# Patient Record
Sex: Male | Born: 1940 | Race: White | Hispanic: No | Marital: Married | Smoking: Former smoker
Health system: Southern US, Community
[De-identification: ages and names within clinical notes are randomized; demographics above are authoritative.]

## PROBLEM LIST (undated history)

## (undated) DIAGNOSIS — N259 Disorder resulting from impaired renal tubular function, unspecified: Secondary | ICD-10-CM

## (undated) DIAGNOSIS — C801 Malignant (primary) neoplasm, unspecified: Secondary | ICD-10-CM

## (undated) DIAGNOSIS — I6350 Cerebral infarction due to unspecified occlusion or stenosis of unspecified cerebral artery: Secondary | ICD-10-CM

## (undated) DIAGNOSIS — E785 Hyperlipidemia, unspecified: Secondary | ICD-10-CM

## (undated) DIAGNOSIS — I679 Cerebrovascular disease, unspecified: Secondary | ICD-10-CM

## (undated) DIAGNOSIS — I2510 Atherosclerotic heart disease of native coronary artery without angina pectoris: Secondary | ICD-10-CM

## (undated) DIAGNOSIS — I10 Essential (primary) hypertension: Secondary | ICD-10-CM

## (undated) DIAGNOSIS — I498 Other specified cardiac arrhythmias: Secondary | ICD-10-CM

## (undated) DIAGNOSIS — E119 Type 2 diabetes mellitus without complications: Secondary | ICD-10-CM

## (undated) DIAGNOSIS — I219 Acute myocardial infarction, unspecified: Secondary | ICD-10-CM

## (undated) HISTORY — DX: Acute myocardial infarction, unspecified: I21.9

## (undated) HISTORY — DX: Hyperlipidemia, unspecified: E78.5

## (undated) HISTORY — DX: Atherosclerotic heart disease of native coronary artery without angina pectoris: I25.10

## (undated) HISTORY — DX: Type 2 diabetes mellitus without complications: E11.9

## (undated) HISTORY — DX: Malignant (primary) neoplasm, unspecified: C80.1

## (undated) HISTORY — DX: Disorder resulting from impaired renal tubular function, unspecified: N25.9

## (undated) HISTORY — DX: Cerebrovascular disease, unspecified: I67.9

## (undated) HISTORY — PX: CORONARY STENT PLACEMENT: SHX1402

## (undated) HISTORY — DX: Other specified cardiac arrhythmias: I49.8

## (undated) HISTORY — DX: Essential (primary) hypertension: I10

## (undated) HISTORY — DX: Cerebral infarction due to unspecified occlusion or stenosis of unspecified cerebral artery: I63.50

---

## 2000-12-17 ENCOUNTER — Inpatient Hospital Stay (HOSPITAL_COMMUNITY): Admission: EM | Admit: 2000-12-17 | Discharge: 2000-12-23 | Payer: Self-pay | Admitting: Emergency Medicine

## 2000-12-17 ENCOUNTER — Encounter: Payer: Self-pay | Admitting: Emergency Medicine

## 2006-04-21 ENCOUNTER — Encounter (INDEPENDENT_AMBULATORY_CARE_PROVIDER_SITE_OTHER): Payer: Self-pay | Admitting: *Deleted

## 2006-04-21 ENCOUNTER — Ambulatory Visit (HOSPITAL_COMMUNITY): Admission: RE | Admit: 2006-04-21 | Discharge: 2006-04-21 | Payer: Self-pay | Admitting: Gastroenterology

## 2006-11-19 ENCOUNTER — Inpatient Hospital Stay (HOSPITAL_COMMUNITY): Admission: EM | Admit: 2006-11-19 | Discharge: 2006-11-24 | Payer: Self-pay | Admitting: Emergency Medicine

## 2006-11-20 ENCOUNTER — Encounter: Payer: Self-pay | Admitting: Cardiology

## 2006-11-20 ENCOUNTER — Ambulatory Visit: Payer: Self-pay | Admitting: Cardiology

## 2006-11-20 ENCOUNTER — Ambulatory Visit: Payer: Self-pay | Admitting: Vascular Surgery

## 2006-11-20 ENCOUNTER — Encounter (INDEPENDENT_AMBULATORY_CARE_PROVIDER_SITE_OTHER): Payer: Self-pay | Admitting: Neurology

## 2006-11-23 ENCOUNTER — Ambulatory Visit: Payer: Self-pay | Admitting: Physical Medicine & Rehabilitation

## 2006-12-18 ENCOUNTER — Encounter: Admission: RE | Admit: 2006-12-18 | Discharge: 2006-12-30 | Payer: Self-pay | Admitting: Neurology

## 2006-12-28 ENCOUNTER — Ambulatory Visit: Payer: Self-pay | Admitting: Cardiology

## 2007-01-04 ENCOUNTER — Ambulatory Visit: Payer: Self-pay

## 2007-07-14 ENCOUNTER — Ambulatory Visit: Payer: Self-pay | Admitting: Cardiology

## 2007-08-06 ENCOUNTER — Ambulatory Visit: Payer: Self-pay

## 2007-08-06 ENCOUNTER — Ambulatory Visit: Payer: Self-pay | Admitting: Cardiology

## 2007-08-06 LAB — CONVERTED CEMR LAB
ALT: 23 units/L (ref 0–53)
Alkaline Phosphatase: 44 units/L (ref 39–117)
Cholesterol: 175 mg/dL (ref 0–200)
Total Bilirubin: 0.6 mg/dL (ref 0.3–1.2)
Total CHOL/HDL Ratio: 5.5
VLDL: 47 mg/dL — ABNORMAL HIGH (ref 0–40)

## 2007-11-25 ENCOUNTER — Inpatient Hospital Stay (HOSPITAL_COMMUNITY): Admission: EM | Admit: 2007-11-25 | Discharge: 2007-11-28 | Payer: Self-pay | Admitting: Emergency Medicine

## 2007-11-25 ENCOUNTER — Ambulatory Visit: Payer: Self-pay | Admitting: Cardiology

## 2007-12-08 ENCOUNTER — Encounter: Admission: RE | Admit: 2007-12-08 | Discharge: 2007-12-08 | Payer: Self-pay | Admitting: Cardiology

## 2007-12-13 ENCOUNTER — Ambulatory Visit: Payer: Self-pay | Admitting: Cardiology

## 2008-01-11 ENCOUNTER — Ambulatory Visit: Payer: Self-pay

## 2008-01-11 ENCOUNTER — Ambulatory Visit: Payer: Self-pay | Admitting: Cardiology

## 2008-01-11 LAB — CONVERTED CEMR LAB
ALT: 27 units/L (ref 0–53)
AST: 23 units/L (ref 0–37)
Alkaline Phosphatase: 65 units/L (ref 39–117)
Bilirubin, Direct: 0.1 mg/dL (ref 0.0–0.3)
HDL: 23.5 mg/dL — ABNORMAL LOW (ref >39.0)
Total Bilirubin: 0.7 mg/dL (ref 0.3–1.2)

## 2008-06-09 ENCOUNTER — Ambulatory Visit: Payer: Self-pay | Admitting: Cardiology

## 2008-12-11 DIAGNOSIS — R0989 Other specified symptoms and signs involving the circulatory and respiratory systems: Secondary | ICD-10-CM

## 2008-12-11 DIAGNOSIS — I200 Unstable angina: Secondary | ICD-10-CM

## 2008-12-11 DIAGNOSIS — I2510 Atherosclerotic heart disease of native coronary artery without angina pectoris: Secondary | ICD-10-CM | POA: Insufficient documentation

## 2008-12-11 DIAGNOSIS — N259 Disorder resulting from impaired renal tubular function, unspecified: Secondary | ICD-10-CM

## 2008-12-11 DIAGNOSIS — I6350 Cerebral infarction due to unspecified occlusion or stenosis of unspecified cerebral artery: Secondary | ICD-10-CM | POA: Insufficient documentation

## 2008-12-11 DIAGNOSIS — I498 Other specified cardiac arrhythmias: Secondary | ICD-10-CM

## 2008-12-11 DIAGNOSIS — I219 Acute myocardial infarction, unspecified: Secondary | ICD-10-CM | POA: Insufficient documentation

## 2008-12-11 DIAGNOSIS — R7309 Other abnormal glucose: Secondary | ICD-10-CM

## 2008-12-12 ENCOUNTER — Encounter: Payer: Self-pay | Admitting: Cardiology

## 2008-12-12 ENCOUNTER — Ambulatory Visit: Payer: Self-pay | Admitting: Cardiology

## 2008-12-12 DIAGNOSIS — R011 Cardiac murmur, unspecified: Secondary | ICD-10-CM

## 2008-12-27 ENCOUNTER — Ambulatory Visit: Payer: Self-pay | Admitting: Cardiology

## 2008-12-27 ENCOUNTER — Ambulatory Visit: Payer: Self-pay

## 2008-12-27 ENCOUNTER — Encounter (INDEPENDENT_AMBULATORY_CARE_PROVIDER_SITE_OTHER): Payer: Self-pay | Admitting: *Deleted

## 2008-12-27 ENCOUNTER — Encounter: Payer: Self-pay | Admitting: Cardiology

## 2009-12-10 ENCOUNTER — Telehealth: Payer: Self-pay | Admitting: Cardiology

## 2009-12-31 ENCOUNTER — Ambulatory Visit: Payer: Self-pay | Admitting: Cardiology

## 2009-12-31 DIAGNOSIS — I679 Cerebrovascular disease, unspecified: Secondary | ICD-10-CM

## 2010-01-24 ENCOUNTER — Ambulatory Visit: Payer: Self-pay | Admitting: Cardiology

## 2010-01-24 ENCOUNTER — Ambulatory Visit: Payer: Self-pay

## 2010-01-25 ENCOUNTER — Encounter (INDEPENDENT_AMBULATORY_CARE_PROVIDER_SITE_OTHER): Payer: Self-pay | Admitting: *Deleted

## 2010-04-09 ENCOUNTER — Telehealth: Payer: Self-pay | Admitting: Cardiology

## 2010-09-15 ENCOUNTER — Encounter: Payer: Self-pay | Admitting: Neurology

## 2010-09-22 LAB — CONVERTED CEMR LAB
Albumin: 3.7 g/dL (ref 3.5–5.2)
Alkaline Phosphatase: 69 units/L (ref 39–117)
BUN: 32 mg/dL — ABNORMAL HIGH (ref 6–23)
Bilirubin, Direct: 0.1 mg/dL (ref 0.0–0.3)
Bilirubin, Direct: 0.1 mg/dL (ref 0.0–0.3)
CO2: 26 meq/L (ref 19–32)
CO2: 27 meq/L (ref 19–32)
Chloride: 113 meq/L — ABNORMAL HIGH (ref 96–112)
Creatinine, Ser: 1.3 mg/dL (ref 0.4–1.5)
Creatinine, Ser: 1.8 mg/dL — ABNORMAL HIGH (ref 0.4–1.5)
Glucose, Bld: 214 mg/dL — ABNORMAL HIGH (ref 70–99)
Potassium: 4 meq/L (ref 3.5–5.1)
Sodium: 142 meq/L (ref 135–145)
Sodium: 146 meq/L — ABNORMAL HIGH (ref 135–145)
Total Bilirubin: 0.7 mg/dL (ref 0.3–1.2)
Total CHOL/HDL Ratio: 3
Total CHOL/HDL Ratio: 4
Total CK: 104 units/L (ref 7–232)
Total Protein: 6.6 g/dL (ref 6.0–8.3)
Total Protein: 7.2 g/dL (ref 6.0–8.3)
Triglycerides: 145 mg/dL (ref 0.0–149.0)

## 2010-09-24 NOTE — Assessment & Plan Note (Signed)
Summary: [name] [name]   Primary Provider:  Dr. Donnella Bi  CC:  no complaints.  History of Present Illness: 70 year old male  who has a history of coronary artery  disease, SVT, hypertension, and hyperlipidemia. Last cardiac catheterization in April of 2009; at that time he was found to have a 40% mid LAD, 70% ostial OM and a 90% stenosis in the mid right coronary artery followed by a 70% stenosis in the distal right coronary artery. Patient had drug-eluting stents to both lesions in the right coronary artery successfully. Last echocardiogram in May of 2010 and showed normal LV function. Carotid Dopplers in May of 2010 showed 40-59% bilateral stenosis. Followup was recommended in one year. Since I last saw him in April of 2010 he has dyspnea with more extreme activities but not with routine activities. Relieved promptly with rest. It is not associated with chest pain. The patient is not having exertional chest pain. There is no orthopnea, PND; mild pedal edema. He has not had syncope.  Current Medications (verified): 1)  Amlodipine Besylate 5 Mg Tabs (Amlodipine Besylate) .... Take 1 Tablet By Mouth Once A Day 2)  Niaspan 1000 Mg Cr-Tabs (Niacin (Antihyperlipidemic)) .Marland Kitchen.. 1 Tab By Mouth Once Daily 3)  Simvastatin 80 Mg Tabs (Simvastatin) .... Take 1 Tablet By Mouth Once A Day 4)  Metoprolol Succinate 50 Mg Xr24h-Tab (Metoprolol Succinate) .... Take One Tablet By Mouth Daily 5)  Plavix 75 Mg Tabs (Clopidogrel Bisulfate) .... Take One Tablet By Mouth Daily 6)  Glipizide 5 Mg Tabs (Glipizide) .Marland Kitchen.. 1 Tab By Mouth Once Daily 7)  Aspirin 81 Mg Tbec (Aspirin) .... Take One Tablet By Mouth Daily 8)  Lantus 25units .... At Bedtime  Past History:  Past Medical History: RENAL INSUFFICIENCY (ICD-588.9) DIABETES MELLITUS (ICD-250.00) cerebrovascular disease CEREBROVASCULAR ACCIDENT (ICD-434.91) HYPERLIPIDEMIA (ICD-272.4) HYPERTENSION (ICD-401.9) SUPRAVENTRICULAR TACHYCARDIA (ICD-427.89) CAD (ICD-414.00)  Past  Surgical History: Reviewed history from 12/11/2008 and no changes required. Status post drug-eluting stents to the mid and distal right coronary artery by Dr. Juanda Chance.   Social History: Reviewed history from 12/11/2008 and no changes required.  Lives with his wife (wife's number is [phone]).  Chewing tobacco. Married   Review of Systems       Complaines of fatigue and pain in legs both with ambulation and at rest but no fevers or chills, productive cough, hemoptysis, dysphasia, odynophagia, melena, hematochezia, dysuria, hematuria, rash, seizure activity, orthopnea, PND,  claudication. Remaining systems are negative.   Vital Signs:  Patient profile:   70 year old male Height:      73 inches Weight:      202 pounds BMI:     26.75 Pulse rate:   69 / minute Resp:     14 per minute BP sitting:   142 / 80  (left arm)  Vitals Entered By: Kem Parkinson (Dec 31, 2009 3:21 PM)  Physical Exam  General:  Well-developed well-nourished in no acute distress.  Skin is warm and dry.  HEENT is normal.  Neck is supple. No thyromegaly.  Chest is clear to auscultation with normal expansion.  Cardiovascular exam is regular rate and rhythm. 2/6 systolic ejection murmur Abdominal exam nontender or distended. No masses palpated. Extremities show trace edema. neuro grossly intact    EKG  Procedure date:  12/31/2009  Findings:      Sinus rhythm at a rate of 69. Axis normal. No ST changes.  Impression & Recommendations:  Problem # 1:  CEREBROVASCULAR DISEASE (ICD-437.9) Continue aspirin and statin. Repeat  carotid Dopplers.  Problem # 2:  RENAL INSUFFICIENCY (ICD-588.9) Renal function monitored by primary care.  Problem # 3:  DIABETES MELLITUS (ICD-250.00)  His updated medication list for this problem includes:    Glipizide 5 Mg Tabs (Glipizide) .Marland Kitchen... 1 tab by mouth once daily    Aspirin 81 Mg Tbec (Aspirin) .Marland Kitchen... Take one tablet by mouth daily  Problem # 4:  HYPERLIPIDEMIA  (ICD-272.4) Continue present medications. Check lipids, liver and CK. His updated medication list for this problem includes:    Niaspan 1000 Mg Cr-tabs (Niacin (antihyperlipidemic)) .Marland Kitchen... 1 tab by mouth once daily    Simvastatin 80 Mg Tabs (Simvastatin) .Marland Kitchen... Take 1 tablet by mouth once a day  Problem # 5:  HYPERTENSION (ICD-401.9) Blood pressure mildly elevated. I've asked him to check his blood pressure at home. We will increase amlodipine if systolic is greater than 130 or diastolic greater than 85. His updated medication list for this problem includes:    Amlodipine Besylate 5 Mg Tabs (Amlodipine besylate) .Marland Kitchen... Take 1 tablet by mouth once a day    Metoprolol Succinate 50 Mg Xr24h-tab (Metoprolol succinate) .Marland Kitchen... Take one tablet by mouth daily    Aspirin 81 Mg Tbec (Aspirin) .Marland Kitchen... Take one tablet by mouth daily  Problem # 6:  CAD (ICD-414.00) Continue aspirin, Plavix, beta blocker and statin. His updated medication list for this problem includes:    Amlodipine Besylate 5 Mg Tabs (Amlodipine besylate) .Marland Kitchen... Take 1 tablet by mouth once a day    Metoprolol Succinate 50 Mg Xr24h-tab (Metoprolol succinate) .Marland Kitchen... Take one tablet by mouth daily    Plavix 75 Mg Tabs (Clopidogrel bisulfate) .Marland Kitchen... Take one tablet by mouth daily    Aspirin 81 Mg Tbec (Aspirin) .Marland Kitchen... Take one tablet by mouth daily  Problem # 7:  SUPRAVENTRICULAR TACHYCARDIA (ICD-427.89) No recurrences. Continue beta blocker. His updated medication list for this problem includes:    Amlodipine Besylate 5 Mg Tabs (Amlodipine besylate) .Marland Kitchen... Take 1 tablet by mouth once a day    Metoprolol Succinate 50 Mg Xr24h-tab (Metoprolol succinate) .Marland Kitchen... Take one tablet by mouth daily    Plavix 75 Mg Tabs (Clopidogrel bisulfate) .Marland Kitchen... Take one tablet by mouth daily    Aspirin 81 Mg Tbec (Aspirin) .Marland Kitchen... Take one tablet by mouth daily  Other Orders: Carotid Duplex (Carotid Duplex)  Patient Instructions: 1)  Your physician recommends that you  schedule a follow-up appointment in:ONE YEAR 2)  Your physician recommends that you return for lab work EA:VWUJ CAROTIDS- BMP/LIPID/LIVER/TOTAL CK-272.0/401.1/V58.69 3)  Your physician has requested that you have a carotid duplex. This test is an ultrasound of the carotid arteries in your neck. It looks at blood flow through these arteries that supply the brain with blood. Allow one hour for this exam. There are no restrictions or special instructions.

## 2010-09-24 NOTE — Letter (Signed)
Summary: Custom - Lipid  [facility], Main Office  [address]   [place], [place] [address]   Phone: [phone]  Fax: [phone]     January 25, 2010 MRN: [id number]   HISAO DOO [address] [place], [place]  [address]   Dear [name] [name],  We have reviewed your cholesterol results.  They are as follows:     Total Cholesterol:    138 (Desirable: less than 200)       HDL  Cholesterol:     36.20  (Desirable: greater than 40 for men and 50 for women)       LDL Cholesterol:       59.0  (Desirable: less than 100 for low risk and less than 70 for moderate to high risk)       Triglycerides:       321.0  (Desirable: less than 150)  Our recommendations include:These numbers look good. Continue on the same medicine. Sodium, potassium, kidney and Liver function are stable. Take care, Dr. Darel Hong.    Call our office at the number listed above if you have any questions.  Lowering your LDL cholesterol is important, but it is only one of a large number of "risk factors" that may indicate that you are at risk for heart disease, stroke or other complications of hardening of the arteries.  Other risk factors include:   A.  Cigarette Smoking* B.  High Blood Pressure* C.  Obesity* D.   Low HDL Cholesterol (see yours above)* E.   Diabetes Mellitus (higher risk if your is uncontrolled) F.  Family history of premature heart disease G.  Previous history of stroke or cardiovascular disease    *These are risk factors YOU HAVE CONTROL OVER.  For more information, visit .  There is now evidence that lowering the TOTAL CHOLESTEROL AND LDL CHOLESTEROL can reduce the risk of heart disease.  The American Heart Association recommends the following guidelines for the treatment of elevated cholesterol:  1.  If there is now current heart disease and less than two risk factors, TOTAL CHOLESTEROL should be less than 200 and LDL CHOLESTEROL should be less than 100. 2.  If there is  current heart disease or two or more risk factors, TOTAL CHOLESTEROL should be less than 200 and LDL CHOLESTEROL should be less than 70.  A diet low in cholesterol, saturated fat, and calories is the cornerstone of treatment for elevated cholesterol.  Cessation of smoking and exercise are also important in the management of elevated cholesterol and preventing vascular disease.  Studies have shown that 30 to 60 minutes of physical activity most days can help lower blood pressure, lower cholesterol, and keep your weight at a healthy level.  Drug therapy is used when cholesterol levels do not respond to therapeutic lifestyle changes (smoking cessation, diet, and exercise) and remains unacceptably high.  If medication is started, it is important to have you levels checked periodically to evaluate the need for further treatment options.  Thank you,    [employer] Team

## 2010-09-24 NOTE — Progress Notes (Signed)
Summary: drug reaction with simvastin - amlopidine   Phone Note Refill Request Message from:  Pharmacy on April 09, 2010 10:35 AM  Refills Requested: Medication #1:  SIMVASTATIN 80 MG TABS Take 1 tablet by mouth once a day  Method Requested: Fax to [facility] Initial call taken by: Lorne Skeens,  April 09, 2010 10:36 AM Caller: [facility]  Request: Speak with Nurse Summary of Call: Narka , [phone] / ref # [id number] / this is a drug reaction with AMLODIPINE BESYLATE 5 MG TABS Take 1 tablet by mouth once a day  Follow-up for Phone Call        will foward for dr Jens Som review Deliah Goody, RN  April 09, 2010 11:11 AM   Additional Follow-up for Phone Call Additional follow up Details #1::        dc zocor; pravachol 80 mg by mouth daily; lipids and liver in six weeks Ferman Hamming, MD, [facility]  April 09, 2010 11:31 AM  new script phoned to [facility], pt aware of change and need for repeat labs Deliah Goody, RN  April 09, 2010 11:47 AM     New/Updated Medications: PRAVASTATIN SODIUM 80 MG TABS (PRAVASTATIN SODIUM) Take one tablet by mouth daily at bedtime

## 2010-09-24 NOTE — Progress Notes (Signed)
Summary: refill  Phone Note Refill Request Message from:  Patient on December 10, 2009 9:22 AM  Refills Requested: Medication #1:  NIASPAN 1000 MG CR-TABS 1 tab by mouth once daily Send to [facility] [phone]  Initial call taken by: Judie Grieve,  December 10, 2009 9:23 AM    Prescriptions: NIASPAN 1000 MG CR-TABS (NIACIN (ANTIHYPERLIPIDEMIC)) 1 tab by mouth once daily  #90 x 3   Entered by:   Kem Parkinson   Authorized by:   Ferman Hamming, MD, [facility]   Signed by:   Kem Parkinson on 12/10/2009   Method used:   Electronically to        [facility]* (mail-order)             ,          Ph: [phone]       Fax: [phone]   RxID:   [id number]

## 2011-01-07 NOTE — Discharge Summary (Signed)
NAME:  [name] [name], [name] [name] NO.:  [id number]   MEDICAL RECORD NO.:  [id number]          PATIENT TYPE:  INP   LOCATION:  4735                         FACILITY:  [facility]   PHYSICIAN:  Bevelyn Buckles. Bensimhon, MDDATE OF BIRTH:  [birth year]   DATE OF ADMISSION:  11/25/2007  DATE OF DISCHARGE:  11/28/2007                         DISCHARGE SUMMARY - REFERRING   PRIMARY CARE PHYSICIAN:  Dr. Electa Sniff   DISCHARGE DIAGNOSES:  1. Acute coronary syndrome.  2. Progressive coronary artery disease.  3. Status post drug-eluting stents to the mid and distal right      coronary artery by Dr. Juanda Chance.  4. Hypertension.  5. Hyperglycemia with a history of diabetes, poorly controlled.  6. Chronic renal insufficiency, appears to be stable post-      catheterization.  7. Tobacco use.  8. Hyperlipidemia history as noted per past medical history.   PROCEDURES:  Cardiac catheterization with drug-eluting stenting  performed to the mid and distal RCA by Dr. Charlies Constable on November 26, 2007.   BRIEF HISTORY:  [name] [name] is a 70 year old white male who presented with  anterior chest pressure associated with shortness of breath that started  shortly after dinner on the evening of admission.  He gave an 8 on a  scale of 0-10.  He took his neighbor's nitroglycerin, but it did not  improve the discomfort, thus he came to the emergency room for further  evaluation.   PAST MEDICAL HISTORY:  1. Diabetes.  2. Hypertension.  3. Hyperlipidemia.  4. Chewing tobacco.  5. History of coronary artery disease with a bare metal stent to the      circumflex in 2002 and PTCA to the OM with Rotablator and cutting      balloon, also in 2002.   ALLERGIES:  PENICILLIN.   LABORATORY DATA:  Admission weight was 91 kg.  On admission, H&H was  12.8 and 37.3, normal indices, platelets 193, WBCs 13.6.  On the 4th,  H&H was 11.1 and 32.3, normal indices, platelets 202, WBCs 10.1.  PTT  was 91 on the 3rd, PT 13.6.  On the  3rd, sodium was 139, potassium 3.9,  BUN 23, creatinine 1.64, glucose 218.  At the time of discharge on the  5th, sodium was 139, potassium 4.0, BUN 19, creatinine 1.90, glucose  219.  CK total was 181 with MB of 16.8 and a troponin of 0.78  postprocedure.   Chest x-ray on the 2nd showed no active disease.  EKG showed sinus  rhythm with septal Q waves and no acute changes.   HOSPITAL COURSE:  The patient was admitted by Dr. Tawanna Cooler.  It is noted Dr.  Nelida Meuse H&P dictation is pending at the time of this dictation.  Dr.  Jens Som saw the patient on the 3rd, and he had not had any further  chest discomfort.  He underwent cardiac catheterization by Dr. Juanda Chance  with drug-eluting stents placed to the mid and distal RCA.  It was noted  that the circumflex stent had less than 10% restenosis.  Postprocedure,  the patient's troponins were slightly elevated, and Dr. Jens Som  wanted  to keep him for another 24 hours for observation.  Medications were  adjusted.  Wife phoned in correct medication list.  Cardiac rehab  assisted with ambulation and education.  By the 5th, the patient was  ambulating and doing well, and it was felt that he could be discharged  home.   DISPOSITION:  The patient is discharged home.  He is asked to maintain a  low-sodium heart-healthy ADA diet.   WOUND CARE AND ACTIVITIES:  Per supplemental discharge sheet.   He was given a new prescription for nitroglycerin 0.4 as needed and  Lipitor 80 mg q.h.s.  He was asked to continue Plavix 75 mg daily,  aspirin 325 mg daily, metoprolol 50 mg daily, and glipizide 5 mg daily.  He was advised not to take his fenofibrate.  He is also asked to check  his sugars before meals and at bedtime and make a 1-2 week followup  appointment with his primary care physician, given his poorly-controlled  sugars.  He will also need blood work in 6-8 weeks in regards to FLP and  LFTs since Lipitor was initiated.  He was advised no smoking or tobacco   products and to bring all medications to all appointments.  Our office  will call him with a followup appointment.   TIME SPENT AT DISCHARGE:  45 minutes.      Joellyn Rued, PA-C      Bevelyn Buckles. Bensimhon, MD  Electronically Signed    EW/MEDQ  D:  11/28/2007  T:  11/28/2007  Job:  [id number]   cc:   Madolyn Frieze. Jens Som, MD, [facility]  Dr. Electa Sniff

## 2011-01-07 NOTE — Assessment & Plan Note (Signed)
[facility]                            CARDIOLOGY OFFICE NOTE   [name], [name]                        MRN:          [id number]  DATE:12/13/2007                            DOB:          23-Mar-1941    [name] [name] is a pleasant gentleman who is 70 years old who has a history  of coronary disease, SVT, hypertension, and hyperlipidemia.  He was  recently admitted to [facility] with chest pain.  He ruled in  for myocardial infarction with serial enzymes.  He underwent cardiac  catheterization by Dr. Juanda Chance.  At that time, he was found to have a  normal left main.  There was a 40% LAD after the second diagonal.  There  was an ostial 70% large obtuse marginal and a 40% circumflex.  The stent  in the right coronary artery was patent.  However, there was a 90% mid  lesion and a 70% distal lesion.  The patient had drug-eluting stents to  the right coronary artery lesions at that time.  Since then, he has not  had chest pain, shortness of breath, palpitations, or syncope.   MEDICATIONS:  1. Plavix 75 mg daily.  2. Multivitamin daily.  3. Toprol-XL 50 mg daily.  4. Lipitor 80 mg daily.  5. Glipizide 10 mg p.o. b.i.d.  6. Aspirin 325 mg p.o. daily.   PHYSICAL EXAMINATION:  VITAL SIGNS:  Blood pressure 128/66, pulse 84.  Weight is 200 pounds.  HEENT:  Normal.  NECK:  Supple.  There is a left carotid bruit.  CHEST:  Clear.  CARDIOVASCULAR:  Regular rate and rhythm.  There is a 2/6 systolic  murmur at the left sternal border.  ABDOMEN:  No tenderness.  Right groin shows no hematoma, no bruit.  EXTREMITIES:  No edema.   His electrocardiogram today shows sinus rhythm at a rate of 71.  The  axis is normal.  There is lateral T wave inversion.   DIAGNOSES:  1. Coronary artery disease status post recent drug-eluting stents to      the right coronary artery.  We will continue his aspirin, Plavix,      beta blocker, and statin.  2. History of embolic  cerebrovascular accident with negative workup.  3. Hypertension.  His blood pressure is adequately controlled on his      present medications.  4. Hyperlipidemia.  He will continue on his recently initiated      Lipitor.  He will return in 4 weeks, and we will check lipids and      liver at that time and adjust as indicated.  5. Left carotid bruit.  He will need carotid Dopplers as well, and we      will try to arrange these on the same day.  6. History of supraventricular tachycardia.  He will continue on his      beta blocker.  7. Diabetes mellitus.  Per primary care physician.   We will see him back in 6 months.     Madolyn Frieze Jens Som, MD, [facility]  Electronically Signed    BSC/MedQ  DD: 12/13/2007  DT: 12/13/2007  Job #: [id number]

## 2011-01-07 NOTE — Cardiovascular Report (Signed)
NAME:  CHILD, CAMPOY NO.:  [id number]   MEDICAL RECORD NO.:  [id number]          PATIENT TYPE:  INP   LOCATION:  6527                         FACILITY:  [facility]   PHYSICIAN:  Everardo Beals. Juanda Chance, MD, FACCDATE OF BIRTH:  Dec 21, 1940   DATE OF PROCEDURE:  11/26/2007  DATE OF DISCHARGE:                            CARDIAC CATHETERIZATION   CLINICAL HISTORY:  Mr. Morten is 70 years old and has had prior stenting  of the proximal circumflex artery with rotational atherectomy with a  [employer]tronic AVE stent in 2002.  He was recently admitted with chest pain  and positive enzymes consistent with a non-ST-elevation myocardial  infarction.  He also has a history of diabetes, previous stroke and  hypertension and renal insufficiency.   PROCEDURE IN DETAIL:  The procedure was performed via the right femoral  arterial sheath and 6 French preformed coronary catheters.  A front wall  arterial puncture with Omnipaque contrast was used.  At completion of  diagnostic study made decision to proceed with intervention on the  tandem lesions in the mid and distal right coronary artery.  We did not  do a left ventricular angiogram because of a creatinine of 1.6.   The patient was given Angiomax bolus infusion and was given an  additional 300 mg of Plavix and had already received four chewable  aspirin.  We used a 6 [place] JR-4 guiding catheter with side holes.  We  passed a Prowater wire down the vessel without too much difficulty.  We  first predilated the lesion in the distal right coronary with a 2.25 x  50 mm Maverick.  We were unable to pass a 2.5 x 50 mm Promus stent.  We  then dilated with a 2.5 x 50 mm Maverick up to 10 atmospheres.  Once  again we were unable to pass the Promus stent down to the lesion.  We  then passed a second Prowater wire to use as a buddy wire and with the  help of this we were able to advance the stent to the proper position.  We deployed the stent with one  inflation of 12 atmospheres for 30  seconds.  We then removed the buddy wire.  We then postdilated with a  2.75 x 12 mm Pickens Voyager performing one inflation up to 18 atmospheres  for 30 seconds.   We then approached the lesion in the mid right coronary artery.  We had  previously dilated this with the 2.25 x 50 mm Maverick.  We deployed a  3.0 x 23 mm stent and deployed this with one inflation up to 30 seconds  for 15 atmospheres for 30 seconds.  We then postdilated the distal  portion of the stent with a 3.25 x 50 mm Quantum Maverick performing one  inflation up to 18 atmospheres for 30 seconds.  We then postdilated the  proximal aspect of the stent with a 3.5 x 50 mm [place] Voyager performing  one inflation up to 18 atmospheres for 30 seconds.  Final diagnostics  was then performed through a guiding catheter.  The mid right femoral  was closed with Angio-Seal at the end of the procedure.  The patient  tolerated the procedure well and left the laboratory in satisfactory  condition.   RESULTS:  Left main coronary artery.  The left main coronary artery was  free of disease.   Left anterior descending artery.  The left anterior descending artery  gave rise to two septal perforators and three diagonal branches.  The  LAD was irregular and there was a 40% narrowing in the midvessel after  the second diagonal branch.   Circumflex artery.  The circumflex artery is a moderate sized vessel and  gave rise to an atrial branch, a small marginal branch, a large marginal  branch and two posterolateral branches.  There was 70% ostial stenosis  in the large marginal branch.  There was 40% narrowing in the distal  circumflex artery.   Right coronary artery.  The stent in the mid right coronary artery which  crossed the first large marginal branch had less than 10% stenosis.   The right coronary was a heavily calcified vessel and gave rise to a  right ventricle branch, posterior descending branch and  three small  posterolateral branches.  There was a long segmental disease in the  midvessel with 90% focal narrowing.  There was also a 70% narrowing in  the distal vessel with some segmental disease.   No left ventriculogram was performed.   Following stenting of the lesion the distal right carotid stenosis  improved from 70% to 0%.   Following stenting the lesion in the mid right coronary stenosis  improved from 90% to 0%.   CONCLUSION:  1. Coronary artery disease status post prior percutaneous coronary      intervention with 40% narrowing in the mid left anterior descending      artery, less than 10% stenosis at the stent site in the mid      circumflex artery, 70% ostial stenosis in the large marginal branch      of the circumflex artery (jailed by the stent) and 40% narrowing in      the distal circumflex artery, 90% stenosis in the mid right      coronary artery and 70% stenosis in the distal right coronary      artery.  2. Successful PCI of tandem lesions in the distal and mid right      coronary artery with improvement in distal stenosis from 70% to 0%      using a Promus drug eluting stent and improvement in the mid      stenosis from 90% to 0% using a Promus drug eluting stent.   DISPOSITION:  The patient was returned for further observation.      Bruce Elvera Lennox Juanda Chance, MD, [facility]  Electronically Signed     BRB/MEDQ  D:  11/26/2007  T:  11/26/2007  Job:  [id number]   cc:   Madolyn Frieze. Jens Som, MD, [facility]

## 2011-01-07 NOTE — Assessment & Plan Note (Signed)
[facility] HEALTHCARE                            CARDIOLOGY OFFICE NOTE   [name] [name], [name] [name]                        MRN:          [id number]  DATE:06/09/2008                            DOB:          June 08, 1941    [name] [name] is a pleasant gentleman who has a history of coronary artery  disease, SVT, hypertension, and hyperlipidemia.  Back in April, the  patient ruled in for a non-ST elevation myocardial infarction.  He  underwent cardiac catheterization at that time.  He was found to have a  70% ostial stenosis and a large marginal.  There was a 90% focal  stenosis in the right coronary artery and a 70% stenosis in the distal.  He had PCI of the right coronary artery at that time.  Since then, he  has done well.  He denies any dyspnea, chest pain, palpitations, or  syncope.  There is no pedal edema.   His medications include:  1. Plavix 75 mg p.o. daily.  2. Multivitamin daily.  3. Toprol 50 mg p.o. daily.  4. Lipitor 80 mg p.o. daily.  5. Glipizide 10 mg p.o. b.i.d.  6. Aspirin 325 mg p.o. daily.  7. Niaspan 1 g p.o. daily.  8. Insulin.   PHYSICAL EXAMINATION:  VITAL SIGNS:  Blood pressure of 140/80 and his  pulse of 61.  He weighs 201 pounds.  HEENT:  Normal.  NECK:  Supple.  CHEST:  Clear.  CARDIOVASCULAR:  Regular rate and rhythm.  There is 2/6 systolic murmur  at the left sternal border.  ABDOMEN:  No tenderness.  EXTREMITIES:  No edema.   His electrocardiogram shows sinus rhythm at a rate of 61.  The axis is  normal.  There are no significant ST changes.   DIAGNOSES:  1. Coronary artery disease - [name] [name] is doing well from a      symptomatic standpoint.  He will continue on his aspirin, Plavix,      beta-blocker, and statin.  Note, his previous stents with drug      eluting.  2. History of embolic cerebrovascular accident with prior negative      workup.  3. Hypertension - his blood pressure is mildly elevated today.  I will      add  Norvasc 5 mg p.o. daily.  4. Hyperlipidemia - continue on his statin.  However, he is concerned      about the cough.  I will discontinue his Lipitor after his present      prescription expires.  We will then add Zocor 80 mg p.o. daily.  We      will check lipids and liver and a BMET 6 weeks afterwards.  5. History of left carotid bruit - he will need followup carotid      Dopplers in May 2010.  6. History of supraventricular tachycardia - he will continue on his      beta-blocker.  7. Diabetes mellitus.  8. Renal insufficiency - we will check a BMET in 6 weeks as described      above.  I will see him  back in 6 months.     Madolyn Frieze Jens Som, MD, [facility]  Electronically Signed    BSC/MedQ  DD: 06/09/2008  DT: 06/09/2008  Job #: [id number]

## 2011-01-07 NOTE — Assessment & Plan Note (Signed)
[facility] HEALTHCARE                            CARDIOLOGY OFFICE NOTE   [name] [name], [name] [name]                        MRN:          [id number]  DATE:07/14/2007                            DOB:          October 16, 1940    [name] is a gentleman who I have seen in the past for coronary  disease status post PCI of his circumflex in April of 2002.  When I last  saw him in May of this year he had complained of some vague chest pain.  We performed a Myoview on Jan 04, 2007.  This showed no scar or ischemia  and his ejection fraction was not calculated as it was not gated.  Note,  his LV function has been preserved in the past.  There was an  echocardiogram in March of this year, this showed normal LV function.  Since I last saw him he is not complaining of significant dyspnea,  orthopnea, PND, pedal edema, chest pain or syncope.  He has had some  pain in his back and also in his hips bilaterally.  He also has some  pain in his calves and feet predominantly at night.   MEDICATIONS:  1. Plavix 75 mg p.o. daily.  2. Glyburide/metformin 2.5/500 daily.  3. Toprol 50 mg p.o. daily.  4. Fenofibrate 134 mg p.o. daily.   PHYSICAL EXAM:  Today, shows a blood pressure of 126/72 and his pulse is  98.  HEENT:  Normal.  NECK:  Supple with no bruits.  CHEST:  Clear.  CARDIOVASCULAR EXAM:  Reveals a regular rate and rhythm.  ABDOMINAL EXAM:  Shows no tenderness.  He has 2+ femoral pulses  bilaterally.  EXTREMITIES:  Show no edema.  He has a 2+ dorsalis pedis on the right  and a 1+ on the left.   His electrocardiogram shows a sinus rhythm at a rate of 88.  The axis is  normal.  There are no significant ST changes.   DIAGNOSES:  1. History of coronary disease - His recent Myoview showed normal      perfusion.  We will continue with medical therapy including his      Plavix, beta-blocker and fenofibrate.  Note, he is not on a statin.      We will check lipids and liver and add a  statin, most likely, when      we have those results.  2. History of embolic cerebrovascular accident - His cardiac workup in      the hospital previously was benign.  3. Hypertension - His blood pressure is adequately controlled on his      present medications.  4. Hyperlipidemia - As per above, we will continue with his      fenofibrate.  I will most likely add a statin when I have the      results of his lipid profile, which we will check when he returns      for ankle brachial indices.  5. Lower extremity pain - This does not sound to be vascular, but we      will schedule ankle  brachial indices to ensure that there is no      significant circulatory issues.  6. History of supraventricular tachycardia - He will continue on his      beta-blocker.  7. Diabetes mellitus - Per his primary care physician.   We will see him back in approximately 6 months.     Madolyn Frieze [name] Som, MD, [facility]  Electronically Signed    BSC/MedQ  DD: 07/14/2007  DT: 07/15/2007  Job #: [id number]   cc:   [name] [name], M.D.

## 2011-01-07 NOTE — Assessment & Plan Note (Signed)
[facility] HEALTHCARE                            CARDIOLOGY OFFICE NOTE   BAER, HINTON                        MRN:          [id number]  DATE:12/28/2006                            DOB:          Jan 29, 1941    Mr. Dimock is a 70 year old gentleman who I have seen in the past for  coronary disease.  He is status post stent to his circumflex in April of  2002.  Note, he was recently admitted to [facility] with a CVA.  During that admission he apparently was found to have SVT but there was  no atrial fibrillation documented.  He was seen by Dr. Samule Ohm.  He was  treated with the addition of Plavix to his medical regimen.  Note, the  patient did have an echocardiogram that showed normal LV function and  mildly reduced cusp excursion of the aortic valve.  There was mild  mitral annular calcification.  He also apparently had a transesophageal  echocardiogram that showed no patent foramen ovale or other  cardiothromboembolic source.  He continued to be in sinus rhythm at the  time of discharge.  Since discharge he has done well.  He does not have  significant dyspnea on exertion, orthopnea, PND or pedal edema.  He has  not had any significant palpitations since discharge.  He did state that  before he was admitted he has had an uncomfortable feeling in his chest  with exertion.  He has not had that since discharge.  He finds it  difficult to describe otherwise.   MEDICATIONS AT PRESENT:  1. Plavix 75 mg p.o. daily.  2. Aspirin 81 mg p.o. daily.  3. Enterra 130 mg p.o. daily.  4. Toprol 50 mg p.o. daily.  5. Glipizide 2.5 mg p.o. b.i.d.  6. Multivitamin.  7. Vitamin B12.   His physical exam today shows a blood pressure of 128/82 and his pulse  is 81.  He weighs 200 pounds.  NECK:  Supple.  CHEST:  Clear.  CARDIOVASCULAR EXAM:  A regular rate and rhythm.  EXTREMITIES:  No edema.  ABDOMINAL EXAM:  Shows no pulsatile masses and no bruit.   His  electrocardiogram shows a sinus rhythm at a rate of 81.  There are  no ST changes noted.   DIAGNOSES:  1. History of coronary disease with vague chest pain - We will plan to      risk stratify him with a stress Myoview.  If it shows normal      perfusion then we will continue with medical therapy at this point.      He will continue on his aspirin, Plavix and beta-blocker.  He also      will continue on his Enterra.  2. Recent embolic cerebrovascular accident - Note, his cardiac workup      was benign in the hospital.  There was short runs of      supraventricular tachycardia but there was no atrial fibrillation      documented.  He will continue on his aspirin and Plavix.  3. Hypertension - His blood pressure  is controlled on his present      medications.  He may benefit from an ACE inhibitor in the future      given his history of coronary disease and diabetes mellitus.  I      will leave this to Dr. Doristine Counter.  4. Hyperlipidemia - He will continue on his Enterra.  We will have his      most recent lipids and liver forwarded to [place] for our records.  He      would benefit from a Statin long term, given his history of      coronary disease, but I will await his most recent profile.  5. Supraventricular tachycardia - We will continue with his beta-      blocker.  6. Diabetes mellitus - Per primary care.   We will see him back in 6 months.     Madolyn Frieze Jens Som, MD, [facility]  Electronically Signed    BSC/MedQ  DD: 12/28/2006  DT: 12/28/2006  Job #: [id number]   cc:   Marjory Lies, M.D.

## 2011-01-07 NOTE — H&P (Signed)
NAME:  [name] [name], [name] [name] NO.:  [id number]   MEDICAL RECORD NO.:  [id number]          PATIENT TYPE:  EMS   LOCATION:  MAJO                         FACILITY:  [facility]   PHYSICIAN:  Madolyn Frieze. Jens Som, MD, FACCDATE OF BIRTH:  Feb 27, 1941   DATE OF ADMISSION:  11/25/2007  DATE OF DISCHARGE:                              HISTORY & PHYSICAL   CHIEF COMPLAINT:  Chest pain x5 hours.   HISTORY OF PRESENT ILLNESS:  The patient is a 70 year old white male  with history of coronary artery disease (status post PCI with bare metal  stent to the left circumflex, PTCA to an OM in 2002), diabetes,  hypertension, hyperlipidemia with complaints of chest pressure that  started shortly after dinner tonight.  He described the pain as pressure  like with no radiation, some mild dyspnea associated with it, no  diaphoresis.  Pain initially was about an 8 to 10 out of 10 and is now  down to 2/10.  The patient reports that he took one of his neighbors  nitroglycerin and does not know whether or not the nitroglycerin was  expired or not and it did not resolve the pain.  He saw his primary care  Katheleen Stella who sent him to the [facility] emergency department.  The patient reports that he has had history of stable angina since his  intervention, but reports that this pain was much worse today than his  usual.   PAST MEDICAL HISTORY:  1. Coronary artery disease (status post bare metal stent to the left      circumflex in 2002, status post PTCA to the OM with Rotablator and      cutting balloon also in 2002).  2. Diabetes.  3. Hypertension.  4. Hyperlipidemia.   SOCIAL HISTORY:  Lives with his wife (wife's number is [phone]).  Negative smoking tobacco, but does chew tobacco.  No alcohol and no drug  use.   FAMILY HISTORY:  Reviewed and noncontributory to the patient's current  medical condition.   ALLERGIES:  PENICILLIN.   MEDICATIONS:  1. Plavix 75 mg daily.  2. Aspirin 81 mg  daily.  3. Glyburide/Metformin 2.5/500 mg daily.  4. Toprol XL 50 mg once a day.  5. Fenofibrate 135 mg daily.   REVIEW OF SYSTEMS:  Negative 11-point review of systems except otherwise  dictated in the above HPI.   PHYSICAL EXAMINATION:  VITAL SIGNS:  Blood pressure 154/77, heart rate  in the 80s, temperature afebrile.  GENERAL:  A well-developed, well-nourished, white male in no acute  distress.  HEENT:  Moist mucous membranes, no conjunctival pallor.  NECK:  Supple with full range of motion.  No jugular venous distention.  No carotid bruits.  CARDIOVASCULAR:  Regular rate and rhythm with no murmurs, rubs, or  gallops.  CHEST:  Clear to auscultation bilaterally with no wheezes, rales, or  rhonchi.  ABDOMEN:  Soft, nontender, and nondistended.  Normal active bowel  sounds.  EXTREMITIES:  No peripheral  edema, pulses 2+ bilaterally.  NEUROLOGY:  Nonfocal.   MEDICAL DECISION MAKING:  1. Chest x-ray demonstrates no acute  infiltrate or process.  2. EKG demonstrates normal sinus rhythm with septal cubes but no acute      ST T changes.  3. Laboratory data significant for hemoglobin of 12.9, BUN 30,      creatinine 2.1, CK 149, MB 1.7, and troponins are negative on first      set.   IMPRESSION:  1. Unstable angina.  2. Hypertension.  3. Diabetes.  4. Acute renal failure with creatinine 2.1.  5. Hyperlipidemia.   PLAN:  Admit for telemetry monitoring and rule out for myocardial  infarction.  The patient does have a more of atypical anginal story,  therefore, a left heart catheterization may be indicated in the a.m.  In  preparation for this, we will give him IV fluids overnight and Mucomyst  for renal protective effects.  Also redose his Plavix.  Start heparin  and aspirin, and give him full dose statin and beta blocker, titrate for  heart rates.  We will monitor blood pressure overnight and titrate as  needed.  We will also give him some Nitropaste and morphine as needed  for  pain.      Vernice Jefferson, MD   Electronically Signed     ______________________________  Madolyn Frieze. Jens Som, MD, [facility]    JT/MEDQ  D:  11/25/2007  T:  11/25/2007  Job:  [id number]

## 2011-01-10 NOTE — H&P (Signed)
NAME:  [name] [name], [name] [name] NO.:  [id number]   MEDICAL RECORD NO.:  [id number]          PATIENT TYPE:  EMS   LOCATION:  MAJO                         FACILITY:  [facility]   PHYSICIAN:  Genene Churn. Love, M.D.    DATE OF BIRTH:  Dec 30, 1940   DATE OF ADMISSION:  11/19/2006  DATE OF DISCHARGE:                              HISTORY & PHYSICAL   This is the second [facility] admission for this 70 year old  right-handed white married male from [place], [place], seen in  the emergency room with Code Stroke called for evaluation of right-sided  weakness.   HISTORY OF PRESENT ILLNESS:  [name] [name] can give very little history  about his medical problems.  According to Dr. Caryl Never, Dr. Mellody Life  partner at [facility], the patient has known history of  type 2 diabetes mellitus, hypertriglyceridemia, chronic renal  insufficiency, hypertension, and had a non-Q-wave MI in 2002, for which  he underwent cardiac catheterization by [facility].  He awoke at  6:00 a.m. on November 19, 2006, cooked breakfast, at and then went back to  bed about 8 o'clock.  He then awoke about 9:00 a.m. to get the telephone  and fell out of bed striking his left jaw.  He noted right-sided  weakness and was taken to [facility] as a Code Stroke to the  ER.  He had no headache, but he had some right hand and arm numbness  right-sided weakness.  He was crying, very emotional and tremors when he  arrived in the emergency room.  He denied any chest pain or  palpitations, visual loss, etc.  Initial NIH stroke scale was 5 but then  fell to about 2 by noon time.   PAST MEDICAL HISTORY:  1. Hypertension.  2. Diabetes mellitus type 2.  3. Coronary artery disease status post non-Q-wave MI.  4. 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  5. He has had hypertriglyceridemia.  6. Status post cataract surgery.   ALLERGIES:  HE HAS NOT NO KNOWN  ALLERGIES.   He does not smoke cigarettes.  He does not drink alcohol.   SOCIAL HISTORY:  He is married and I believe this is for the second  time.  He works as a Public affairs consultant at [employer] which he has  worked for approximately 1 year.   FAMILY HISTORY:  His  father died of 75+ from what sounds to have been a  myocardial infarction.  Mother died at age 71 of unknown causes.  He has  one brother who died at 10 in [place] War.  He has four children, three  sons in their 3s and one daughter in her 30s who are living and well.   His operations have included cataract surgery about 10 years ago.  He  has had diabetes mellitus for 10-12 years.   MEDICATIONS:  1. Metoprolol 50 mg daily.  2. Antara 130 mg daily.  3. Aspirin 81 mg daily.  4. Glipizide 2.5 mg b.i.d.   PHYSICAL EXAMINATION:  GENERAL APPEARANCE:  Well-developed white male  with abrasion to his left face near the jaw blood.  VITAL SIGNS:  Blood pressure right and left arm 140/80, heart was 64 and  regular.  There were no bruits.  NEUROLOGIC:  Mental status:  He is alert, oriented x3, except to month.  He followed commands.  He could name objects.  He could repeat phrases.  He could count.  He could not read.  Cranial nerve examination:  Visual  fields to be full.  Both disks were seen and flat.  The extraocular  which were full.  Corneals were present.  There was no seventh nerve  palsy.  Tongue was midline.  Uvula was midline.  Gags were present.  Sternocleidomastoid and trapezius testing was normal.  Motor examination  revealed right arm drift.  He had some weakness in his triceps initially  but then subsequently was able to hold his arm quite well.  His sensory  examination revealed decreased vibration sense and pinprick in his lower  extremities.  His deep tendon reflexes were absent at the ankles.  Plantar responses were bilaterally downgoing.  GENERAL EXAMINATION:  There were no heart murmurs.  Bowel sounds  were  normal.  There was no enlargement of the liver, spleen or kidneys.  He  was uncircumcised.  Lungs were clear.   LABORATORY DATA:  EKG showed normal sinus rhythm.   CT scan of the brain showed small vessel disease.   Capillary blood glucose was 180.   IMPRESSION:  1. Right-sided weakness with trauma, rule-out transient ischemic      attack, code 435.9 versus stroke 434.01 versus spinal cord injury      to the cervical region, code 721.41.  2. Type 2 diabetes mellitus, code  250.60.  3. Hypertriglyceridemia, code 272.4.  4. Hypertension. code 796.2.  5. 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   PLAN:  Obtain CT scan of the spine, use a collar and admitted for stroke  workup if study is negative.           ______________________________  Genene Churn. Sandria Manly, M.D.     JML/MEDQ  D:  11/19/2006  T:  11/19/2006  Job:  [id number]   cc:   Marjory Lies, M.D.

## 2011-01-10 NOTE — Cardiovascular Report (Signed)
[facility]  Patient:    [name] [name], [name] [name]                        MRN: [id number] Proc. Date: 12/18/00 Attending:  Arturo Morton. Riley Kill, M.D. [facility] CC:         Madolyn Frieze. Jens Som, M.D. [facility]  Maisie Fus D. Riley Kill, M.D. [facility]  CV Laboratory  Teena Irani. Arlyce Dice, M.D.   Cardiac Catheterization  INDICATIONS:  [name] is a delightful 70 year old, who has recently gotten married.  He presents with chest pain and has evidence of a non-Q-wave myocardial infarction.  The current study was done to access coronary anatomy.  PROCEDURES: 1. Left heart catheterization. 2. Selective coronary arteriography. 3. Selective left ventriculography.  DESCRIPTION OF PROCEDURE:  The procedure was performed from the right femoral artery using 6 French catheters.  The patient was somewhat sedated by both Benadryl and Valium, and we also gave him a milligram of Versed.  He had no major complications.  HEMODYNAMICS:  Central aortic pressure is 126/74, LV pressure 133/16.  No gradient on pullback across the aortic valve.  ANGIOGRAPHIC DATA:  The left main coronary artery is free of critical disease.  The left anterior descending artery has a fair amount of calcification.  There is mild luminal irregularities throughout but no high-grade focal stenoses. There are several diagonal branches, all of which have minimal irregularity but no critical lesions.  The circumflex was also calcified in its proximal portion.  There is a tiny first marginal branch that has insignificant abnormality.  The circumflex in its midportion bifurcates into a large marginal branch and AV circumflex and just prior to this area is a 90% stenosis that involves both branches.  Just distal to this is about 40-50% narrowing in a bifurcation point into an additional small marginal branch.  The AV circumflex is a small vessel coursing posteriorly.  The right coronary artery was also calcified.  This vessel  demonstrates calcification particularly in its midportion.  The calcification in the midportion has about 30-40% narrowing.  Distally, there is about a 50% stenosis leading into a large posterior descending branch which bifurcates distally.  LEFT VENTRICULOGRAPHY:  Ventriculography in the RAO projection reveals mid inferior hypokinesis and inferolateral hypokinesis.  The ejection fraction I&D calculated at 60.7%.  CONCLUSIONS: 1. Preserved overall left ventricular function with preserved ejection    fraction and mild wall motion abnormality involving the circumflex    distribution. 2. High-grade bifurcational stenosis of the circumflex coronary artery disease    as described in the above text. 3. Mild abnormalities of both the left anterior descending and moderate    abnormalities of the right coronary artery as described in the above    text.  The patient was too sedated to really do a procedure at this time.  We will discuss the risks, benefits and alternatives with him in detail.  I spoke with his wife, and she felt it best to discuss it with the patient.  We plan to do the procedure on Monday morning. DD:  12/19/00 TD:  12/19/00 Job: [id number]

## 2011-01-10 NOTE — Consult Note (Signed)
NAME:  [name] [name], [name] [name] NO.:  [id number]   MEDICAL RECORD NO.:  [id number]          PATIENT TYPE:  INP   LOCATION:  3023                         FACILITY:  [facility]   PHYSICIAN:  Salvadore Farber, MD  DATE OF BIRTH:  07-Apr-1941   DATE OF CONSULTATION:  11/20/2006  DATE OF DISCHARGE:                                 CONSULTATION   REASON FOR CONSULTATION:  Asked by Dr. Sandria Manly to see Mr. Haselton for narrow  complex tachycardia and to rule out cardiac source of stroke.   HISTORY OF PRESENT ILLNESS:  [name] [name] is a 70 year old gentleman with  coronary disease with preserved ejection fraction that was last assessed  in 2002.  He presented yesterday with the acute onset of right arm and  leg weakness without language difficulty.  He was treated with heparin.  MR shows multiple areas of acute stroke in both the left anterior  circulation and bilateral posterior circulation distribution.  On  monitor, he has had a narrow complex tachycardia.  He denies symptoms of  congestive heart failure and angina.   PAST MEDICAL HISTORY:  1. Status post non-ST-elevation myocardial infarction in 2002.  He was      treated with rotational atherectomy and stenting of the circumflex.  2. Diabeta mellitus.  3. Hypercholesterolemia.  4. Hypertension.  5. Tobacco abuse.  6. Chronic kidney disease.  7. Status post bilateral cataract removal.   ALLERGIES:  NO KNOWN DRUG ALLERGIES.   MEDICATIONS AT HOME:  1. Metoprolol 50 mg once daily.  2. Antara 130 mg daily.  3. Aspirin 81 mg daily.  4. Glipizide 2.5 mg daily.   MEDICATIONS HERE:  Protonix IV, heparin.   SOCIAL HISTORY:  The patient lives in [place], [place], with his  wife.  He works as a Public affairs consultant.  He cannot read and has a sixth grade  education.  He quit smoking more than 10 years ago.   FAMILY HISTORY:  Father died at 19 and mother died at 76.  Mother may  have died with myocardial infarction.  A brother died in the  [place] War.   REVIEW OF SYSTEMS:  Remarkable for distance syncope.  It is otherwise  negative in detail, except as above.   PHYSICAL EXAMINATION:  GENERAL APPEARANCE:  He is generally well-  appearing, in no distress.  Vital signs:  Heart rate 87, blood pressure 147/78, oxygen saturation of  94% on room air and temperature 98.8.  HEENT:  Normal.  SKIN:  Normal.  MUSCULOSKELETAL:  Exam is normal.  NECK:  He has no jugular venous distension, thyromegaly,  lymphadenopathy.  LUNGS:  Clear to auscultation.  CARDIOVASCULAR:  He has a nondisplaced point of maximal cardiac impulse.  There is regular rate and rhythm without murmur, rub or gallop.  ABDOMEN:  Soft, nondistended, nontender.  There is no  hepatosplenomegaly.  Bowel sounds are normal.  EXTREMITIES:  Warm without edema.  Carotid pulses 2+ bilateral without  bruit.  DP and PT pulses not palpable on either side.  NEUROLOGIC:  He is alert and oriented x3 with cranial nerves II-XII  intact.  He has 3/5 strength in the right arm and 4/5 strength in the  right leg.  Language is normal.  Neurologic exam is otherwise normal.   Electrocardiogram demonstrates normal sinus rhythm at 82 beats per  minute.  This is a normal EKG.   Monitor strips:  Multiple runs of an atrial tachycardia.  There is no  atrial fibrillation.  He had one 7-beat run of wide complex tachycardia  consistent with ventricular tachycardia at a rate of 150 beats per  minute.   IMPRESSION/RECOMMENDATIONS:  1. Potential for cardiac etiology of stroke:  Agree with      transesophageal echocardiogram to both assess his left ventricular      systolic function, bubble study to assess for a PFO and to assess      any atherosclerosis in the aortic arch.  I have arranged for this      to be done on Monday.  He should be n.p.o. Monday morning.  2. Atherosclerotic coronary disease:  Prior myocardial infarction.      Continue aspirin and beta blocker.  3. Chronic renal  insufficiency.  4. Diabetes mellitus per primary team.  5. Hypertension per primary team.      Salvadore Farber, MD  Electronically Signed     WED/MEDQ  D:  11/20/2006  T:  11/20/2006  Job:  [id number]   cc:   Madolyn Frieze. Jens Som, MD, [facility]  Marjory Lies, M.D.

## 2011-01-10 NOTE — Cardiovascular Report (Signed)
[place]  Patient:    [name] [name], [name] [name]                        MRN: [id number] Proc. Date: 12/21/00 Adm. Date:  [id number] Attending:  Junious Silk CC:         Teena Irani. Arlyce Dice, M.D.  Madolyn Frieze Jens Som, M.D. [facility]  Cardiopulmonary Laboratory   Cardiac Catheterization  PROCEDURES PERFORMED:  Percutaneous coronary intervention.  CLINICAL HISTORY:  [name] [name] has no prior history of known heart disease, but does have non-insulin-dependent diabetes.  He was admitted with chest pain and enzymes consistent with a non-Q-wave infarction study by Dr. Riley [name] on Friday and found to have a tight branch stenosis in the circumflex and circumflex marginal vessels.  He had 50% stenosis in the distal LAD, no major observation in the LAD and good LV function.  He was scheduled for intervention today.  DESCRIPTION OF PROCEDURE:  The procedure was performed via the right femoral artery using an arterial sheath and 6 French preformed coronary catheters.  A front wall arterial puncture was performed.  We initially went in with a 8 French 4.0 [place] with side holes but this was nearly occlusive of the left main coronary artery and despite the side holes caused chest pain.  For this reason we did not have available a 4.0, 7 [place] with side holes so we switched to a JL4 7 [place] with side holes.  This did not cause any problem with obstruction of the left main.  The patient was given weight-adjusted heparin to prolong the ACT to greater than 200 seconds and was given double bolus Integrilin in infusion.  He had already been on Plavix.  We crossed the lesion in the circumflex marginal vessel with a rotafloppy wire without difficulty.  We used a 1.5 bur and performed two runs across the ostial lesion in the circumflex marginal vessel at approximately 160 RPMs for 30 and 10 seconds.  Repeat diagnostic studies were then performed through the guiding catheter.   Unfortunately, this did not relieve the ostial stenosis very much.  For this reason, we elected to go in with a 2.5 x 10 mm Cutting Balloon and we performed three inflations up to 8 atmospheres for 40 seconds each.  This resulted in a nice opening of the ostium.  There was a lesion in the AV circumflex past the bifurcation that we elected to treat with a Cutting Balloon but we were unable to cross with the balloon.  We switched to a Patriot wire and crossed the lesion in the AV circumflex and dilated this with a 2.5 x 15 mm Quantum Ranger performing two inflations at 12 atmospheres for 45 seconds with a good result.  We then elected to stent the AV circumflex that crossed the marginal branch but short of the lesion that we just dilated. We used a 3.0 x 15 AVE S7 and deployed this with one inflation up to 14 atmospheres for 39 seconds.  This resulted in pinching of the side marginal branch and we elected to go back in with the 2.5 x 15 mm Quantum Ranger through the site of the stent into the marginal branch and perform two inflations of 10 atmospheres for 39 and 45 seconds.  We then went back in with a 3.0 x 15 mm Quantum Ranger in the main channel of the stent and performed one inflation of 12 atmospheres for 41 seconds.  Repeat diagnostic  studies were then performed through the guiding catheter.  This did result in some pinching off of the side branch, but it was not as bad as before and we felt it was satisfactory to leave that.  The patient did have severe chest pain after the last inflation and the final injection showed that he had developed distal embolization in the marginal branch with cutoff of the distal portion of this vessel.  Other than that, the patient tolerated the procedure well and left the laboratory in satisfactory condition.  RESULTS:  The stenosis in the AV circumflex was initially 90% and this improved to less than 10% with stenting.  The stenosis in the marginal  branch was 90% and this improved to 70% following rotational atherectomy, Cutting Balloon angioplasty, and stenting of the AV branch with subsequent PTCA through the stent.  The lesion in the AV branch, distal to the stent, was initially 70% and this improved to less than 20% with balloon angioplasty.  CONCLUSIONS:  Successful rotational atherectomy, Cutting Balloon angioplasty and stenting of a bifurcation lesion of the circumflex artery with improvement in the main channel from 90% to less than 10%, improvement in the lesion distal to the bifurcation from 70% to less than 20% and marginal improvement in the marginal branch from 90% to 70%.  DISPOSITION:  The patient was returned to the postangioplasty unit for further observation.  It is likely he will have a CK bump from his distal emolization. D:  12/21/00 TD:  12/21/00 Job: [id number]

## 2011-01-10 NOTE — Discharge Summary (Signed)
NAME:  [name] [name], [name] [name]               ACCOUNT NO.:  [id number]   MEDICAL RECORD NO.:  [id number]          PATIENT TYPE:  INP   LOCATION:  3023                         FACILITY:  [facility]   PHYSICIAN:  Pramod P. Pearlean Brownie, MD    DATE OF BIRTH:  Sep 08, 1940   DATE OF ADMISSION:  11/19/2006  DATE OF DISCHARGE:  11/24/2006                               DISCHARGE SUMMARY   ADMISSION DIAGNOSIS:  Stroke.   DISCHARGE DIAGNOSES:  1. 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  2. Hyperlipidemia.  3. Diabetes.  4. Coronary artery disease.  5. Hypertension.   HOSPITAL COURSE:  Kindly see Dr. Aida Puffer H&P on November 19, 2006, for  details of presentation.  Mr. Zurawski developed sudden onset of left-sided  weakness when he woke at about 9 a.m. on the day of admission.  He was  taken to [facility] Emergency Room where a code stroke was called.  He  was found to have right hand and arm numbness and weakness.  He was  found to be quite emotionally upset, crying.  His initial NIH stroke  scale was 5 at the time of admission, but improved quickly to an NIH  stroke scale of 2, hence he was not a candidate for aggressive  intervention.  He was admitted to the stroke unit.  He underwent  telemetry monitoring, which revealed some sinus tachycardia with some  blocks leaving concern for a cardiac source of embolism.  Dr. Randa Evens from [facility] was consulted.  Transesophageal  echocardiogram was done; however, there is no evidence of patent foramen  ovale or cardiac thromboembolism found.  He remained in sinus rhythm  throughout hospital stay.  A 2D echo showed normal ejection fraction.  Carotid ultrasound showed no hemodynamically significant stenosis.  His  hemoglobin A1c was borderline at 7.1.  Homocystine was normal at 14.5.  Total cholesterol was 159, LDL 83, HDL 34, triglycerides was elevated at  208.  Patient was initially treated with IV heparin to rule out a  cardiac source for embolism was completed and after no obvious source  was found, heparin was discontinued.  He had previously been on aspirin  prior to presentation.  He was switched to Plavix for secondary stroke  prevention.  Patient was also advised to follow up with his primary  physician, Dr. Marjory Lies, for further treatment of his elevated  triglycerides.  He was seen by physical, occupational and rehab services  for consultation, and initially he was thought to be a rehab candidate,  but he showed significant improvement in hospitalization and it was  decided he would benefit with outpatient physical and occupational  therapy.  On the day of discharge, his right upper extremity strength  had improved.  He had minimum right weakness and diminished fine finger  movements.  His right upper extremity numbness was also improving.  He  was discharged home in stable condition with instructions to undergo  outpatient physical and occupation therapy in [place], as well as see  Dr. Marjory Lies in a few weeks for management of his elevated  triglycerides and he was instructed to call Dr. Marlis Edelson office to  arrange for outpatient TCD and __________ monitoring and bubble study.   MEDICATIONS AT THE TIME OF DISCHARGE:  1. Plavix 75 mg a day.  2. Antara 130 mg a day.  3. Toprol-XL 50 mg a day.  4. Glipizide 2.5 mg twice a day.           ______________________________  Sunny Schlein. Pearlean Brownie, MD     PPS/MEDQ  D:  11/24/2006  T:  11/24/2006  Job:  [id number]   cc:   Marjory Lies, M.D.

## 2011-01-10 NOTE — Discharge Summary (Signed)
[place]  Patient:    [name] [name], [name] [name]                        MRN: [id number] Adm. Date:  [id number] Disc. Date: 12/23/00 Attending:  Junious Silk Dictator:   Tereso Newcomer, P.A. CC:         Teena Irani. Arlyce Dice, M.D.   Discharge Summary  DATE OF BIRTH:  [birth year]  DISCHARGE DIAGNOSES: 1. Non-Q-wave myocardial infarction. 2. Coronary artery disease. 3. Diabetes mellitus type 2. 4. Hypertriglyceridemia. 5. Sexual dysfunction.  PROCEDURES: 1. Cardiac catheterization by Dr. Shawnie Pons on December 18, 2000, revealing    left main free of disease, LAD with fair amount of calcification with mild    luminal irregularities throughout, but no high-grade focal stenoses.    Circumflex is calcified in the proximal portion.  The circumflex in the    midportion bifurcates into a large marginal branch and AV circumflex, and    just prior to this area there was a 90% stenosis that involved both    branches.  Distal to this there was a 40-50% narrowing.  RCA with 30-40%    midstenosis and 50% distally.  The LV-gram revealed mid inferior    hypokinesis and inferolateral hypokinesis, EF of 60.7%. 2. Percutaneous coronary intervention by Dr. Charlies Constable on December 21, 2000.    Successful rotational atherectomy, cutting balloon angioplasty, in-stenting    of the bifurcation lesion of the circumflex artery with improvement in the    main channel from 90% to less than 10%.  Improvement in the lesion distal    to the bifurcation from 70% to less than 20%.  Marginal improvement in the    marginal branch from 90% to 70%, complicated by distal embolization.  HISTORY OF PRESENT ILLNESS:  This 70 year old male with no previous cardiac history presented on December 17, 2000, with complaints of chest pain.  He had had about a month of exertional chest pain radiating to his neck, back, and left upper extremity and relieved with rest.  The patient has a history  of sexual dysfunction and was Viagra.  Of note, prior to admission he took Viagra.  Initially his exam was notable for a blood pressure of 135/70.  Neck without JVD.  Chest clear to auscultation.  Heart normal S1, S2, no murmurs. Abdomen soft and nontender.  Extremities without clubbing, cyanosis, or edema. His chest x-ray was without acute disease.  His EKG revealed normal sinus rhythm, slight T-wave inversions in V1 through V3.  He was admitted for chest pain concerning for unstable angina.  HOSPITAL COURSE:  He was placed on Lovenox, aspirin, and Lopressor. Nitroglycerin was held for a total of 24 hours after admission due to recent Viagra use.  His Glucophage was held for catheterization.  He was enrolled in the synergy trial.  His initial troponin was 0.14 and increased to 0.45.  He underwent cardiac catheterization on December 18, 2000, by Dr. Shawnie Pons. The results are noted above.  He was kept in the hospital through the weekend. His lipid profile revealed triglycerides level of 473, total cholesterol of 183, HDL 33, and LDL was not calculated.  He was started on Lopid.  His beta blocker and ACE inhibitor were adjusted.  On Monday, December 21, 2000, he underwent percutaneous coronary intervention by Dr. Charlies Constable.  The results are noted above.  The procedure was complicated by distal embolization.  The patients postprocedure  CK was 166, CK-MB was 26.2, and this rose to 189 and 29.3 respectively.  The patient was kept an extra day.  He continued to do well, without further chest pain or shortness of breath.  On the morning of Dec 23, 2000, he was found to be in stable condition and felt ready for discharge to home.  LABORATORY DATA:  White blood cell count on December 22, 2000, 13.4, hemoglobin 14.4, hematocrit 41.5, platelet count 231,000.  On December 22, 2000, sodium 136, potassium 3.8, chloride 99, CO2 28, glucose 266, BUN 13, creatinine 1.1, calcium 9.4.  On December 17, 2000, total  protein 6.1, albumin 3.3, AST 30, ALT 46, alkaline phosphatase 76, total bilirubin 0.8.  DISCHARGE MEDICATIONS: 1. Aspirin 325 mg q.d. 2. Lopid 600 mg b.i.d. 3. Toprol XL 50 mg q.d. 4. Plavix 75 mg q.d. for one month. 5. Altace 2.5 mg q.d. 6. Glucotrol XL 10 mg q.d. 7. Glucophage 500 mg b.i.d., to be restarted on Friday, Dec 25, 2000. 8. Nitroglycerin 0.4 mg sublingual p.r.n. chest pain. 9. The patient has been advised to not take Viagra any more.  ACTIVITY:  No driving for one week.  No heavy lifting or exertional work until seen by Dr. Jens Som on Jan 05, 2001.  DIET:  Low fat, low sodium.  WOUND CARE:  The patient is to call the office for any concerns or any swelling, bleeding, or bruising.  DISCHARGE INSTRUCTIONS:  Again, he was reminded to not take his Glucophage until Friday, Dec 25, 2000.  He was also told not to take Viagra.  FOLLOW-UP:  With Dr. Jens Som Jan 05, 2001, at 9:15 a.m. at our office in [place].  He should see Dr. Arlyce Dice as needed. DD:  12/23/00 TD:  12/23/00 Job: [id number]

## 2011-01-30 ENCOUNTER — Other Ambulatory Visit: Payer: Self-pay | Admitting: Cardiology

## 2011-01-30 DIAGNOSIS — I6529 Occlusion and stenosis of unspecified carotid artery: Secondary | ICD-10-CM

## 2011-01-31 ENCOUNTER — Other Ambulatory Visit: Payer: Self-pay | Admitting: *Deleted

## 2011-01-31 ENCOUNTER — Encounter (INDEPENDENT_AMBULATORY_CARE_PROVIDER_SITE_OTHER): Payer: Medicare Other | Admitting: *Deleted

## 2011-01-31 DIAGNOSIS — I6529 Occlusion and stenosis of unspecified carotid artery: Secondary | ICD-10-CM

## 2011-02-04 ENCOUNTER — Encounter: Payer: Self-pay | Admitting: Cardiology

## 2011-02-05 ENCOUNTER — Telehealth: Payer: Self-pay | Admitting: Cardiology

## 2011-02-05 NOTE — Telephone Encounter (Signed)
Spoke with pt wife, aware of carotid results Deliah Goody

## 2011-02-05 NOTE — Telephone Encounter (Signed)
Pt returning nurse call

## 2011-05-06 ENCOUNTER — Other Ambulatory Visit: Payer: Self-pay | Admitting: Cardiology

## 2011-05-20 LAB — CBC
HCT: 32.3 — ABNORMAL LOW
HCT: 33.5 — ABNORMAL LOW
HCT: 37.3 — ABNORMAL LOW
Hemoglobin: 11.1 — ABNORMAL LOW
Hemoglobin: 12.8 — ABNORMAL LOW
MCHC: 34.4
MCHC: 34.6
MCHC: 34.6
Platelets: 199
RBC: 3.76 — ABNORMAL LOW
RDW: 13.6

## 2011-05-20 LAB — POCT I-STAT, CHEM 8
Calcium, Ion: 1.23
Chloride: 104
Creatinine, Ser: 2.1 — ABNORMAL HIGH
Glucose, Bld: 325 — ABNORMAL HIGH
HCT: 38 — ABNORMAL LOW
Potassium: 4.7
Sodium: 137

## 2011-05-20 LAB — BASIC METABOLIC PANEL
BUN: 19
CO2: 25
CO2: 26
CO2: 28
Calcium: 9.3
Calcium: 9.7
Chloride: 101
Chloride: 104
Creatinine, Ser: 1.64 — ABNORMAL HIGH
GFR calc Af Amer: 51 — ABNORMAL LOW
GFR calc non Af Amer: 35 — ABNORMAL LOW
GFR calc non Af Amer: 42 — ABNORMAL LOW
Glucose, Bld: 219 — ABNORMAL HIGH
Glucose, Bld: 326 — ABNORMAL HIGH
Potassium: 3.9
Potassium: 4.2
Sodium: 135

## 2011-05-20 LAB — POCT CARDIAC MARKERS
CKMB, poc: 1.7
Myoglobin, poc: 195
Operator id: 234501
Troponin i, poc: <0.05

## 2011-05-20 LAB — DIFFERENTIAL
Basophils Relative: 0
Eosinophils Relative: 2
Lymphs Abs: 3.4

## 2011-05-20 LAB — PROTIME-INR
INR: 1
Prothrombin Time: 12.9

## 2011-05-20 LAB — APTT: aPTT: 91 — ABNORMAL HIGH

## 2011-05-20 LAB — CARDIAC PANEL(CRET KIN+CKTOT+MB+TROPI)
Total CK: 181
Troponin I: 0.78

## 2011-07-22 ENCOUNTER — Encounter: Payer: Self-pay | Admitting: *Deleted

## 2011-07-23 ENCOUNTER — Encounter: Payer: Self-pay | Admitting: Cardiology

## 2011-07-23 ENCOUNTER — Ambulatory Visit (INDEPENDENT_AMBULATORY_CARE_PROVIDER_SITE_OTHER): Payer: Medicare Other | Admitting: Cardiology

## 2011-07-23 VITALS — BP 145/73 | HR 73 | Ht 72.0 in | Wt 204.0 lb

## 2011-07-23 DIAGNOSIS — I10 Essential (primary) hypertension: Secondary | ICD-10-CM

## 2011-07-23 DIAGNOSIS — E785 Hyperlipidemia, unspecified: Secondary | ICD-10-CM

## 2011-07-23 DIAGNOSIS — I498 Other specified cardiac arrhythmias: Secondary | ICD-10-CM

## 2011-07-23 DIAGNOSIS — I2510 Atherosclerotic heart disease of native coronary artery without angina pectoris: Secondary | ICD-10-CM

## 2011-07-23 DIAGNOSIS — I679 Cerebrovascular disease, unspecified: Secondary | ICD-10-CM

## 2011-07-23 NOTE — Patient Instructions (Signed)
Your physician wants you to follow-up in: ONE YEAR You will receive a reminder letter in the mail two months in advance. If you don't receive a letter, please call our office to schedule the follow-up appointment.   STOP PLAVIX  NIACIN 500 MG TWICE DAILY TO REPLACE NIASPAN

## 2011-07-23 NOTE — Assessment & Plan Note (Signed)
Continue present blood pressure medications. 

## 2011-07-23 NOTE — Assessment & Plan Note (Signed)
Continue aspirin and statin. Followup carotid Dopplers June 2013. 

## 2011-07-23 NOTE — Progress Notes (Signed)
HPI: Pleasant male  who has a history of coronary artery  disease, SVT, hypertension, and hyperlipidemia. Last cardiac catheterization in April of 2009; at that time he was found to have a 40% mid LAD, 70% ostial OM and a 90% stenosis in the mid right coronary artery followed by a 70% stenosis in the distal right coronary artery. Patient had drug-eluting stents to both lesions in the right coronary artery successfully. Last echocardiogram in May of 2010 and showed normal LV function. Carotid Dopplers in June of 2012 showed 40-59% bilateral stenosis. Followup was recommended in one year. Since I last saw him in May of 2011 he has dyspnea with more extreme activities but not with routine activities. Relieved promptly with rest. It is not associated with chest pain. The patient is not having exertional chest pain. There is no orthopnea, PND; mild pedal edema. He has not had syncope.  Current Outpatient Prescriptions  Medication Sig Dispense Refill  . amLODipine (NORVASC) 5 MG tablet Take 5 mg by mouth daily.        Marland Kitchen aspirin 81 MG tablet Take 81 mg by mouth daily.        . clopidogrel (PLAVIX) 75 MG tablet Take 75 mg by mouth daily.        Marland Kitchen glipiZIDE (GLUCOTROL) 5 MG tablet Take 5 mg by mouth daily.        . insulin glargine (LANTUS) 100 UNIT/ML injection Inject into the skin as directed.        . metoprolol (TOPROL-XL) 50 MG 24 hr tablet Take 50 mg by mouth daily.        . niacin (NIASPAN) 1000 MG CR tablet Take 1,000 mg by mouth at bedtime.        . pravastatin (PRAVACHOL) 80 MG tablet TAKE 1 TABLET DAILY  90 tablet  1     Past Medical History  Diagnosis Date  . SUPRAVENTRICULAR TACHYCARDIA   . MYOCARDIAL INFARCTION   . HYPERTENSION   . HYPERLIPIDEMIA   . CEREBROVASCULAR DISEASE   . CEREBROVASCULAR ACCIDENT   . CAD   . RENAL INSUFFICIENCY   . DIABETES MELLITUS     Past Surgical History  Procedure Date  . Coronary stent placement     History   Social History  . Marital Status:  Married    Spouse Name: N/A    Number of Children: N/A  . Years of Education: N/A   Occupational History  . Not on file.   Social History Main Topics  . Smoking status: Former Games developer  . Smokeless tobacco: Not on file  . Alcohol Use: Not on file  . Drug Use: Not on file  . Sexually Active: Not on file   Other Topics Concern  . Not on file   Social History Narrative  . No narrative on file    ROS: no fevers or chills, productive cough, hemoptysis, dysphasia, odynophagia, melena, hematochezia, dysuria, hematuria, rash, seizure activity, orthopnea, PND, claudication. Remaining systems are negative.  Physical Exam: Well-developed well-nourished in no acute distress.  Skin is warm and dry.  HEENT is normal.  Neck is supple. No thyromegaly.  Chest is clear to auscultation with normal expansion.  Cardiovascular exam is regular rate and rhythm. 2/6 systolic murmur left sternal border Abdominal exam nontender or distended. No masses palpated. Extremities show trace edema. neuro grossly intact  ECG normal sinus rhythm at a rate of 73. No ST changes.

## 2011-07-23 NOTE — Assessment & Plan Note (Signed)
Continue beta blocker. 

## 2011-07-23 NOTE — Assessment & Plan Note (Signed)
Continue aspirin and statin. Discontinue Plavix. 

## 2011-07-23 NOTE — Assessment & Plan Note (Signed)
Continue statin. Lipids and liver monitored by primary care. 

## 2012-02-24 ENCOUNTER — Other Ambulatory Visit: Payer: Self-pay | Admitting: *Deleted

## 2012-02-24 DIAGNOSIS — I6529 Occlusion and stenosis of unspecified carotid artery: Secondary | ICD-10-CM

## 2012-03-05 ENCOUNTER — Encounter (INDEPENDENT_AMBULATORY_CARE_PROVIDER_SITE_OTHER): Payer: Medicare Other

## 2012-03-05 DIAGNOSIS — I6529 Occlusion and stenosis of unspecified carotid artery: Secondary | ICD-10-CM

## 2012-03-23 ENCOUNTER — Telehealth: Payer: Self-pay | Admitting: Cardiology

## 2012-03-23 NOTE — Telephone Encounter (Signed)
New Problem:    Patient returned your call.  Please call back and feel free to leave a message.

## 2012-03-23 NOTE — Telephone Encounter (Signed)
Spoke with pt wife, aware of carotid results 

## 2013-03-10 ENCOUNTER — Encounter (INDEPENDENT_AMBULATORY_CARE_PROVIDER_SITE_OTHER): Payer: Medicare Other

## 2013-03-10 DIAGNOSIS — I6529 Occlusion and stenosis of unspecified carotid artery: Secondary | ICD-10-CM

## 2014-10-25 ENCOUNTER — Emergency Department (HOSPITAL_COMMUNITY): Payer: Medicare Other

## 2014-10-25 ENCOUNTER — Inpatient Hospital Stay (HOSPITAL_COMMUNITY)
Admission: EM | Admit: 2014-10-25 | Discharge: 2014-10-31 | DRG: 066 | Disposition: A | Discharge status: Skilled Nursing Facility | Payer: Medicare Other | Attending: Internal Medicine | Admitting: Internal Medicine

## 2014-10-25 ENCOUNTER — Encounter (HOSPITAL_COMMUNITY): Payer: Self-pay | Admitting: Radiology

## 2014-10-25 DIAGNOSIS — Z7982 Long term (current) use of aspirin: Secondary | ICD-10-CM | POA: Diagnosis not present

## 2014-10-25 DIAGNOSIS — I639 Cerebral infarction, unspecified: Secondary | ICD-10-CM | POA: Diagnosis present

## 2014-10-25 DIAGNOSIS — I252 Old myocardial infarction: Secondary | ICD-10-CM | POA: Diagnosis not present

## 2014-10-25 DIAGNOSIS — N259 Disorder resulting from impaired renal tubular function, unspecified: Secondary | ICD-10-CM | POA: Diagnosis present

## 2014-10-25 DIAGNOSIS — I129 Hypertensive chronic kidney disease with stage 1 through stage 4 chronic kidney disease, or unspecified chronic kidney disease: Secondary | ICD-10-CM | POA: Diagnosis present

## 2014-10-25 DIAGNOSIS — N183 Chronic kidney disease, stage 3 (moderate): Secondary | ICD-10-CM | POA: Diagnosis present

## 2014-10-25 DIAGNOSIS — Z794 Long term (current) use of insulin: Secondary | ICD-10-CM | POA: Diagnosis not present

## 2014-10-25 DIAGNOSIS — I10 Essential (primary) hypertension: Secondary | ICD-10-CM | POA: Insufficient documentation

## 2014-10-25 DIAGNOSIS — E781 Pure hyperglyceridemia: Secondary | ICD-10-CM | POA: Diagnosis present

## 2014-10-25 DIAGNOSIS — Z955 Presence of coronary angioplasty implant and graft: Secondary | ICD-10-CM

## 2014-10-25 DIAGNOSIS — I6522 Occlusion and stenosis of left carotid artery: Secondary | ICD-10-CM | POA: Diagnosis present

## 2014-10-25 DIAGNOSIS — I63412 Cerebral infarction due to embolism of left middle cerebral artery: Principal | ICD-10-CM | POA: Diagnosis present

## 2014-10-25 DIAGNOSIS — N189 Chronic kidney disease, unspecified: Secondary | ICD-10-CM

## 2014-10-25 DIAGNOSIS — Z79899 Other long term (current) drug therapy: Secondary | ICD-10-CM | POA: Diagnosis not present

## 2014-10-25 DIAGNOSIS — F17210 Nicotine dependence, cigarettes, uncomplicated: Secondary | ICD-10-CM | POA: Diagnosis present

## 2014-10-25 DIAGNOSIS — I2510 Atherosclerotic heart disease of native coronary artery without angina pectoris: Secondary | ICD-10-CM | POA: Diagnosis present

## 2014-10-25 DIAGNOSIS — E119 Type 2 diabetes mellitus without complications: Secondary | ICD-10-CM

## 2014-10-25 DIAGNOSIS — E1165 Type 2 diabetes mellitus with hyperglycemia: Secondary | ICD-10-CM | POA: Diagnosis present

## 2014-10-25 DIAGNOSIS — G8191 Hemiplegia, unspecified affecting right dominant side: Secondary | ICD-10-CM

## 2014-10-25 DIAGNOSIS — R4701 Aphasia: Secondary | ICD-10-CM | POA: Diagnosis present

## 2014-10-25 DIAGNOSIS — N289 Disorder of kidney and ureter, unspecified: Secondary | ICD-10-CM

## 2014-10-25 DIAGNOSIS — E1142 Type 2 diabetes mellitus with diabetic polyneuropathy: Secondary | ICD-10-CM | POA: Diagnosis present

## 2014-10-25 DIAGNOSIS — I6350 Cerebral infarction due to unspecified occlusion or stenosis of unspecified cerebral artery: Secondary | ICD-10-CM | POA: Diagnosis present

## 2014-10-25 DIAGNOSIS — E785 Hyperlipidemia, unspecified: Secondary | ICD-10-CM | POA: Insufficient documentation

## 2014-10-25 LAB — URINE MICROSCOPIC-ADD ON

## 2014-10-25 LAB — COMPREHENSIVE METABOLIC PANEL
ALT: 19 U/L (ref 0–53)
AST: 19 U/L (ref 0–37)
Albumin: 2.9 g/dL — ABNORMAL LOW (ref 3.5–5.2)
Alkaline Phosphatase: 46 U/L (ref 39–117)
Anion gap: 4 — ABNORMAL LOW (ref 5–15)
BUN: 41 mg/dL — ABNORMAL HIGH (ref 6–23)
CALCIUM: 8.7 mg/dL (ref 8.4–10.5)
CO2: 27 mmol/L (ref 19–32)
Chloride: 107 mmol/L (ref 96–112)
Creatinine, Ser: 3.03 mg/dL — ABNORMAL HIGH (ref 0.50–1.35)
GFR calc Af Amer: 22 mL/min — ABNORMAL LOW (ref >90)
GFR calc non Af Amer: 19 mL/min — ABNORMAL LOW (ref >90)
Glucose, Bld: 302 mg/dL — ABNORMAL HIGH (ref 70–99)
Potassium: 4.3 mmol/L (ref 3.5–5.1)
SODIUM: 138 mmol/L (ref 135–145)
TOTAL PROTEIN: 6 g/dL (ref 6.0–8.3)
Total Bilirubin: 0.3 mg/dL (ref 0.3–1.2)

## 2014-10-25 LAB — URINALYSIS, ROUTINE W REFLEX MICROSCOPIC
Bilirubin Urine: NEGATIVE
HGB URINE DIPSTICK: NEGATIVE
Ketones, ur: NEGATIVE mg/dL
LEUKOCYTES UA: NEGATIVE
Nitrite: NEGATIVE
PH: 6.5 (ref 5.0–8.0)
PROTEIN: NEGATIVE mg/dL
Specific Gravity, Urine: 1.02 (ref 1.005–1.030)
Urobilinogen, UA: 0.2 mg/dL (ref 0.0–1.0)

## 2014-10-25 LAB — DIFFERENTIAL
BASOS ABS: 0 10*3/uL (ref 0.0–0.1)
BASOS PCT: 0 % (ref 0–1)
EOS ABS: 0.2 10*3/uL (ref 0.0–0.7)
EOS PCT: 2 % (ref 0–5)
Lymphocytes Relative: 38 % (ref 12–46)
Lymphs Abs: 3.8 10*3/uL (ref 0.7–4.0)
MONOS PCT: 8 % (ref 3–12)
Monocytes Absolute: 0.8 10*3/uL (ref 0.1–1.0)
Neutro Abs: 5.2 10*3/uL (ref 1.7–7.7)
Neutrophils Relative %: 52 % (ref 43–77)

## 2014-10-25 LAB — ETHANOL

## 2014-10-25 LAB — RAPID URINE DRUG SCREEN, HOSP PERFORMED
Amphetamines: NOT DETECTED
BARBITURATES: NOT DETECTED
BENZODIAZEPINES: NOT DETECTED
Cocaine: NOT DETECTED
Opiates: NOT DETECTED
TETRAHYDROCANNABINOL: NOT DETECTED

## 2014-10-25 LAB — I-STAT TROPONIN, ED: Troponin i, poc: 0.01 ng/mL (ref 0.00–0.08)

## 2014-10-25 LAB — CBC
HCT: 36.1 % — ABNORMAL LOW (ref 39.0–52.0)
Hemoglobin: 11.8 g/dL — ABNORMAL LOW (ref 13.0–17.0)
MCH: 28.8 pg (ref 26.0–34.0)
MCHC: 32.7 g/dL (ref 30.0–36.0)
MCV: 88 fL (ref 78.0–100.0)
PLATELETS: 263 10*3/uL (ref 150–400)
RBC: 4.1 MIL/uL — ABNORMAL LOW (ref 4.22–5.81)
RDW: 14 % (ref 11.5–15.5)
WBC: 10 10*3/uL (ref 4.0–10.5)

## 2014-10-25 LAB — I-STAT CHEM 8, ED
BUN: 43 mg/dL — ABNORMAL HIGH (ref 6–23)
CALCIUM ION: 1.19 mmol/L (ref 1.13–1.30)
CREATININE: 3 mg/dL — AB (ref 0.50–1.35)
Chloride: 106 mmol/L (ref 96–112)
GLUCOSE: 316 mg/dL — AB (ref 70–99)
HEMATOCRIT: 38 % — AB (ref 39.0–52.0)
HEMOGLOBIN: 12.9 g/dL — AB (ref 13.0–17.0)
POTASSIUM: 4.6 mmol/L (ref 3.5–5.1)
SODIUM: 140 mmol/L (ref 135–145)
TCO2: 20 mmol/L (ref 0–100)

## 2014-10-25 LAB — APTT: APTT: 29 s (ref 24–37)

## 2014-10-25 LAB — PROTIME-INR
INR: 1.12 (ref 0.00–1.49)
PROTHROMBIN TIME: 14.5 s (ref 11.6–15.2)

## 2014-10-25 MED ORDER — IOHEXOL 350 MG/ML SOLN
50.0000 mL | Freq: Once | INTRAVENOUS | Status: AC | PRN
  Administered 2014-10-25: 60 mL via INTRAVENOUS

## 2014-10-25 MED ORDER — SODIUM CHLORIDE 0.9 % IV BOLUS (SEPSIS)
1000.0000 mL | Freq: Once | INTRAVENOUS | Status: AC
  Administered 2014-10-25: 1000 mL via INTRAVENOUS

## 2014-10-25 NOTE — ED Notes (Signed)
Per EMS, pt's wife called out because the pt was having a right sided deficit and slurred speech. Last seen normal was 1645. Pt with hx of previous stroke, with deficits to the right side, but the weakness tonight was worse. Pt also noted to be incontinent and drooling. Right sided facial droop, slurred speech and right sided arm droop noted to pt's arrival to the ER. CBG: 398.

## 2014-10-25 NOTE — ED Notes (Signed)
Admitting at bedside 

## 2014-10-25 NOTE — Consult Note (Addendum)
Stroke Consult    Chief Complaint: right sided weakness and slurred speech  HPI: [name] [name] is an 74 y.o. male hx of HTN, prior CVA presenting with acute onset right sided weakness and slurred speech. Per family, he was LSW at 42, shortly after his wife noted marked RUE weakness aphasia and slurred speech. At baseline he has right sided weakness from prior CVA but this is worse. BP noted to be 180/86 Glucose 398.   At initial evaluation noted to have symptoms of right sided weakness, aphasia and slurred speech. Based on presentation, concern for possible left MCA infarct, considered for potential IR intervention. CT imaging reviewed shows no signs of acute stroke or vessel occlusion. Expressive aphasia improved but weakness and dysarthria persist.  Date last known well: 10/25/2014 Time last known well: 1645 tPA Given: no, outside IV tPA window  Past Medical History  Diagnosis Date  . SUPRAVENTRICULAR TACHYCARDIA   . MYOCARDIAL INFARCTION   . HYPERTENSION   . HYPERLIPIDEMIA   . CEREBROVASCULAR DISEASE   . CEREBROVASCULAR ACCIDENT   . CAD   . RENAL INSUFFICIENCY   . DIABETES MELLITUS     Past Surgical History  Procedure Laterality Date  . Coronary stent placement      No family history on file. Social History:  reports that he has quit smoking. He does not have any smokeless tobacco history on file. His alcohol and drug histories are not on file.  Allergies: No Known Allergies   (Not in a hospital admission)  ROS: Out of a complete 14 system review, the patient complains of only the following symptoms, and all other reviewed systems are negative. +weakness   Physical Examination: There were no vitals filed for this visit. Physical Exam  Constitutional: He appears well-developed and well-nourished.  Psych: Affect appropriate to situation Eyes: No scleral injection HENT: No OP obstrucion Head: Normocephalic.  Cardiovascular: Normal rate and regular rhythm.   Respiratory: Effort normal and breath sounds normal.  GI: Soft. Bowel sounds are normal. No distension. There is no tenderness.  Skin: WDI   Neurologic Examination: Mental Status: Alert, oriented, thought content appropriate.  Speech fluent without evidence of aphasia. Severe dysarthria. Able to follow 3 step commands without difficulty. Cranial Nerves: II: funduscopic exam wnl bilaterally, visual fields grossly normal, pupils equal, round, reactive to light and accommodation III,IV, VI: ptosis not present, extra-ocular motions intact bilaterally V,VII: right facial weakness, facial light touch sensation normal bilaterally VIII: hearing normal bilaterally IX,X: gag reflex present XI: trapezius strength/neck flexion strength normal bilaterally XII: tongue strength normal  Motor: Right : Upper extremity    Left:     Upper extremity 2/5 deltoid       5/5 deltoid 3/5 biceps      5/5 biceps  3/5 triceps      5/5 triceps 3/5 hand grip      5/5 hand grip  Lower extremity     Lower extremity 4+5 hip flexor      5/5 hip flexor 4+/5 quadricep     5/5 quadriceps  5-/5 hamstrings     5/5 hamstrings 5/5 plantar flexion       5/5 plantar flexion 5/5 plantar extension     5/5 plantar extension Tone and bulk:normal tone throughout; no atrophy noted Sensory: decreased LT and PP in RUE Deep Tendon Reflexes: 2+ and symmetric throughout Plantars: Right: downgoing   Left: downgoing Cerebellar: Unable to test FTN on RUE, impaired HTS RLE likely weakness related Gait: deferred  Laboratory  Studies:   Basic Metabolic Panel:  Recent Labs Lab 10/25/14 2210  NA 140  K 4.6  CL 106  GLUCOSE 316*  BUN 43*  CREATININE 3.00*    Liver Function Tests: No results for input(s): AST, ALT, ALKPHOS, BILITOT, PROT, ALBUMIN in the last 168 hours. No results for input(s): LIPASE, AMYLASE in the last 168 hours. No results for input(s): AMMONIA in the last 168 hours.  CBC:  Recent Labs Lab  10/25/14 2210  HGB 12.9*  HCT 38.0*    Cardiac Enzymes: No results for input(s): CKTOTAL, CKMB, CKMBINDEX, TROPONINI in the last 168 hours.  BNP: Invalid input(s): POCBNP  CBG: No results for input(s): GLUCAP in the last 168 hours.  Microbiology: No results found for this or any previous visit.  Coagulation Studies: No results for input(s): LABPROT, INR in the last 72 hours.  Urinalysis: No results for input(s): COLORURINE, LABSPEC, PHURINE, GLUCOSEU, HGBUR, BILIRUBINUR, KETONESUR, PROTEINUR, UROBILINOGEN, NITRITE, LEUKOCYTESUR in the last 168 hours.  Invalid input(s): APPERANCEUR  Lipid Panel:     Component Value Date/Time   CHOL 138 01/24/2010 0000   TRIG 321.0* 01/24/2010 0000   HDL 36.20* 01/24/2010 0000   CHOLHDL 4 01/24/2010 0000   VLDL 64.2* 01/24/2010 0000   LDLCALC 41 12/27/2008 0000    HgbA1C: No results found for: HGBA1C  Urine Drug Screen:  No results found for: LABOPIA, COCAINSCRNUR, LABBENZ, AMPHETMU, THCU, LABBARB  Alcohol Level: No results for input(s): ETH in the last 168 hours.  Other results:  Imaging: No results found.  Assessment: 74 y.o. male hx of HTN, prior CVA (with residual right sided weakness) presenting with acute worsening of right sided weakness and slurred speech. Outside tPA window so no IV tPA given. Will admit for stroke workup.   Stroke Risk Factors - HTN, prior CVA  Plan: 1. HgbA1c, fasting lipid panel 2. MRI, MRA  of the brain without contrast 3. PT consult, OT consult, Speech consult 4. Echocardiogram 5. Carotid dopplers 6. Prophylactic therapy-increase ASA to 325mg  daily 7. Risk factor modification 8. Telemetry monitoring 9. Frequent neuro checks 10. NPO until RN stroke swallow screen     Jim Like, DO [facility] [phone]  If 7pm- 7am, please page neurology on call as listed in AMION. 10/25/2014, 10:26 PM

## 2014-10-26 ENCOUNTER — Inpatient Hospital Stay (HOSPITAL_COMMUNITY): Payer: Medicare Other

## 2014-10-26 DIAGNOSIS — E119 Type 2 diabetes mellitus without complications: Secondary | ICD-10-CM

## 2014-10-26 DIAGNOSIS — G8191 Hemiplegia, unspecified affecting right dominant side: Secondary | ICD-10-CM

## 2014-10-26 DIAGNOSIS — I10 Essential (primary) hypertension: Secondary | ICD-10-CM | POA: Insufficient documentation

## 2014-10-26 DIAGNOSIS — I25119 Atherosclerotic heart disease of native coronary artery with unspecified angina pectoris: Secondary | ICD-10-CM

## 2014-10-26 DIAGNOSIS — E785 Hyperlipidemia, unspecified: Secondary | ICD-10-CM

## 2014-10-26 DIAGNOSIS — I6789 Other cerebrovascular disease: Secondary | ICD-10-CM

## 2014-10-26 DIAGNOSIS — I639 Cerebral infarction, unspecified: Secondary | ICD-10-CM

## 2014-10-26 DIAGNOSIS — N259 Disorder resulting from impaired renal tubular function, unspecified: Secondary | ICD-10-CM

## 2014-10-26 LAB — GLUCOSE, CAPILLARY
GLUCOSE-CAPILLARY: 120 mg/dL — AB (ref 70–99)
Glucose-Capillary: 101 mg/dL — ABNORMAL HIGH (ref 70–99)
Glucose-Capillary: 159 mg/dL — ABNORMAL HIGH (ref 70–99)
Glucose-Capillary: 185 mg/dL — ABNORMAL HIGH (ref 70–99)
Glucose-Capillary: 194 mg/dL — ABNORMAL HIGH (ref 70–99)

## 2014-10-26 LAB — BASIC METABOLIC PANEL
Anion gap: 9 (ref 5–15)
BUN: 36 mg/dL — ABNORMAL HIGH (ref 6–23)
CO2: 24 mmol/L (ref 19–32)
Calcium: 9 mg/dL (ref 8.4–10.5)
Chloride: 108 mmol/L (ref 96–112)
Creatinine, Ser: 2.74 mg/dL — ABNORMAL HIGH (ref 0.50–1.35)
GFR calc Af Amer: 24 mL/min — ABNORMAL LOW (ref >90)
GFR, EST NON AFRICAN AMERICAN: 21 mL/min — AB (ref >90)
Glucose, Bld: 107 mg/dL — ABNORMAL HIGH (ref 70–99)
POTASSIUM: 3.8 mmol/L (ref 3.5–5.1)
SODIUM: 141 mmol/L (ref 135–145)

## 2014-10-26 LAB — LIPID PANEL
CHOL/HDL RATIO: 4.7 ratio
Cholesterol: 132 mg/dL (ref 0–200)
HDL: 28 mg/dL — AB (ref >39)
LDL CALC: 65 mg/dL (ref 0–99)
Triglycerides: 197 mg/dL — ABNORMAL HIGH (ref <150)
VLDL: 39 mg/dL (ref 0–40)

## 2014-10-26 MED ORDER — SODIUM CHLORIDE 0.9 % IV SOLN
INTRAVENOUS | Status: DC
  Administered 2014-10-27: 500 mL via INTRAVENOUS

## 2014-10-26 MED ORDER — SENNOSIDES-DOCUSATE SODIUM 8.6-50 MG PO TABS: 1.0000 | ORAL_TABLET | Freq: Every evening | ORAL | Status: DC | PRN

## 2014-10-26 MED ORDER — METOPROLOL SUCCINATE ER 25 MG PO TB24
50.0000 mg | ORAL_TABLET | Freq: Every day | ORAL | Status: DC
  Administered 2014-10-26 – 2014-10-31 (×6): 50 mg via ORAL
  Filled 2014-10-26 (×6): qty 2

## 2014-10-26 MED ORDER — ASPIRIN 325 MG PO TABS
325.0000 mg | ORAL_TABLET | Freq: Every day | ORAL | Status: DC
  Administered 2014-10-26 – 2014-10-27 (×2): 325 mg via ORAL
  Filled 2014-10-26 (×2): qty 1

## 2014-10-26 MED ORDER — ENOXAPARIN SODIUM 30 MG/0.3ML ~~LOC~~ SOLN
30.0000 mg | SUBCUTANEOUS | Status: DC
  Administered 2014-10-26 – 2014-10-31 (×6): 30 mg via SUBCUTANEOUS
  Filled 2014-10-26 (×6): qty 0.3

## 2014-10-26 MED ORDER — INSULIN GLARGINE 100 UNIT/ML ~~LOC~~ SOLN
20.0000 [IU] | Freq: Every day | SUBCUTANEOUS | Status: DC
  Administered 2014-10-26 – 2014-10-30 (×5): 20 [IU] via SUBCUTANEOUS
  Filled 2014-10-26 (×6): qty 0.2

## 2014-10-26 MED ORDER — ASPIRIN 300 MG RE SUPP: 300.0000 mg | Freq: Every day | RECTAL | Status: DC

## 2014-10-26 MED ORDER — PRAVASTATIN SODIUM 40 MG PO TABS
80.0000 mg | ORAL_TABLET | Freq: Every day | ORAL | Status: DC
  Administered 2014-10-26 – 2014-10-31 (×6): 80 mg via ORAL
  Filled 2014-10-26 (×6): qty 2

## 2014-10-26 MED ORDER — INSULIN GLARGINE 100 UNIT/ML ~~LOC~~ SOLN
20.0000 [IU] | Freq: Every day | SUBCUTANEOUS | Status: AC
  Filled 2014-10-26: qty 0.2

## 2014-10-26 MED ORDER — INSULIN ASPART 100 UNIT/ML ~~LOC~~ SOLN
0.0000 [IU] | SUBCUTANEOUS | Status: DC
  Administered 2014-10-26 – 2014-10-27 (×4): 3 [IU] via SUBCUTANEOUS
  Administered 2014-10-27: 2 [IU] via SUBCUTANEOUS
  Administered 2014-10-27: 3 [IU] via SUBCUTANEOUS
  Administered 2014-10-27: 2 [IU] via SUBCUTANEOUS
  Administered 2014-10-28: 3 [IU] via SUBCUTANEOUS
  Administered 2014-10-28: 5 [IU] via SUBCUTANEOUS
  Administered 2014-10-28: 3 [IU] via SUBCUTANEOUS
  Administered 2014-10-29 (×2): 2 [IU] via SUBCUTANEOUS
  Administered 2014-10-29: 3 [IU] via SUBCUTANEOUS
  Administered 2014-10-29 (×2): 5 [IU] via SUBCUTANEOUS
  Administered 2014-10-30: 3 [IU] via SUBCUTANEOUS

## 2014-10-26 MED ORDER — SODIUM CHLORIDE 0.9 % IV SOLN
INTRAVENOUS | Status: DC
  Administered 2014-10-26 – 2014-10-27 (×5): via INTRAVENOUS

## 2014-10-26 MED ORDER — STROKE: EARLY STAGES OF RECOVERY BOOK
Freq: Once | Status: AC
  Administered 2014-10-26: 01:00:00

## 2014-10-26 NOTE — Progress Notes (Signed)
STROKE TEAM PROGRESS NOTE   HISTORY [name] [name] is a 74 y.o. male hx of HTN, coronary artery disease, supraventricular tachycardia, previous MI, hypertension, hyperlipidemia, renal insufficiency, diabetes mellitus, and prior CVA presenting with acute onset right sided weakness and slurred speech. Per family, he was LSW at 10, shortly after his wife noted marked RUE weakness aphasia and slurred speech. At baseline he has right sided weakness from prior CVA but this is worse. BP noted to be 180/86 Glucose 398.   At initial evaluation noted to have symptoms of right sided weakness, aphasia and slurred speech. Based on presentation, concern for possible left MCA infarct, considered for potential IR intervention. CT imaging reviewed shows no signs of acute stroke or vessel occlusion. Expressive aphasia improved but weakness and dysarthria persist.  Date last known well: 10/25/2014 Time last known well: 1645 tPA Given: no, outside IV tPA window  SUBJECTIVE (INTERVAL HISTORY) Two family members at the bedside. The patient had previous strokes and 2008 which appeared embolic (right cerebellar, left MCA, left MCA/PCA). He denies any history of palpitations or racing heart rates.   OBJECTIVE Temp:  [97.4 F (36.3 C)-98.4 F (36.9 C)] 98.4 F (36.9 C) (03/03 2115) Pulse Rate:  [60-89] 60 (03/03 2115) Cardiac Rhythm:  [-] Normal sinus rhythm (03/03 0844) Resp:  [14-20] 20 (03/03 2115) BP: (136-186)/(59-99) 141/99 mmHg (03/03 2115) SpO2:  [95 %-100 %] 99 % (03/03 2115) Weight:  [188 lb 7.9 oz (85.5 kg)-188 lb 11.4 oz (85.599 kg)] 188 lb 7.9 oz (85.5 kg) (03/03 0041)   Recent Labs Lab 10/26/14 0409 10/26/14 0845 10/26/14 1205 10/26/14 1617 10/26/14 1959  GLUCAP 120* 101* 159* 185* 194*    Recent Labs Lab 10/25/14 2210 10/25/14 2219 10/26/14 0500  NA 140 138 141  K 4.6 4.3 3.8  CL 106 107 108  CO2  --  27 24  GLUCOSE 316* 302* 107*  BUN 43* 41* 36*  CREATININE 3.00* 3.03* 2.74*   CALCIUM  --  8.7 9.0    Recent Labs Lab 10/25/14 2219  AST 19  ALT 19  ALKPHOS 46  BILITOT 0.3  PROT 6.0  ALBUMIN 2.9*    Recent Labs Lab 10/25/14 2210 10/25/14 2219  WBC  --  10.0  NEUTROABS  --  5.2  HGB 12.9* 11.8*  HCT 38.0* 36.1*  MCV  --  88.0  PLT  --  263   No results for input(s): CKTOTAL, CKMB, CKMBINDEX, TROPONINI in the last 168 hours.  Recent Labs  10/25/14 2219  LABPROT 14.5  INR 1.12    Recent Labs  10/25/14 2241  COLORURINE YELLOW  LABSPEC 1.020  PHURINE 6.5  GLUCOSEU >1000*  HGBUR NEGATIVE  BILIRUBINUR NEGATIVE  KETONESUR NEGATIVE  PROTEINUR NEGATIVE  UROBILINOGEN 0.2  NITRITE NEGATIVE  LEUKOCYTESUR NEGATIVE       Component Value Date/Time   CHOL 132 10/26/2014 0500   TRIG 197* 10/26/2014 0500   HDL 28* 10/26/2014 0500   CHOLHDL 4.7 10/26/2014 0500   VLDL 39 10/26/2014 0500   LDLCALC 65 10/26/2014 0500   No results found for: HGBA1C    Component Value Date/Time   LABOPIA NONE DETECTED 10/25/2014 2241   COCAINSCRNUR NONE DETECTED 10/25/2014 2241   LABBENZ NONE DETECTED 10/25/2014 2241   AMPHETMU NONE DETECTED 10/25/2014 2241   THCU NONE DETECTED 10/25/2014 2241   LABBARB NONE DETECTED 10/25/2014 2241     Recent Labs Lab 10/25/14 2219  ETH <5    Ct Angio Head W/cm &/or Wo  Cm 10/25/2014     CT HEAD:   Mild to moderate motion degraded examination.  No acute intracranial process. LEFT frontoparietal encephalomalacia suggest remote LEFT middle cerebral artery territory infarct.  Moderate to severe parenchymal brain volume loss. Moderate to severe white matter changes suggest chronic small vessel ischemic disease. Chronic appearing RIGHT basal ganglia and RIGHT thalamus lacunar infarcts.    CTA NECK:  Atherosclerosis of the carotid bulbs, with up to 40% stenosis of LEFT internal carotid artery by NASCET criteria.  Approximately 50% stenosis of vertebral artery origin.  Acute maxillary sinusitis.    CTA HEAD:  Complete  circle of Willis without large vessel occlusion or hemodynamically significant stenosis.  Dolichoectatic intracranial vessels suggests sequelae of chronic hypertension.    MRI of the brain without contrast 10/26/2014 Acute LEFT MCA territory posterior frontal cortical and subcortical white matter infarction.  No hemorrhage or mass effect. Progression of generalized atrophy and small vessel disease with sequelae of multiple prior infarcts which were acute in 2008. No large vessel occlusion is evident. Acute and chronic maxillary sinus disease.  Renal ultrasound 10/26/2014 Negative renal ultrasound.  Bilateral lower extremity venous duplex Study was technically difficult due to poor patient cooperation and posterior acoustic shadowing from lower extremity arteries. Visualized veins of bilateral lower extremities which were able to be compressed are negative for deep vein thrombosis. Unable to perform compression maneuvers on some segments due to poor patient cooperation and guarding. There is no evidence of Baker's cyst bilaterally.  Vascular Ultrasound Carotid Duplex (Doppler) has been completed.  Findings suggest 1-39% right internal carotid artery stenosis and upper range 1-39% versus low range 40-59% stenosis of the left internal carotid artery. Vertebral arteries are patent with antegrade flow.  2D echo Limited, poor quality study. Probably normal overall LV function. Study is not adequate for wall motion analysis. No overt valvular abnormalities. Normal right ventricular size and function. No pericardial effusion. Impaired relaxation pattern of left ventricular filling. Normal mean left atrial pressure.  PHYSICAL EXAM  Temp:  [97.4 F (36.3 C)-98.4 F (36.9 C)] 98.4 F (36.9 C) (03/03 2115) Pulse Rate:  [60-89] 60 (03/03 2115) Resp:  [14-20] 20 (03/03 2115) BP: (136-186)/(59-99) 141/99 mmHg (03/03 2115) SpO2:  [95 %-100 %] 99 % (03/03 2115) Weight:  [188  lb 7.9 oz (85.5 kg)-188 lb 11.4 oz (85.599 kg)] 188 lb 7.9 oz (85.5 kg) (03/03 0041)  General - Well nourished, well developed, in no apparent distress.  Ophthalmologic - Sharp disc margins OU.  Cardiovascular - Regular rate and rhythm with no murmur.  Mental Status -  Level of arousal and orientation to time, place, and person were intact. Language including expression, naming, repetition, comprehension was assessed and found intact.  Cranial Nerves II - XII - II - Visual field intact OU. III, IV, VI - Extraocular movements intact. V - Facial sensation intact bilaterally. VII - Facial movement showed mild right facial droop. VIII - Hearing & vestibular intact bilaterally. X - Palate elevates symmetrically. XI - Chin turning & shoulder shrug intact bilaterally. XII - Tongue protrusion intact.  Motor Strength - The patient's strength was 3/5 RUE proximal and 4/5 distal, 5-/5 RLE and right pronator drift was present. 5/5 LUE and LLE.  Bulk was normal and fasciculations were absent.   Motor Tone - Muscle tone was assessed at the neck and appendages and was normal.  Reflexes - The patient's reflexes were normal in all extremities and he had no pathological reflexes.  Sensory - Light touch,  temperature/pinprick were assessed and were normal.    Coordination - The patient had normal movements in the left hand and foot with no ataxia or dysmetria, right UE ataxia proportional to weakness.  Tremor was absent.  Gait and Station - not tested due to safety concerns.  ASSESSMENT/PLAN [name] [name] is a 74 y.o. 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 He did not receive IV t-PA due to late presentation.   Stroke:   Dominant left MCA higher cortical infarct - possibly embolic. Pt also has left MCA, MCA/PCA and right cerebellar infarcts in 2008, consistent with cardio embolic stroke.   Resultant - right hemiparesis  MRI Acute LEFT MCA territory posterior frontal cortical high convexity infarction.   CTA head and neck - 40% ICA stenosis on the left  Carotid Doppler - 1-39% right internal carotid artery stenosis and upper range 1-39% versus low range  40-59% stenosis of the left internal carotid artery. Vertebral arteries are patent with antegrade flow.  Bilateral lower extremity venous duplex - technically difficult study but negative for DVT.  2D Echo - limited study but probably normal EF  TEE pending  LDL - 65  HgbA1c pending  Lovenox  for VTE prophylaxis  Diet Carb Modified with thin liquids  aspirin 81 mg orally every day prior to admission, now on aspirin 325 mg orally every day  Ongoing aggressive stroke risk factor management  Therapy recommendations:  Pending  Disposition:  Pending  Hx of stroke in 4196  Embolic pattern, right cerebellar, left MCA, left MCA/PCA  Residue right UE weakness but able to do most of things  Was put on plavix and then switched to ASA. Currently on ASA 81 before admission  Hypertension  Home meds:   Norvasc and Toprol-XL  Stable  Hyperlipidemia  Home meds:  Pravachol 80 mg daily -   LDL 65, goal < 70  Continue statin at discharge  Diabetes  HgbA1c pending, goal < 7.0  Uncontrolled  Other Stroke Risk Factors  Advanced age  Cigarette smoker, quit smoking   Obesity, Body mass index is 30.44 kg/(m^2).   Hx stroke/TIA  Coronary artery disease   Other Active Problems  Renal insufficiency  TEE and loop recorder Friday a.m. - cardiology notified  Other Pertinent History  History of supraventricular tachycardia  Hospital day # [place] PA-C [facility] Pager [phone] 10/26/2014, 9:40 PM  I,  the attending vascular neurologist, have personally obtained a history, examined the patient, evaluated laboratory data, individually viewed imaging studies and agree with radiology interpretations. I also obtained additional history from pt's wife at bedside. I also discussed with Dr. Allyson Sabal regarding his care plan. Together with the NP/PA, we formulated the assessment and plan of care which reflects our mutual decision.  I have made any additions or clarifications directly to the above note and agree with the findings and plan as currently documented.   74 yo M with hx of stroke in 2008, CAD in 2001 and 2002, HTN was admitted for left MCA cortical stroke, pt also surffered from stroke in 2008 involving right cerebellar and left MCA and left MCA/PCA, embolic pattern. Was on plavix and now on ASA 81 prior to admission. Stroke work up so far non revealing, will do  TEE and looper recorder.  Put on full dose ASA and continue pravastation. Risk factor control.   Rosalin Hawking, MD PhD Stroke Neurology 10/26/2014 9:53 PM   To contact Stroke Continuity provider, please refer to [email or web address] After hours, contact General Neurology

## 2014-10-26 NOTE — Progress Notes (Signed)
    [facility] has been requested to perform a transesophageal echocardiogram on [name] [name] for stroke workup.  After careful review of history and examination, the risks and benefits of transesophageal echocardiogram have been explained including risks of esophageal damage, perforation (1:10,000 risk), bleeding, pharyngeal hematoma as well as other potential complications associated with conscious sedation including aspiration, arrhythmia, respiratory failure and death. Alternatives to treatment were discussed, questions were answered. Patient is willing to proceed.   Tarri Fuller, [facility] 10/26/2014 2:58 PM

## 2014-10-26 NOTE — Evaluation (Signed)
Clinical/Bedside Swallow Evaluation Patient Details  Name: [name] [name] MRN: [id number] Date of Birth: 1941-08-02  Today's Date: 10/26/2014 Time: SLP Start Time (ACUTE ONLY): 77 SLP Stop Time (ACUTE ONLY): 0948 SLP Time Calculation (min) (ACUTE ONLY): 13 min  Past Medical History:  Past Medical History  Diagnosis Date  . SUPRAVENTRICULAR TACHYCARDIA   . MYOCARDIAL INFARCTION   . HYPERTENSION   . HYPERLIPIDEMIA   . CEREBROVASCULAR DISEASE   . CEREBROVASCULAR ACCIDENT   . CAD   . RENAL INSUFFICIENCY   . DIABETES MELLITUS    Past Surgical History:  Past Surgical History  Procedure Laterality Date  . Coronary stent placement     HPI:  Pt is a 74 y/o male with past medical history of prior embolic CVA with residual right-sided weakness,, SVT, CK D3, type 2 diabetes, CAD, hypertension, dyslipidemia presents today for the above complaint. His wife noticed slurring of speech when she called him today from work and right-sided weakness, worsened from his baseline right-sided weakness. His speech has slowly started improving he also had a CT angiogram done per neuro recommendations. CT head with no acute intracranial abnormalities. in addition also noted to have CBG in the 300s and creatinine of 3, last creatinine in our system was 1.8 from 2011, reportedly he has seen a nephrologist with [facility] and is followed by his PCP for CKD. MRI revealed acute LEFT MCA territory posterior frontal cortical and subcortical white matter infarction. No hemorrhage or mass effect. Progression of generalized atrophy and small vessel disease with sequelae of multiple prior infarcts which were acute in 2008.   Assessment / Plan / Recommendation Clinical Impression   Pt was alert and cooperative. PO trails across consistencies did not elicit s/s of aspiration. Recommend pt initiate regular/ thin liquid diet (pt edentulous; no dentures). Speech will follow-up for diet tolerance and SLE.     Aspiration  Risk  Mild    Diet Recommendation Regular;Thin liquid   Liquid Administration via: Cup;Straw Medication Administration: Whole meds with puree Supervision: Staff to assist with self feeding Compensations: Slow rate;Small sips/bites Postural Changes and/or Swallow Maneuvers: Out of bed for meals    Other  Recommendations Oral Care Recommendations: Oral care BID   Follow Up Recommendations       Frequency and Duration min 2x/week  2 weeks   Pertinent Vitals/Pain     SLP Swallow Goals     Swallow Study Prior Functional Status       General HPI: Pt is a 74 y/o male with past medical history of prior embolic CVA with residual right-sided weakness,, SVT, CK D3, type 2 diabetes, CAD, hypertension, dyslipidemia presents today for the above complaint. His wife noticed slurring of speech when she called him today from work and right-sided weakness, worsened from his baseline right-sided weakness. His speech has slowly started improving he also had a CT angiogram done per neuro recommendations. CT head with no acute intracranial abnormalities. in addition also noted to have CBG in the 300s and creatinine of 3, last creatinine in our system was 1.8 from 2011, reportedly he has seen a nephrologist with [facility] and is followed by his PCP for CKD. MRI revealed acute LEFT MCA territory posterior frontal cortical and subcortical white matter infarction. No hemorrhage or mass effect. Progression of generalized atrophy and small vessel disease with sequelae of multiple prior infarcts which were acute in 2008. Type of Study: Bedside swallow evaluation Diet Prior to this Study: Regular;Thin liquids Respiratory Status: Room air Behavior/Cognition:  Alert;Cooperative;Pleasant mood Oral Cavity - Dentition: Edentulous Self-Feeding Abilities: Needs assist (Pt is right handed, has weakness on right side. ) Patient Positioning: Upright in bed Baseline Vocal Quality: Clear;Low vocal intensity     Oral/Motor/Sensory Function Overall Oral Motor/Sensory Function: Appears within functional limits for tasks assessed Labial ROM: Within Functional Limits Labial Symmetry: Within Functional Limits Lingual ROM: Within Functional Limits Lingual Symmetry: Within Functional Limits Facial ROM: Within Functional Limits   Ice Chips Ice chips: Not tested   Thin Liquid Thin Liquid: Within functional limits Presentation: Cup;Straw    Nectar Thick Nectar Thick Liquid: Not tested   Honey Thick Honey Thick Liquid: Not tested   Puree Puree: Within functional limits Presentation: Self Fed;Spoon   Solid   GO Functional Assessment Tool Used: Skilled observation  Solid: Within functional limits Presentation: Self Fed       Bermudez-Bosch, Del Overfelt 10/26/2014,9:55 AM

## 2014-10-26 NOTE — Clinical Social Work Note (Signed)
CSW Consult Acknowledged:   CSW received a consult for SNF placement. CSW awaiting PT/OT evaluation to determine the appropriate level of care.      Jaise Moser, MSW, LCSWA [phone]  

## 2014-10-26 NOTE — Progress Notes (Signed)
  Echocardiogram 2D Echocardiogram has been performed.  [name] [name] 10/26/2014, 2:22 PM

## 2014-10-26 NOTE — H&P (Signed)
[facility] History and Physical  [name] [name] TGG:[id number] DOB: 1941-08-14 DOA: 10/25/2014  Referring physician: EDP PCP: Stephens Shire, MD   Chief Complaint: Right-sided weakness and slurred speech HPI: [name] [name] is a 74 y.o. male  with past medical history of prior embolic CVA with residual right-sided weakness,, SVT, CK D3, type 2 diabetes, CAD, hypertension, dyslipidemia presents today for the above complaint. His wife noticed slurring of speech when she called him today from work and right-sided weakness, worsened from his baseline right-sided weakness. His speech has slowly started improving he also had a CT angiogram done per neuro recommendations. CT head with no acute intracranial abnormalities. in addition also noted to have CBG in the 300s and crhe eatinine of 3, last creatinine in our system was 1.8 from 2011,  reportedly he has seen a nephrologist with [facility] and is followed by his PCP for CKD   Review of Systems:  12 system review completed and negative  Constitutional:  No weight loss, night sweats, Fevers, chills, fatigue.  HEENT:  No headaches, Difficulty swallowing,Tooth/dental problems,Sore throat,  No sneezing, itching, ear ache, nasal congestion, post nasal drip,  Cardio-vascular:  No chest pain, Orthopnea, PND, swelling in lower extremities, anasarca, dizziness, palpitations  GI:  No heartburn, indigestion, abdominal pain, nausea, vomiting, diarrhea, change in bowel habits, loss of appetite  Resp:  No shortness of breath with exertion or at rest. No excess mucus, no productive cough, No non-productive cough, No coughing up of blood.No change in color of mucus.No wheezing.No chest wall deformity  Skin:  no rash or lesions.  GU:  no dysuria, change in color of urine, no urgency or frequency. No flank pain.  Musculoskeletal:  No joint pain or swelling. No decreased range of motion. No back pain.  Psych:  No change in mood or affect. No  depression or anxiety. No memory loss.   Past Medical History  Diagnosis Date  . SUPRAVENTRICULAR TACHYCARDIA   . MYOCARDIAL INFARCTION   . HYPERTENSION   . HYPERLIPIDEMIA   . CEREBROVASCULAR DISEASE   . CEREBROVASCULAR ACCIDENT   . CAD   . RENAL INSUFFICIENCY   . DIABETES MELLITUS    Past Surgical History  Procedure Laterality Date  . Coronary stent placement     Social History:  reports that he has quit smoking. He does not have any smokeless tobacco history on file. He reports that he does not drink alcohol. His drug history is not on file.  No Known Allergies  family history -Follow deceased secondary to heart disease   Prior to Admission medications   Medication Sig Start Date End Date Taking? Authorizing Provider  amLODipine (NORVASC) 5 MG tablet Take 5 mg by mouth daily.      Historical Provider, MD  aspirin 81 MG tablet Take 81 mg by mouth daily.      Historical Provider, MD  glipiZIDE (GLUCOTROL) 5 MG tablet Take 5 mg by mouth daily.      Historical Provider, MD  insulin glargine (LANTUS) 100 UNIT/ML injection Inject into the skin as directed.      Historical Provider, MD  metoprolol (TOPROL-XL) 50 MG 24 hr tablet Take 50 mg by mouth daily.      Historical Provider, MD  niacin (NIASPAN) 1000 MG CR tablet Take 1,000 mg by mouth at bedtime.      Historical Provider, MD  pravastatin (PRAVACHOL) 80 MG tablet TAKE 1 TABLET DAILY 05/06/11   [name], MD   Physical Exam: Filed Vitals:  10/25/14 2245 10/25/14 2300 10/25/14 2345 10/26/14 0002  BP: 173/68 166/72 155/78   Pulse: 83 82 88   Temp:    98.4 F (36.9 C)  TempSrc:      Resp: 19 19 15    Height:      Weight:      SpO2: 97% 96% 98%     Wt Readings from Last 3 Encounters:  10/25/14 85.599 kg (188 lb 11.4 oz)  07/23/11 92.534 kg (204 lb)  12/12/08 90.719 kg (200 lb)    General:  Appears calm and comfortable,  emotionally labile  Eyes: PERRL, normal lids, irises & conjunctiva ENT: grossly normal  hearing, lips & tongue Neck: no LAD, masses or thyromegaly Cardiovascular: RRR, no m/r/g. No LE edema. Telemetry: SR, no arrhythmias  Respiratory: CTA bilaterally, no w/r/r. Normal respiratory effort. Abdomen: soft, ntnd Skin: no rash or induration seen on limited exam Musculoskeletal: grossly normal tone BUE/BLE Psychiatric: grossly normal mood and affect, speech fluent and appropriate Neurologic:  Dysarthria noted, right upper extremity and right lower extremity strength is 3+/5, rest 5 out of 5 sensory light touch intact plantars withdrawal, DTR 2+           Labs on Admission:  Basic Metabolic Panel:  Recent Labs Lab 10/25/14 2210 10/25/14 2219  NA 140 138  K 4.6 4.3  CL 106 107  CO2  --  27  GLUCOSE 316* 302*  BUN 43* 41*  CREATININE 3.00* 3.03*  CALCIUM  --  8.7   Liver Function Tests:  Recent Labs Lab 10/25/14 2219  AST 19  ALT 19  ALKPHOS 46  BILITOT 0.3  PROT 6.0  ALBUMIN 2.9*   No results for input(s): LIPASE, AMYLASE in the last 168 hours. No results for input(s): AMMONIA in the last 168 hours. CBC:  Recent Labs Lab 10/25/14 2210 10/25/14 2219  WBC  --  10.0  NEUTROABS  --  5.2  HGB 12.9* 11.8*  HCT 38.0* 36.1*  MCV  --  88.0  PLT  --  263   Cardiac Enzymes: No results for input(s): CKTOTAL, CKMB, CKMBINDEX, TROPONINI in the last 168 hours.  BNP (last 3 results) No results for input(s): BNP in the last 8760 hours.  ProBNP (last 3 results) No results for input(s): PROBNP in the last 8760 hours.  CBG: No results for input(s): GLUCAP in the last 168 hours.  Radiological Exams on Admission: Ct Angio Head W/cm &/or Wo Cm  10/25/2014   CLINICAL DATA:  RIGHT arm weakness, slurred speech, last seen normal at 1645 hours. History of hypertension, hyperlipidemia, diabetes, stroke.  EXAM: CT ANGIOGRAPHY HEAD AND NECK  TECHNIQUE: Multidetector CT imaging of the head and neck was performed using the standard protocol during bolus administration of  intravenous contrast. Multiplanar CT image reconstructions and MIPs were obtained to evaluate the vascular anatomy. Carotid stenosis measurements (when applicable) are obtained utilizing NASCET criteria, using the distal internal carotid diameter as the denominator.  CONTRAST:  67mL OMNIPAQUE IOHEXOL 350 MG/ML SOLN  COMPARISON:  MRI of the brain November 19, 2006  FINDINGS: CT HEAD  Brain: Mild to moderately motion degraded examination, particularly degrades evaluation skull base.  No intraparenchymal hemorrhage, mass effect, midline shift or acute large vascular territory infarct. LEFT frontoparietal encephalomalacia. Patchy to confluent supratentorial white matter hypodensities. Moderate to severe ventriculomegaly, likely on the basis of global parenchymal brain volume loss as there is overall commensurate enlargement of cerebral sulci and cerebellar folia. Remote RIGHT cystic basal ganglia and thalamus  lacunar infarcts.  Basal cisterns are patent. Moderate calcific atherosclerosis of the carotid siphons.  Calvarium and skull base: No skull fracture though, assessment limited at the skull base. Patient is edentulous.  Paranasal sinuses: Probable maxillary sinusitis, limited by motion. Limited assessment of mastoid air cells.  Orbits:   Status post bilateral ocular lens implants.  CTA NECK  Normal appearance of the thoracic arch, normal branch pattern. Mild to moderate calcific atherosclerosis. The origins of the innominate, left Common carotid artery and subclavian artery are widely patent.  Bilateral Common carotid arteries are widely patent, coursing in a straight line fashion. Eccentric intimal thickening results in less than 40% stenosis of LEFT internal carotid artery. Mild calcific atherosclerosis of the RIGHT Common carotid artery. 2-3 mm eccentric intimal thickening and calcific atherosclerosis of the carotid bulbs extending to the internal carotid artery origins. Normal appearance of the carotid bifurcations  without hemodynamically significant stenosis by NASCET criteria. Approximately 40% stenosis of LEFT internal carotid artery origin. Normal appearance of the included internal carotid arteries.  Left vertebral artery is dominant. Atherosclerosis of the origin results in approximately 50% narrowing of RIGHT vertebral artery origin. The vertebral arteries  No dissection, no pseudoaneurysm. No abnormal luminal irregularity. No contrast extravasation.  9 mm LEFT thyroid nodule, below size surveillance recommendations. Fatty parotid glands. Patient is edentulous. Bilateral maxillary sinus mucosal thickening with air-fluid levels. No acute osseous process though bone windows have not been submitted.  CTA HEAD  Anterior circulation: Normal appearance of the cervical internal carotid arteries, petrous, cavernous and supra clinoid internal carotid arteries. Widely patent anterior communicating artery. Normal appearance of the anterior and middle cerebral arteries. Dolichoectatic intracranial vessels.  Posterior circulation: The LEFT vertebral artery is dominant with normal appearance of the vertebral arteries, vertebrobasilar junction and basilar artery, as well as main branch vessels. RIGHT posterior inferior cerebellar artery origin infundibulum. Large, robust bilateral posterior communicating arteries contribute the predominant vascular supply to the posterior cerebral arteries which are widely patent. Dolichoectatic intracranial vessels.  No large vessel occlusion, hemodynamically significant stenosis, dissection, luminal irregularity, contrast extravasation or aneurysm within the anterior nor posterior circulation.  IMPRESSION: CT HEAD:  Mild to moderate motion degraded examination.  No acute intracranial process. LEFT frontoparietal encephalomalacia suggest remote LEFT middle cerebral artery territory infarct.  Moderate to severe parenchymal brain volume loss. Moderate to severe white matter changes suggest chronic  small vessel ischemic disease. Chronic appearing RIGHT basal ganglia and RIGHT thalamus lacunar infarcts.  CTA NECK: Atherosclerosis of the carotid bulbs, with up to 40% stenosis of LEFT internal carotid artery by NASCET criteria.  Approximately 50% stenosis of vertebral artery origin.  Acute maxillary sinusitis.  CTA HEAD: Complete circle of Willis without large vessel occlusion or hemodynamically significant stenosis.  Dolichoectatic intracranial vessels suggests sequelae of chronic hypertension.  Acute findings discussed with and reconfirmed by Dr.PETER SUMNER on 10/25/2014 at 10:59 pm.   Electronically Signed   By: Elon Alas   On: 10/25/2014 23:00   Ct Angio Neck W/cm &/or Wo/cm  10/25/2014   CLINICAL DATA:  RIGHT arm weakness, slurred speech, last seen normal at 1645 hours. History of hypertension, hyperlipidemia, diabetes, stroke.  EXAM: CT ANGIOGRAPHY HEAD AND NECK  TECHNIQUE: Multidetector CT imaging of the head and neck was performed using the standard protocol during bolus administration of intravenous contrast. Multiplanar CT image reconstructions and MIPs were obtained to evaluate the vascular anatomy. Carotid stenosis measurements (when applicable) are obtained utilizing NASCET criteria, using the distal internal carotid diameter as  the denominator.  CONTRAST:  57mL OMNIPAQUE IOHEXOL 350 MG/ML SOLN  COMPARISON:  MRI of the brain November 19, 2006  FINDINGS: CT HEAD  Brain: Mild to moderately motion degraded examination, particularly degrades evaluation skull base.  No intraparenchymal hemorrhage, mass effect, midline shift or acute large vascular territory infarct. LEFT frontoparietal encephalomalacia. Patchy to confluent supratentorial white matter hypodensities. Moderate to severe ventriculomegaly, likely on the basis of global parenchymal brain volume loss as there is overall commensurate enlargement of cerebral sulci and cerebellar folia. Remote RIGHT cystic basal ganglia and thalamus lacunar  infarcts.  Basal cisterns are patent. Moderate calcific atherosclerosis of the carotid siphons.  Calvarium and skull base: No skull fracture though, assessment limited at the skull base. Patient is edentulous.  Paranasal sinuses: Probable maxillary sinusitis, limited by motion. Limited assessment of mastoid air cells.  Orbits:   Status post bilateral ocular lens implants.  CTA NECK  Normal appearance of the thoracic arch, normal branch pattern. Mild to moderate calcific atherosclerosis. The origins of the innominate, left Common carotid artery and subclavian artery are widely patent.  Bilateral Common carotid arteries are widely patent, coursing in a straight line fashion. Eccentric intimal thickening results in less than 40% stenosis of LEFT internal carotid artery. Mild calcific atherosclerosis of the RIGHT Common carotid artery. 2-3 mm eccentric intimal thickening and calcific atherosclerosis of the carotid bulbs extending to the internal carotid artery origins. Normal appearance of the carotid bifurcations without hemodynamically significant stenosis by NASCET criteria. Approximately 40% stenosis of LEFT internal carotid artery origin. Normal appearance of the included internal carotid arteries.  Left vertebral artery is dominant. Atherosclerosis of the origin results in approximately 50% narrowing of RIGHT vertebral artery origin. The vertebral arteries  No dissection, no pseudoaneurysm. No abnormal luminal irregularity. No contrast extravasation.  9 mm LEFT thyroid nodule, below size surveillance recommendations. Fatty parotid glands. Patient is edentulous. Bilateral maxillary sinus mucosal thickening with air-fluid levels. No acute osseous process though bone windows have not been submitted.  CTA HEAD  Anterior circulation: Normal appearance of the cervical internal carotid arteries, petrous, cavernous and supra clinoid internal carotid arteries. Widely patent anterior communicating artery. Normal appearance  of the anterior and middle cerebral arteries. Dolichoectatic intracranial vessels.  Posterior circulation: The LEFT vertebral artery is dominant with normal appearance of the vertebral arteries, vertebrobasilar junction and basilar artery, as well as main branch vessels. RIGHT posterior inferior cerebellar artery origin infundibulum. Large, robust bilateral posterior communicating arteries contribute the predominant vascular supply to the posterior cerebral arteries which are widely patent. Dolichoectatic intracranial vessels.  No large vessel occlusion, hemodynamically significant stenosis, dissection, luminal irregularity, contrast extravasation or aneurysm within the anterior nor posterior circulation.  IMPRESSION: CT HEAD:  Mild to moderate motion degraded examination.  No acute intracranial process. LEFT frontoparietal encephalomalacia suggest remote LEFT middle cerebral artery territory infarct.  Moderate to severe parenchymal brain volume loss. Moderate to severe white matter changes suggest chronic small vessel ischemic disease. Chronic appearing RIGHT basal ganglia and RIGHT thalamus lacunar infarcts.  CTA NECK: Atherosclerosis of the carotid bulbs, with up to 40% stenosis of LEFT internal carotid artery by NASCET criteria.  Approximately 50% stenosis of vertebral artery origin.  Acute maxillary sinusitis.  CTA HEAD: Complete circle of Willis without large vessel occlusion or hemodynamically significant stenosis.  Dolichoectatic intracranial vessels suggests sequelae of chronic hypertension.  Acute findings discussed with and reconfirmed by Dr.PETER SUMNER on 10/25/2014 at 10:59 pm.   Electronically Signed   By: Elon Alas  On: 10/25/2014 23:00    EKG: Independently reviewed. normal sinus rhythm and Q waves in anterior leads unchanged from prior  Assessment/Plan Active Problems:    1. Right hemiparesis -Suspected left MCA stroke -Admit to telemetry, check MRI/MRA brain -Check 2-D echo and  carotid duplex Change aspirin to 325 mg or 300 mg PR  -Neurology following -PTOT and speech therapy evaluations  2. Renal insufficiency -Acute and chronic versus progressive CK D -Check renal ultrasound, unfortunately received contrast for CTA today -Avoid nephrotoxic agents, IV fluids using normal saline at 75 mL an hour. -Monitor bmet and urine output-  3. Uncontrolled diabetes/hyperglycemia  - follow-up HbA1c  - Lantus tonight and resume daily at bedtime , sliding scale insulin   4. History of CAD -Continue aspirin, metoprolol, statin  Code Status: Full code  DVT Prophylaxis:Lovenox  Family Communication: Discussed with wife at bedside Disposition Plan: Admit to telemetry   Time spent:41min  [facility] Pager [phone]

## 2014-10-26 NOTE — Progress Notes (Signed)
*  PRELIMINARY RESULTS* Vascular Ultrasound Carotid Duplex (Doppler) has been completed.   Findings suggest 1-39% right internal carotid artery stenosis and upper range 1-39% versus low range 40-59% stenosis of the left internal carotid artery. Vertebral arteries are patent with antegrade flow.   Bilateral lower extremity venous duplex completed. Study was technically difficult due to poor patient cooperation and posterior acoustic shadowing from lower extremity arteries. Visualized veins of bilateral lower extremities which were able to be compressed are negative for deep vein thrombosis. Unable to perform compression maneuvers on some segments due to poor patient cooperation and guarding. There is no evidence of Baker's cyst bilaterally.  10/26/2014  Maudry Mayhew, RVT, RDCS, RDMS

## 2014-10-26 NOTE — Progress Notes (Addendum)
[facility] PROGRESS NOTE  [name] [name] NWG:[id number] DOB: 1940-10-09 DOA: 10/25/2014 PCP: Stephens Shire, MD  Assessment/Plan: Active Problems:   Coronary atherosclerosis   Cerebral artery occlusion with cerebral infarction   Disorder resulting from impaired renal function   Stroke   Right hemiparesis   Diabetes mellitus      1. Acute left MCA stroke Right hemiparesis History of prior CVA in 2008, on aspirin 81 mg at home Continue telemetry, 2-D echo pending   carotid duplex pending Currently on aspirin to 325 mg, may need to be switched to Plavix -Neurology following -PTOT and speech therapy evaluations pending History of SVT-TEE and loop recorder Friday a.m  2. Probable chronic kidney disease, stage III Creatinine improving with IV hydration Negative renal ultrasound, unfortunately received contrast for CTA  -Avoid nephrotoxic agents, continue 75 mL an hour. -Monitor bmet and urine output-  3. Uncontrolled diabetes/hyperglycemia  - follow-up HbA1c  Continue Lantus and sliding scale insulin   4. History of CAD -Continue aspirin, metoprolol, statin  5. Hypertriglyceridemia, triglycerides 197<321,  4 years ago, continue Pravachol  Code Status: full Family Communication: family updated about patient's clinical progress Disposition Plan:  May need SNF   Brief narrative: [name] [name] is a 74 y.o. male with past medical history of prior embolic CVA with residual right-sided weakness,, SVT, CK D3, type 2 diabetes, CAD, hypertension, dyslipidemia presents today for the above complaint. His wife noticed slurring of speech when she called him today from work and right-sided weakness, worsened from his baseline right-sided weakness. His speech has slowly started improving he also had a CT angiogram done per neuro recommendations. CT head with no acute intracranial abnormalities. in addition also noted to have CBG in the 300s and crhe eatinine of 3, last  creatinine in our system was 1.8 from 2011, reportedly he has seen a nephrologist with [facility] and is followed by his PCP for CKD  Consultants:  Neurology  Procedures:  None  Antibiotics: None  HPI/Subjective: Patient has slurred speech, right-sided weakness  Objective: Filed Vitals:   10/26/14 0200 10/26/14 0400 10/26/14 0600 10/26/14 0844  BP: 146/69 145/63 146/67 158/63  Pulse: 80 75 64 81  Temp: 97.5 F (36.4 C) 98.2 F (36.8 C) 98 F (36.7 C) 97.7 F (36.5 C)  TempSrc: Oral Oral Oral Oral  Resp: 18 14 18 18   Height:      Weight:      SpO2: 96% 96% 95%     Intake/Output Summary (Last 24 hours) at 10/26/14 5784 Last data filed at 10/26/14 0258  Gross per 24 hour  Intake      0 ml  Output    550 ml  Net   -550 ml    Exam:  General: No acute respiratory distress Lungs: Clear to auscultation bilaterally without wheezes or crackles Cardiovascular: Regular rate and rhythm without murmur gallop or rub normal S1 and S2 Abdomen: Nontender, nondistended, soft, bowel sounds positive, no rebound, no ascites, no appreciable mass Extremities: No significant cyanosis, clubbing, or edema bilateral lower extremities Neurology Patient able to answer questions but is somewhat difficult to understand. Able to squeeze on left and wiggle both feet. Flaccid on right upper extremity.      Data Reviewed: Basic Metabolic Panel:  Recent Labs Lab 10/25/14 2210 10/25/14 2219 10/26/14 0500  NA 140 138 141  K 4.6 4.3 3.8  CL 106 107 108  CO2  --  27 24  GLUCOSE 316* 302* 107*  BUN 43* 41* 36*  CREATININE 3.00* 3.03* 2.74*  CALCIUM  --  8.7 9.0    Liver Function Tests:  Recent Labs Lab 10/25/14 2219  AST 19  ALT 19  ALKPHOS 46  BILITOT 0.3  PROT 6.0  ALBUMIN 2.9*   No results for input(s): LIPASE, AMYLASE in the last 168 hours. No results for input(s): AMMONIA in the last 168 hours.  CBC:  Recent Labs Lab 10/25/14 2210 10/25/14 2219  WBC  --   10.0  NEUTROABS  --  5.2  HGB 12.9* 11.8*  HCT 38.0* 36.1*  MCV  --  88.0  PLT  --  263    Cardiac Enzymes: No results for input(s): CKTOTAL, CKMB, CKMBINDEX, TROPONINI in the last 168 hours. BNP (last 3 results) No results for input(s): BNP in the last 8760 hours.  ProBNP (last 3 results) No results for input(s): PROBNP in the last 8760 hours.    CBG:  Recent Labs Lab 10/26/14 0409 10/26/14 0845  GLUCAP 120* 101*    No results found for this or any previous visit (from the past 240 hour(s)).   Studies: Ct Angio Head W/cm &/or Wo Cm  10/25/2014   CLINICAL DATA:  RIGHT arm weakness, slurred speech, last seen normal at 1645 hours. History of hypertension, hyperlipidemia, diabetes, stroke.  EXAM: CT ANGIOGRAPHY HEAD AND NECK  TECHNIQUE: Multidetector CT imaging of the head and neck was performed using the standard protocol during bolus administration of intravenous contrast. Multiplanar CT image reconstructions and MIPs were obtained to evaluate the vascular anatomy. Carotid stenosis measurements (when applicable) are obtained utilizing NASCET criteria, using the distal internal carotid diameter as the denominator.  CONTRAST:  27mL OMNIPAQUE IOHEXOL 350 MG/ML SOLN  COMPARISON:  MRI of the brain November 19, 2006  FINDINGS: CT HEAD  Brain: Mild to moderately motion degraded examination, particularly degrades evaluation skull base.  No intraparenchymal hemorrhage, mass effect, midline shift or acute large vascular territory infarct. LEFT frontoparietal encephalomalacia. Patchy to confluent supratentorial white matter hypodensities. Moderate to severe ventriculomegaly, likely on the basis of global parenchymal brain volume loss as there is overall commensurate enlargement of cerebral sulci and cerebellar folia. Remote RIGHT cystic basal ganglia and thalamus lacunar infarcts.  Basal cisterns are patent. Moderate calcific atherosclerosis of the carotid siphons.  Calvarium and skull base: No skull  fracture though, assessment limited at the skull base. Patient is edentulous.  Paranasal sinuses: Probable maxillary sinusitis, limited by motion. Limited assessment of mastoid air cells.  Orbits:   Status post bilateral ocular lens implants.  CTA NECK  Normal appearance of the thoracic arch, normal branch pattern. Mild to moderate calcific atherosclerosis. The origins of the innominate, left Common carotid artery and subclavian artery are widely patent.  Bilateral Common carotid arteries are widely patent, coursing in a straight line fashion. Eccentric intimal thickening results in less than 40% stenosis of LEFT internal carotid artery. Mild calcific atherosclerosis of the RIGHT Common carotid artery. 2-3 mm eccentric intimal thickening and calcific atherosclerosis of the carotid bulbs extending to the internal carotid artery origins. Normal appearance of the carotid bifurcations without hemodynamically significant stenosis by NASCET criteria. Approximately 40% stenosis of LEFT internal carotid artery origin. Normal appearance of the included internal carotid arteries.  Left vertebral artery is dominant. Atherosclerosis of the origin results in approximately 50% narrowing of RIGHT vertebral artery origin. The vertebral arteries  No dissection, no pseudoaneurysm. No abnormal luminal irregularity. No contrast extravasation.  9 mm LEFT thyroid nodule, below size surveillance recommendations. Fatty parotid glands.  Patient is edentulous. Bilateral maxillary sinus mucosal thickening with air-fluid levels. No acute osseous process though bone windows have not been submitted.  CTA HEAD  Anterior circulation: Normal appearance of the cervical internal carotid arteries, petrous, cavernous and supra clinoid internal carotid arteries. Widely patent anterior communicating artery. Normal appearance of the anterior and middle cerebral arteries. Dolichoectatic intracranial vessels.  Posterior circulation: The LEFT vertebral artery  is dominant with normal appearance of the vertebral arteries, vertebrobasilar junction and basilar artery, as well as main branch vessels. RIGHT posterior inferior cerebellar artery origin infundibulum. Large, robust bilateral posterior communicating arteries contribute the predominant vascular supply to the posterior cerebral arteries which are widely patent. Dolichoectatic intracranial vessels.  No large vessel occlusion, hemodynamically significant stenosis, dissection, luminal irregularity, contrast extravasation or aneurysm within the anterior nor posterior circulation.  IMPRESSION: CT HEAD:  Mild to moderate motion degraded examination.  No acute intracranial process. LEFT frontoparietal encephalomalacia suggest remote LEFT middle cerebral artery territory infarct.  Moderate to severe parenchymal brain volume loss. Moderate to severe white matter changes suggest chronic small vessel ischemic disease. Chronic appearing RIGHT basal ganglia and RIGHT thalamus lacunar infarcts.  CTA NECK: Atherosclerosis of the carotid bulbs, with up to 40% stenosis of LEFT internal carotid artery by NASCET criteria.  Approximately 50% stenosis of vertebral artery origin.  Acute maxillary sinusitis.  CTA HEAD: Complete circle of Willis without large vessel occlusion or hemodynamically significant stenosis.  Dolichoectatic intracranial vessels suggests sequelae of chronic hypertension.  Acute findings discussed with and reconfirmed by Dr.PETER SUMNER on 10/25/2014 at 10:59 pm.   Electronically Signed   By: Elon Alas   On: 10/25/2014 23:00   Ct Angio Neck W/cm &/or Wo/cm  10/25/2014   CLINICAL DATA:  RIGHT arm weakness, slurred speech, last seen normal at 1645 hours. History of hypertension, hyperlipidemia, diabetes, stroke.  EXAM: CT ANGIOGRAPHY HEAD AND NECK  TECHNIQUE: Multidetector CT imaging of the head and neck was performed using the standard protocol during bolus administration of intravenous contrast. Multiplanar CT  image reconstructions and MIPs were obtained to evaluate the vascular anatomy. Carotid stenosis measurements (when applicable) are obtained utilizing NASCET criteria, using the distal internal carotid diameter as the denominator.  CONTRAST:  73mL OMNIPAQUE IOHEXOL 350 MG/ML SOLN  COMPARISON:  MRI of the brain November 19, 2006  FINDINGS: CT HEAD  Brain: Mild to moderately motion degraded examination, particularly degrades evaluation skull base.  No intraparenchymal hemorrhage, mass effect, midline shift or acute large vascular territory infarct. LEFT frontoparietal encephalomalacia. Patchy to confluent supratentorial white matter hypodensities. Moderate to severe ventriculomegaly, likely on the basis of global parenchymal brain volume loss as there is overall commensurate enlargement of cerebral sulci and cerebellar folia. Remote RIGHT cystic basal ganglia and thalamus lacunar infarcts.  Basal cisterns are patent. Moderate calcific atherosclerosis of the carotid siphons.  Calvarium and skull base: No skull fracture though, assessment limited at the skull base. Patient is edentulous.  Paranasal sinuses: Probable maxillary sinusitis, limited by motion. Limited assessment of mastoid air cells.  Orbits:   Status post bilateral ocular lens implants.  CTA NECK  Normal appearance of the thoracic arch, normal branch pattern. Mild to moderate calcific atherosclerosis. The origins of the innominate, left Common carotid artery and subclavian artery are widely patent.  Bilateral Common carotid arteries are widely patent, coursing in a straight line fashion. Eccentric intimal thickening results in less than 40% stenosis of LEFT internal carotid artery. Mild calcific atherosclerosis of the RIGHT Common carotid artery. 2-3 mm  eccentric intimal thickening and calcific atherosclerosis of the carotid bulbs extending to the internal carotid artery origins. Normal appearance of the carotid bifurcations without hemodynamically significant  stenosis by NASCET criteria. Approximately 40% stenosis of LEFT internal carotid artery origin. Normal appearance of the included internal carotid arteries.  Left vertebral artery is dominant. Atherosclerosis of the origin results in approximately 50% narrowing of RIGHT vertebral artery origin. The vertebral arteries  No dissection, no pseudoaneurysm. No abnormal luminal irregularity. No contrast extravasation.  9 mm LEFT thyroid nodule, below size surveillance recommendations. Fatty parotid glands. Patient is edentulous. Bilateral maxillary sinus mucosal thickening with air-fluid levels. No acute osseous process though bone windows have not been submitted.  CTA HEAD  Anterior circulation: Normal appearance of the cervical internal carotid arteries, petrous, cavernous and supra clinoid internal carotid arteries. Widely patent anterior communicating artery. Normal appearance of the anterior and middle cerebral arteries. Dolichoectatic intracranial vessels.  Posterior circulation: The LEFT vertebral artery is dominant with normal appearance of the vertebral arteries, vertebrobasilar junction and basilar artery, as well as main branch vessels. RIGHT posterior inferior cerebellar artery origin infundibulum. Large, robust bilateral posterior communicating arteries contribute the predominant vascular supply to the posterior cerebral arteries which are widely patent. Dolichoectatic intracranial vessels.  No large vessel occlusion, hemodynamically significant stenosis, dissection, luminal irregularity, contrast extravasation or aneurysm within the anterior nor posterior circulation.  IMPRESSION: CT HEAD:  Mild to moderate motion degraded examination.  No acute intracranial process. LEFT frontoparietal encephalomalacia suggest remote LEFT middle cerebral artery territory infarct.  Moderate to severe parenchymal brain volume loss. Moderate to severe white matter changes suggest chronic small vessel ischemic disease. Chronic  appearing RIGHT basal ganglia and RIGHT thalamus lacunar infarcts.  CTA NECK: Atherosclerosis of the carotid bulbs, with up to 40% stenosis of LEFT internal carotid artery by NASCET criteria.  Approximately 50% stenosis of vertebral artery origin.  Acute maxillary sinusitis.  CTA HEAD: Complete circle of Willis without large vessel occlusion or hemodynamically significant stenosis.  Dolichoectatic intracranial vessels suggests sequelae of chronic hypertension.  Acute findings discussed with and reconfirmed by Dr.PETER SUMNER on 10/25/2014 at 10:59 pm.   Electronically Signed   By: Elon Alas   On: 10/25/2014 23:00   Mr Brain Wo Contrast  10/26/2014   CLINICAL DATA:  Right-sided weakness with slurred speech which began 10/25/2014. History of hypertension and diabetes. Initial encounter.  EXAM: MRI HEAD WITHOUT CONTRAST  TECHNIQUE: Multiplanar, multiecho pulse sequences of the brain and surrounding structures were obtained without intravenous contrast.  COMPARISON:  CT angio head neck 10/25/2014.  MRI brain 11/19/2006.  FINDINGS: Moderate-sized area of restricted diffusion affects the LEFT posterior frontal cortex and subcortical white matter representing acute infarction. No other areas of restricted diffusion are observed.  Generalized atrophy with prominence of the ventricles, cisterns, and sulci. Moderately extensive chronic microvascular ischemic change throughout the periventricular and subcortical white matter. LEFT frontoparietal encephalomalacia is asymmetric, with the patulous LEFT sylvian fissure, likely remote chronic ischemic insults. Similar chronic lacunar insults are seen in the RIGHT thalamus and RIGHT basal ganglia.  Flow voids are maintained. No foci of acute or chronic hemorrhage. Significant BILATERAL maxillary chronic paranasal sinus disease with both mucosal thickening as well as RIGHT maxillary air-fluid level suggesting acuity. No orbital findings. No mastoid fluid.  Good general  agreement with prior CTA. Compared with the prior MR from 2008, there is significant progression of atrophy/encephalomalacia and small vessel disease. Multiple acute infarcts were also present at that time.  IMPRESSION: Acute  LEFT MCA territory posterior frontal cortical and subcortical white matter infarction. No hemorrhage or mass effect.  Progression of generalized atrophy and small vessel disease with sequelae of multiple prior infarcts which were acute in 2008.  No large vessel occlusion is evident.  Acute and chronic maxillary sinus disease.   Electronically Signed   By: Rolla Flatten M.D.   On: 10/26/2014 08:48   US Renal  10/26/2014   CLINICAL DATA:  Chronic kidney disease  EXAM: RENAL/URINARY TRACT ULTRASOUND COMPLETE  COMPARISON:  None.  FINDINGS: Right Kidney:  Length: 10.6 cm.  No mass or hydronephrosis.  Left Kidney:  Length: 11.0 cm.  No mass or hydronephrosis.  Bladder:  Within normal limits.  IMPRESSION: Negative renal ultrasound.   Electronically Signed   By: Julian Hy M.D.   On: 10/26/2014 08:15    Scheduled Meds: . aspirin  300 mg Rectal Daily   Or  . aspirin  325 mg Oral Daily  . enoxaparin (LOVENOX) injection  30 mg Subcutaneous Q24H  . insulin aspart  0-15 Units Subcutaneous 6 times per day  . insulin glargine  20 Units Subcutaneous Daily  . insulin glargine  20 Units Subcutaneous QHS  . metoprolol succinate  50 mg Oral Daily  . pravastatin  80 mg Oral Daily   Continuous Infusions: . sodium chloride 75 mL/hr at 10/26/14 0609    Active Problems:   Coronary atherosclerosis   Cerebral artery occlusion with cerebral infarction   Disorder resulting from impaired renal function   Stroke   Right hemiparesis   Diabetes mellitus    Time spent: 40 minutes   [facility] Pager [phone]. If 7PM-7AM, please contact night-coverage at [email or web address], password [facility] 10/26/2014, 9:52 AM  LOS: 1 day

## 2014-10-26 NOTE — Progress Notes (Signed)
Pt arrived to room via stretcher from ED. In room 4N31. Unable to ambulate to bed. Family at bedside. Alert and oriented x4. Tele placed and CCMD notified on box [id number]. Oriented to room and equipment. Discussed importance for calling for help if needing to get up. Call bell within reach. VS currently stable and will be continued to be monitored.

## 2014-10-26 NOTE — ED Provider Notes (Signed)
CSN: [id number]     Arrival date & time 10/25/14  2155 History   First MD Initiated Contact with Patient 10/25/14 2159     Chief Complaint  Patient presents with  . Code Stroke   Level  5 caveat due to some difficulty speaking. Also urgent need for intervention. '@EDPCLEARED' @ (Consider location/radiation/quality/duration/timing/severity/associated sxs/prior Treatment) The history is provided by the patient.  patient presents with inability to move his right arm and some difficulty speaking. Previous history of stroke with some apparent right-sided weakness but less weakness. At 445 he was last normal. No headache. No confusion. Some difficulty speaking. speech more slurred.  Past Medical History  Diagnosis Date  . SUPRAVENTRICULAR TACHYCARDIA   . MYOCARDIAL INFARCTION   . HYPERTENSION   . HYPERLIPIDEMIA   . CEREBROVASCULAR DISEASE   . CEREBROVASCULAR ACCIDENT   . CAD   . RENAL INSUFFICIENCY   . DIABETES MELLITUS    Past Surgical History  Procedure Laterality Date  . Coronary stent placement     History reviewed. No pertinent family history. History  Substance Use Topics  . Smoking status: Former Research scientist (life sciences)  . Smokeless tobacco: Not on file  . Alcohol Use: No    Review of Systems  Unable to perform ROS Respiratory: Negative for shortness of breath.   Cardiovascular: Negative for chest pain.      Allergies  Review of patient's allergies indicates no known allergies.  Home Medications   Prior to Admission medications   Medication Sig Start Date End Date Taking? Authorizing Provider  amLODipine (NORVASC) 5 MG tablet Take 5 mg by mouth daily.      Historical Provider, MD  aspirin 81 MG tablet Take 81 mg by mouth daily.      Historical Provider, MD  glipiZIDE (GLUCOTROL) 5 MG tablet Take 5 mg by mouth daily.      Historical Provider, MD  insulin glargine (LANTUS) 100 UNIT/ML injection Inject into the skin as directed.      Historical Provider, MD  metoprolol (TOPROL-XL) 50  MG 24 hr tablet Take 50 mg by mouth daily.      Historical Provider, MD  niacin (NIASPAN) 1000 MG CR tablet Take 1,000 mg by mouth at bedtime.      Historical Provider, MD  pravastatin (PRAVACHOL) 80 MG tablet TAKE 1 TABLET DAILY 05/06/11   [name], MD   BP 155/78 mmHg  Pulse 88  Temp(Src) 98.4 F (36.9 C) (Oral)  Resp 15  Ht '5\' 6"'  (1.676 m)  Wt 188 lb 11.4 oz (85.599 kg)  BMI 30.47 kg/m2  SpO2 98% Physical Exam  Constitutional: He appears well-developed and well-nourished.  HENT:  Head: Normocephalic.  Cardiovascular: Normal rate and regular rhythm.   Pulmonary/Chest: Effort normal.  Abdominal: Soft.  Musculoskeletal: Normal range of motion.  Neurological: He is alert.  Patient able to answer questions but is somewhat difficult to understand. Able to squeeze on left and wiggle both feet. Flaccid on right upper extremity. Complete NIH score done by neurology.    ED Course  Procedures (including critical care time) Labs Review Labs Reviewed  CBC - Abnormal; Notable for the following:    RBC 4.10 (*)    Hemoglobin 11.8 (*)    HCT 36.1 (*)    All other components within normal limits  COMPREHENSIVE METABOLIC PANEL - Abnormal; Notable for the following:    Glucose, Bld 302 (*)    BUN 41 (*)    Creatinine, Ser 3.03 (*)    Albumin  2.9 (*)    GFR calc non Af Amer 19 (*)    GFR calc Af Amer 22 (*)    Anion gap 4 (*)    All other components within normal limits  URINALYSIS, ROUTINE W REFLEX MICROSCOPIC - Abnormal; Notable for the following:    Glucose, UA >1000 (*)    All other components within normal limits  I-STAT CHEM 8, ED - Abnormal; Notable for the following:    BUN 43 (*)    Creatinine, Ser 3.00 (*)    Glucose, Bld 316 (*)    Hemoglobin 12.9 (*)    HCT 38.0 (*)    All other components within normal limits  ETHANOL  PROTIME-INR  APTT  DIFFERENTIAL  URINE RAPID DRUG SCREEN (HOSP PERFORMED)  URINE MICROSCOPIC-ADD ON  I-STAT TROPOININ, ED  I-STAT  TROPOININ, ED  I-STAT CHEM 8, ED    Imaging Review Ct Angio Head W/cm &/or Wo Cm  10/25/2014   CLINICAL DATA:  RIGHT arm weakness, slurred speech, last seen normal at 1645 hours. History of hypertension, hyperlipidemia, diabetes, stroke.  EXAM: CT ANGIOGRAPHY HEAD AND NECK  TECHNIQUE: Multidetector CT imaging of the head and neck was performed using the standard protocol during bolus administration of intravenous contrast. Multiplanar CT image reconstructions and MIPs were obtained to evaluate the vascular anatomy. Carotid stenosis measurements (when applicable) are obtained utilizing NASCET criteria, using the distal internal carotid diameter as the denominator.  CONTRAST:  74m OMNIPAQUE IOHEXOL 350 MG/ML SOLN  COMPARISON:  MRI of the brain November 19, 2006  FINDINGS: CT HEAD  Brain: Mild to moderately motion degraded examination, particularly degrades evaluation skull base.  No intraparenchymal hemorrhage, mass effect, midline shift or acute large vascular territory infarct. LEFT frontoparietal encephalomalacia. Patchy to confluent supratentorial white matter hypodensities. Moderate to severe ventriculomegaly, likely on the basis of global parenchymal brain volume loss as there is overall commensurate enlargement of cerebral sulci and cerebellar folia. Remote RIGHT cystic basal ganglia and thalamus lacunar infarcts.  Basal cisterns are patent. Moderate calcific atherosclerosis of the carotid siphons.  Calvarium and skull base: No skull fracture though, assessment limited at the skull base. Patient is edentulous.  Paranasal sinuses: Probable maxillary sinusitis, limited by motion. Limited assessment of mastoid air cells.  Orbits:   Status post bilateral ocular lens implants.  CTA NECK  Normal appearance of the thoracic arch, normal branch pattern. Mild to moderate calcific atherosclerosis. The origins of the innominate, left Common carotid artery and subclavian artery are widely patent.  Bilateral Common  carotid arteries are widely patent, coursing in a straight line fashion. Eccentric intimal thickening results in less than 40% stenosis of LEFT internal carotid artery. Mild calcific atherosclerosis of the RIGHT Common carotid artery. 2-3 mm eccentric intimal thickening and calcific atherosclerosis of the carotid bulbs extending to the internal carotid artery origins. Normal appearance of the carotid bifurcations without hemodynamically significant stenosis by NASCET criteria. Approximately 40% stenosis of LEFT internal carotid artery origin. Normal appearance of the included internal carotid arteries.  Left vertebral artery is dominant. Atherosclerosis of the origin results in approximately 50% narrowing of RIGHT vertebral artery origin. The vertebral arteries  No dissection, no pseudoaneurysm. No abnormal luminal irregularity. No contrast extravasation.  9 mm LEFT thyroid nodule, below size surveillance recommendations. Fatty parotid glands. Patient is edentulous. Bilateral maxillary sinus mucosal thickening with air-fluid levels. No acute osseous process though bone windows have not been submitted.  CTA HEAD  Anterior circulation: Normal appearance of the cervical internal carotid  arteries, petrous, cavernous and supra clinoid internal carotid arteries. Widely patent anterior communicating artery. Normal appearance of the anterior and middle cerebral arteries. Dolichoectatic intracranial vessels.  Posterior circulation: The LEFT vertebral artery is dominant with normal appearance of the vertebral arteries, vertebrobasilar junction and basilar artery, as well as main branch vessels. RIGHT posterior inferior cerebellar artery origin infundibulum. Large, robust bilateral posterior communicating arteries contribute the predominant vascular supply to the posterior cerebral arteries which are widely patent. Dolichoectatic intracranial vessels.  No large vessel occlusion, hemodynamically significant stenosis,  dissection, luminal irregularity, contrast extravasation or aneurysm within the anterior nor posterior circulation.  IMPRESSION: CT HEAD:  Mild to moderate motion degraded examination.  No acute intracranial process. LEFT frontoparietal encephalomalacia suggest remote LEFT middle cerebral artery territory infarct.  Moderate to severe parenchymal brain volume loss. Moderate to severe white matter changes suggest chronic small vessel ischemic disease. Chronic appearing RIGHT basal ganglia and RIGHT thalamus lacunar infarcts.  CTA NECK: Atherosclerosis of the carotid bulbs, with up to 40% stenosis of LEFT internal carotid artery by NASCET criteria.  Approximately 50% stenosis of vertebral artery origin.  Acute maxillary sinusitis.  CTA HEAD: Complete circle of Willis without large vessel occlusion or hemodynamically significant stenosis.  Dolichoectatic intracranial vessels suggests sequelae of chronic hypertension.  Acute findings discussed with and reconfirmed by Dr.PETER SUMNER on 10/25/2014 at 10:59 pm.   Electronically Signed   By: Elon Alas   On: 10/25/2014 23:00   Ct Angio Neck W/cm &/or Wo/cm  10/25/2014   CLINICAL DATA:  RIGHT arm weakness, slurred speech, last seen normal at 1645 hours. History of hypertension, hyperlipidemia, diabetes, stroke.  EXAM: CT ANGIOGRAPHY HEAD AND NECK  TECHNIQUE: Multidetector CT imaging of the head and neck was performed using the standard protocol during bolus administration of intravenous contrast. Multiplanar CT image reconstructions and MIPs were obtained to evaluate the vascular anatomy. Carotid stenosis measurements (when applicable) are obtained utilizing NASCET criteria, using the distal internal carotid diameter as the denominator.  CONTRAST:  50m OMNIPAQUE IOHEXOL 350 MG/ML SOLN  COMPARISON:  MRI of the brain November 19, 2006  FINDINGS: CT HEAD  Brain: Mild to moderately motion degraded examination, particularly degrades evaluation skull base.  No  intraparenchymal hemorrhage, mass effect, midline shift or acute large vascular territory infarct. LEFT frontoparietal encephalomalacia. Patchy to confluent supratentorial white matter hypodensities. Moderate to severe ventriculomegaly, likely on the basis of global parenchymal brain volume loss as there is overall commensurate enlargement of cerebral sulci and cerebellar folia. Remote RIGHT cystic basal ganglia and thalamus lacunar infarcts.  Basal cisterns are patent. Moderate calcific atherosclerosis of the carotid siphons.  Calvarium and skull base: No skull fracture though, assessment limited at the skull base. Patient is edentulous.  Paranasal sinuses: Probable maxillary sinusitis, limited by motion. Limited assessment of mastoid air cells.  Orbits:   Status post bilateral ocular lens implants.  CTA NECK  Normal appearance of the thoracic arch, normal branch pattern. Mild to moderate calcific atherosclerosis. The origins of the innominate, left Common carotid artery and subclavian artery are widely patent.  Bilateral Common carotid arteries are widely patent, coursing in a straight line fashion. Eccentric intimal thickening results in less than 40% stenosis of LEFT internal carotid artery. Mild calcific atherosclerosis of the RIGHT Common carotid artery. 2-3 mm eccentric intimal thickening and calcific atherosclerosis of the carotid bulbs extending to the internal carotid artery origins. Normal appearance of the carotid bifurcations without hemodynamically significant stenosis by NASCET criteria. Approximately 40% stenosis of LEFT  internal carotid artery origin. Normal appearance of the included internal carotid arteries.  Left vertebral artery is dominant. Atherosclerosis of the origin results in approximately 50% narrowing of RIGHT vertebral artery origin. The vertebral arteries  No dissection, no pseudoaneurysm. No abnormal luminal irregularity. No contrast extravasation.  9 mm LEFT thyroid nodule, below  size surveillance recommendations. Fatty parotid glands. Patient is edentulous. Bilateral maxillary sinus mucosal thickening with air-fluid levels. No acute osseous process though bone windows have not been submitted.  CTA HEAD  Anterior circulation: Normal appearance of the cervical internal carotid arteries, petrous, cavernous and supra clinoid internal carotid arteries. Widely patent anterior communicating artery. Normal appearance of the anterior and middle cerebral arteries. Dolichoectatic intracranial vessels.  Posterior circulation: The LEFT vertebral artery is dominant with normal appearance of the vertebral arteries, vertebrobasilar junction and basilar artery, as well as main branch vessels. RIGHT posterior inferior cerebellar artery origin infundibulum. Large, robust bilateral posterior communicating arteries contribute the predominant vascular supply to the posterior cerebral arteries which are widely patent. Dolichoectatic intracranial vessels.  No large vessel occlusion, hemodynamically significant stenosis, dissection, luminal irregularity, contrast extravasation or aneurysm within the anterior nor posterior circulation.  IMPRESSION: CT HEAD:  Mild to moderate motion degraded examination.  No acute intracranial process. LEFT frontoparietal encephalomalacia suggest remote LEFT middle cerebral artery territory infarct.  Moderate to severe parenchymal brain volume loss. Moderate to severe white matter changes suggest chronic small vessel ischemic disease. Chronic appearing RIGHT basal ganglia and RIGHT thalamus lacunar infarcts.  CTA NECK: Atherosclerosis of the carotid bulbs, with up to 40% stenosis of LEFT internal carotid artery by NASCET criteria.  Approximately 50% stenosis of vertebral artery origin.  Acute maxillary sinusitis.  CTA HEAD: Complete circle of Willis without large vessel occlusion or hemodynamically significant stenosis.  Dolichoectatic intracranial vessels suggests sequelae of  chronic hypertension.  Acute findings discussed with and reconfirmed by Dr.PETER SUMNER on 10/25/2014 at 10:59 pm.   Electronically Signed   By: Elon Alas   On: 10/25/2014 23:00     EKG Interpretation   Date/Time:  Wednesday October 25 2014 22:19:53 EST Ventricular Rate:  89 PR Interval:  66 QRS Duration: 94 QT Interval:  374 QTC Calculation: 455 R Axis:   65 Text Interpretation:  Sinus rhythm Short PR interval Anterior infarct, old  Baseline wander in lead(s) V6 Confirmed by Alvino Chapel  MD, Ovid Curd [phone])  on 10/26/2014 12:17:55 AM      MDM   Final diagnoses:  Stroke  Renal insufficiency    Patient with stroke. Not a TPA candidate due to time of onset. Code stroke have been called and was met at the bridge by myself and neurology. I-STAT Chem-8 ordered to be done before CTAthough IV contrast was given before the results.CT did not show bleed or large vessel cause for interventional radiology intervention. Will admit to internal medicine. Seen by myself and Dr. Janann Colonel immediately     Jasper Riling. Alvino Chapel, MD 10/26/14 3403

## 2014-10-27 ENCOUNTER — Encounter (HOSPITAL_COMMUNITY)
Admission: EM | Disposition: A | Discharge status: Skilled Nursing Facility | Payer: Self-pay | Source: Home / Self Care | Attending: Internal Medicine

## 2014-10-27 ENCOUNTER — Encounter (HOSPITAL_COMMUNITY): Payer: Self-pay

## 2014-10-27 DIAGNOSIS — E1159 Type 2 diabetes mellitus with other circulatory complications: Secondary | ICD-10-CM

## 2014-10-27 DIAGNOSIS — I639 Cerebral infarction, unspecified: Secondary | ICD-10-CM

## 2014-10-27 DIAGNOSIS — I340 Nonrheumatic mitral (valve) insufficiency: Secondary | ICD-10-CM

## 2014-10-27 HISTORY — PX: TEE WITHOUT CARDIOVERSION: SHX5443

## 2014-10-27 HISTORY — PX: LOOP RECORDER IMPLANT: SHX5477

## 2014-10-27 LAB — COMPREHENSIVE METABOLIC PANEL
ALT: 23 U/L (ref 0–53)
AST: 27 U/L (ref 0–37)
Albumin: 3.1 g/dL — ABNORMAL LOW (ref 3.5–5.2)
Alkaline Phosphatase: 36 U/L — ABNORMAL LOW (ref 39–117)
Anion gap: 11 (ref 5–15)
BUN: 32 mg/dL — ABNORMAL HIGH (ref 6–23)
CALCIUM: 9.2 mg/dL (ref 8.4–10.5)
CO2: 21 mmol/L (ref 19–32)
CREATININE: 2.76 mg/dL — AB (ref 0.50–1.35)
Chloride: 108 mmol/L (ref 96–112)
GFR calc Af Amer: 24 mL/min — ABNORMAL LOW (ref >90)
GFR, EST NON AFRICAN AMERICAN: 21 mL/min — AB (ref >90)
Glucose, Bld: 111 mg/dL — ABNORMAL HIGH (ref 70–99)
POTASSIUM: 4.4 mmol/L (ref 3.5–5.1)
SODIUM: 140 mmol/L (ref 135–145)
Total Bilirubin: 0.5 mg/dL (ref 0.3–1.2)
Total Protein: 6.5 g/dL (ref 6.0–8.3)

## 2014-10-27 LAB — GLUCOSE, CAPILLARY
Glucose-Capillary: 102 mg/dL — ABNORMAL HIGH (ref 70–99)
Glucose-Capillary: 117 mg/dL — ABNORMAL HIGH (ref 70–99)
Glucose-Capillary: 130 mg/dL — ABNORMAL HIGH (ref 70–99)
Glucose-Capillary: 141 mg/dL — ABNORMAL HIGH (ref 70–99)
Glucose-Capillary: 168 mg/dL — ABNORMAL HIGH (ref 70–99)
Glucose-Capillary: 184 mg/dL — ABNORMAL HIGH (ref 70–99)

## 2014-10-27 LAB — HEMOGLOBIN A1C
HEMOGLOBIN A1C: 7.7 % — AB (ref 4.8–5.6)
MEAN PLASMA GLUCOSE: 174 mg/dL

## 2014-10-27 LAB — CBC
HEMATOCRIT: 36.7 % — AB (ref 39.0–52.0)
HEMOGLOBIN: 12 g/dL — AB (ref 13.0–17.0)
MCH: 28.8 pg (ref 26.0–34.0)
MCHC: 32.7 g/dL (ref 30.0–36.0)
MCV: 88 fL (ref 78.0–100.0)
Platelets: 257 10*3/uL (ref 150–400)
RBC: 4.17 MIL/uL — ABNORMAL LOW (ref 4.22–5.81)
RDW: 14.1 % (ref 11.5–15.5)
WBC: 9.6 10*3/uL (ref 4.0–10.5)

## 2014-10-27 SURGERY — ECHOCARDIOGRAM, TRANSESOPHAGEAL
Anesthesia: Moderate Sedation

## 2014-10-27 SURGERY — LOOP RECORDER IMPLANT
Anesthesia: LOCAL

## 2014-10-27 MED ORDER — MIDAZOLAM HCL 10 MG/2ML IJ SOLN
INTRAMUSCULAR | Status: DC | PRN
  Administered 2014-10-27: 1 mg via INTRAVENOUS
  Administered 2014-10-27: 2 mg via INTRAVENOUS

## 2014-10-27 MED ORDER — BUTAMBEN-TETRACAINE-BENZOCAINE 2-2-14 % EX AERO
INHALATION_SPRAY | CUTANEOUS | Status: DC | PRN
Start: 2014-10-27 — End: 2014-10-27
  Administered 2014-10-27: 2 via TOPICAL

## 2014-10-27 MED ORDER — CLOPIDOGREL BISULFATE 75 MG PO TABS
75.0000 mg | ORAL_TABLET | Freq: Every day | ORAL | Status: DC
  Administered 2014-10-28 – 2014-10-31 (×4): 75 mg via ORAL
  Filled 2014-10-27 (×4): qty 1

## 2014-10-27 MED ORDER — PRAVASTATIN SODIUM 40 MG PO TABS: 80.0000 mg | ORAL_TABLET | Freq: Every day | ORAL | Status: DC

## 2014-10-27 MED ORDER — MIDAZOLAM HCL 5 MG/ML IJ SOLN
INTRAMUSCULAR | Status: AC
  Filled 2014-10-27: qty 2

## 2014-10-27 MED ORDER — VITAMIN B-12 100 MCG PO TABS
100.0000 ug | ORAL_TABLET | Freq: Every day | ORAL | Status: DC
  Administered 2014-10-27 – 2014-10-31 (×5): 100 ug via ORAL
  Filled 2014-10-27 (×5): qty 1

## 2014-10-27 MED ORDER — FENTANYL CITRATE 0.05 MG/ML IJ SOLN
INTRAMUSCULAR | Status: DC | PRN
  Administered 2014-10-27: 25 ug via INTRAVENOUS

## 2014-10-27 MED ORDER — FENTANYL CITRATE 0.05 MG/ML IJ SOLN
INTRAMUSCULAR | Status: AC
  Filled 2014-10-27: qty 2

## 2014-10-27 NOTE — Progress Notes (Signed)
Rehab Admissions Coordinator Note:  Patient was screened by Retta Diones for appropriateness for an Inpatient Acute Rehab Consult.  At this time, we are recommending Inpatient Rehab consult.  Retta Diones 10/27/2014, 3:15 PM  I can be reached at [phone].

## 2014-10-27 NOTE — Evaluation (Signed)
Occupational Therapy Evaluation Patient Details Name: [name] [name] MRN: [id number] DOB: [birth year] Today's Date: 10/27/2014    History of Present Illness Patient is a 74 y/o male admitted with right sided weakness and slurred speech. PMH of prior embolic CVA with residual right-sided weakness,, SVT, CK D3, type 2 diabetes, CAD, hypertension and dyslipidemia. MRI brain-Acute LEFT MCA territory posterior frontal cortical and subcortical white matter infarction. S/p TEE 3/4.    Clinical Impression   Patient independent PTA. Patient currently functioning at an overall mod>total assist level for ADLs and functional mobility. Patient will benefit from acute OT to increase overall independence in the areas of ADLs, functional mobility, functional use of RUE, and overall safety in order to safely discharge to venue listed below.     Follow Up Recommendations  CIR;Supervision/Assistance - 24 hour    Equipment Recommendations   (TBD)    Recommendations for Other Services Rehab consult     Precautions / Restrictions Precautions Precautions: Fall Restrictions Weight Bearing Restrictions: No      Mobility - Per PT eval Bed Mobility Overal bed mobility: Needs Assistance Bed Mobility: Rolling;Sidelying to Sit Rolling: Supervision Sidelying to sit: Mod assist;HOB elevated       General bed mobility comments: Mod A to elevate trunk, use of rails.   Transfers Overall transfer level: Needs assistance Equipment used: None Transfers: Sit to/from Stand Sit to Stand: Min assist         General transfer comment: Min A to rise from EOB with cues for anterior translation. Right lateral trunk lean initially upon standing.     Balance Overall balance assessment: Needs assistance Sitting-balance support: Feet supported;No upper extremity supported Sitting balance-Leahy Scale: Fair Sitting balance - Comments: Pt with posterior trunk lean during AROM BLEs requiring Min A to stay upright.  Poor trunk control. Postural control: Posterior lean Standing balance support: During functional activity Standing balance-Leahy Scale: Fair Standing balance comment: Requires Min A-Min guard assist for static standing and Min-Mod A for dynamic standing/ambulation due to right hemiparesis.     ADL Overall ADL's : Needs assistance/impaired Eating/Feeding: Maximal assistance;Sitting Eating/Feeding Details (indicate cue type and reason): max assist with hand over hand using Rhand  Grooming: Maximal assistance;Sitting Grooming Details (indicate cue type and reason): max assist with hand over hand using Rhand Upper Body Bathing: Moderate assistance;Sitting   Lower Body Bathing: Maximal assistance;Sit to/from stand;Cueing for safety   Upper Body Dressing : Moderate assistance;Sitting   Lower Body Dressing: Maximal assistance;Sit to/from stand;Cueing for safety General ADL Comments: Patient with decreased motor and sensory function>RUE. Patient requires hand over hand assist for functional use of RUE. Educated patient on RUE AAROM exercises.      Vision Vision Assessment?: Yes Eye Alignment: Within Functional Limits Ocular Range of Motion: Within Functional Limits Alignment/Gaze Preference: Within Defined Limits Tracking/Visual Pursuits: Decreased smoothness of eye movement to RIGHT superior field;Decreased smoothness of eye movement to LEFT superior field;Impaired - to be further tested in functional context Saccades: Within functional limits Convergence: Within functional limits Visual Fields: No apparent deficits   Agricultural engineer Tested?: No   Praxis Praxis Praxis tested?: Within functional limits    Pertinent Vitals/Pain Pain Assessment: No/denies pain     Hand Dominance Right   Extremity/Trunk Assessment Upper Extremity Assessment Upper Extremity Assessment: RUE deficits/detail RUE Deficits / Details: Grossly decreased AROM and strength  distally>proximally. No digit/wrist AROM or muscle activation. Decreased sensation and awareness > RUE RUE Sensation: decreased light touch;decreased proprioception RUE Coordination: decreased fine motor;decreased  gross motor   Lower Extremity Assessment Lower Extremity Assessment: RLE deficits/detail;Generalized weakness RLE Deficits / Details: Grossly ~2+/5 hip flexion, ~3/5 knee extension/flexion and 3/5 DF. RLE Sensation:  (Sensation WFL; proprioception WFL.)       Communication Communication Communication: Other (comment) (dysarthria)   Cognition Arousal/Alertness: Awake/alert Behavior During Therapy: WFL for tasks assessed/performed Overall Cognitive Status: Within Functional Limits for tasks assessed              Home Living Family/patient expects to be discharged to:: Private residence Living Arrangements: Spouse/significant other Available Help at Discharge: Family;Available PRN/intermittently (wife  works) Type of Home: House Home Access: Stairs to enter Technical brewer of Steps: 4 Entrance Stairs-Rails: Right Home Layout: One level     Bathroom Shower/Tub: Walk-in Hydrologist: [place]: None          Prior Functioning/Environment Level of Independence: Independent             OT Diagnosis: Generalized weakness;Hemiplegia dominant side   OT Problem List: Decreased strength;Decreased range of motion;Decreased activity tolerance;Impaired balance (sitting and/or standing);Impaired vision/perception;Decreased knowledge of use of DME or AE;Decreased safety awareness;Decreased coordination;Impaired UE functional use   OT Treatment/Interventions: Self-care/ADL training;Therapeutic exercise;Neuromuscular education;Energy conservation;DME and/or AE instruction;Therapeutic activities;Visual/perceptual remediation/compensation;Patient/family education;Balance training    OT Goals(Current goals can be found in the  care plan section) Acute Rehab OT Goals Patient Stated Goal: to be able to be independent again and walk OT Goal Formulation: With patient Time For Goal Achievement: 11/03/14 Potential to Achieve Goals: Good ADL Goals Pt Will Perform Eating: with min assist;with adaptive utensils;sitting Pt Will Perform Grooming: with min assist;with adaptive equipment;sitting Pt Will Perform Upper Body Bathing: with min assist;sitting Pt Will Perform Lower Body Bathing: with mod assist;with adaptive equipment;sit to/from stand Pt Will Perform Upper Body Dressing: with min assist;sitting Pt Will Perform Lower Body Dressing: with mod assist;sit to/from stand;with adaptive equipment Pt Will Transfer to Toilet: with min assist;bedside commode;ambulating;grab bars Pt/caregiver will Perform Home Exercise Program: Increased ROM;Increased strength;Right Upper extremity;With written HEP provided;With Supervision  OT Frequency: Min 2X/week   Barriers to D/C: Decreased caregiver support          End of Session Equipment Utilized During Treatment: Gait belt;Rolling walker  Activity Tolerance: Patient tolerated treatment well Patient left: in chair;with call bell/phone within reach;with chair alarm set   Time: 7106-2694 OT Time Calculation (min): 18 min Charges:  OT General Charges $OT Visit: 1 Procedure OT Evaluation $Initial OT Evaluation Tier I: 1 Procedure  Ladrea Holladay , MS, OTR/L, CLT Pager: [phone]  10/27/2014, 3:15 PM

## 2014-10-27 NOTE — Progress Notes (Signed)
  Echocardiogram Echocardiogram Transesophageal has been performed.  Darlina Sicilian M 10/27/2014, 9:53 AM

## 2014-10-27 NOTE — CV Procedure (Signed)
Pre op Dx Cryptogenic Stroke  Post op Dx  same  Procedure  Loop Recorder implantation  After routine prep and drape of the left parasternal area, a small incision was created. A [employer] LINQ Reveal Loop Recorder  Serial Number  [id number] was inserted.    SteriStrip dressing was  applied.  The patient tolerated the procedure without apparent complication.

## 2014-10-27 NOTE — H&P (View-Only) (Signed)
[facility] PROGRESS NOTE  [name] [name] OMB:[id number] DOB: Oct 02, 1940 DOA: 10/25/2014 PCP: Stephens Shire, MD  Assessment/Plan: Active Problems:   Coronary atherosclerosis   Cerebral artery occlusion with cerebral infarction   Disorder resulting from impaired renal function   Stroke   Right hemiparesis   Diabetes mellitus      1. Acute left MCA stroke Right hemiparesis History of prior CVA in 2008, on aspirin 81 mg at home Continue telemetry, 2-D echo pending   carotid duplex pending Currently on aspirin to 325 mg, may need to be switched to Plavix -Neurology following -PTOT and speech therapy evaluations pending History of SVT-TEE and loop recorder Friday a.m  2. Probable chronic kidney disease, stage III Creatinine improving with IV hydration Negative renal ultrasound, unfortunately received contrast for CTA  -Avoid nephrotoxic agents, continue 75 mL an hour. -Monitor bmet and urine output-  3. Uncontrolled diabetes/hyperglycemia  - follow-up HbA1c  Continue Lantus and sliding scale insulin   4. History of CAD -Continue aspirin, metoprolol, statin  5. Hypertriglyceridemia, triglycerides 197<321,  4 years ago, continue Pravachol  Code Status: full Family Communication: family updated about patient's clinical progress Disposition Plan:  May need SNF   Brief narrative: [name] [name] is a 74 y.o. male with past medical history of prior embolic CVA with residual right-sided weakness,, SVT, CK D3, type 2 diabetes, CAD, hypertension, dyslipidemia presents today for the above complaint. His wife noticed slurring of speech when she called him today from work and right-sided weakness, worsened from his baseline right-sided weakness. His speech has slowly started improving he also had a CT angiogram done per neuro recommendations. CT head with no acute intracranial abnormalities. in addition also noted to have CBG in the 300s and crhe eatinine of 3, last  creatinine in our system was 1.8 from 2011, reportedly he has seen a nephrologist with [facility] and is followed by his PCP for CKD  Consultants:  Neurology  Procedures:  None  Antibiotics: None  HPI/Subjective: Patient has slurred speech, right-sided weakness  Objective: Filed Vitals:   10/26/14 0200 10/26/14 0400 10/26/14 0600 10/26/14 0844  BP: 146/69 145/63 146/67 158/63  Pulse: 80 75 64 81  Temp: 97.5 F (36.4 C) 98.2 F (36.8 C) 98 F (36.7 C) 97.7 F (36.5 C)  TempSrc: Oral Oral Oral Oral  Resp: 18 14 18 18   Height:      Weight:      SpO2: 96% 96% 95%     Intake/Output Summary (Last 24 hours) at 10/26/14 4536 Last data filed at 10/26/14 0258  Gross per 24 hour  Intake      0 ml  Output    550 ml  Net   -550 ml    Exam:  General: No acute respiratory distress Lungs: Clear to auscultation bilaterally without wheezes or crackles Cardiovascular: Regular rate and rhythm without murmur gallop or rub normal S1 and S2 Abdomen: Nontender, nondistended, soft, bowel sounds positive, no rebound, no ascites, no appreciable mass Extremities: No significant cyanosis, clubbing, or edema bilateral lower extremities Neurology Patient able to answer questions but is somewhat difficult to understand. Able to squeeze on left and wiggle both feet. Flaccid on right upper extremity.      Data Reviewed: Basic Metabolic Panel:  Recent Labs Lab 10/25/14 2210 10/25/14 2219 10/26/14 0500  NA 140 138 141  K 4.6 4.3 3.8  CL 106 107 108  CO2  --  27 24  GLUCOSE 316* 302* 107*  BUN 43* 41* 36*  CREATININE 3.00* 3.03* 2.74*  CALCIUM  --  8.7 9.0    Liver Function Tests:  Recent Labs Lab 10/25/14 2219  AST 19  ALT 19  ALKPHOS 46  BILITOT 0.3  PROT 6.0  ALBUMIN 2.9*   No results for input(s): LIPASE, AMYLASE in the last 168 hours. No results for input(s): AMMONIA in the last 168 hours.  CBC:  Recent Labs Lab 10/25/14 2210 10/25/14 2219  WBC  --   10.0  NEUTROABS  --  5.2  HGB 12.9* 11.8*  HCT 38.0* 36.1*  MCV  --  88.0  PLT  --  263    Cardiac Enzymes: No results for input(s): CKTOTAL, CKMB, CKMBINDEX, TROPONINI in the last 168 hours. BNP (last 3 results) No results for input(s): BNP in the last 8760 hours.  ProBNP (last 3 results) No results for input(s): PROBNP in the last 8760 hours.    CBG:  Recent Labs Lab 10/26/14 0409 10/26/14 0845  GLUCAP 120* 101*    No results found for this or any previous visit (from the past 240 hour(s)).   Studies: Ct Angio Head W/cm &/or Wo Cm  10/25/2014   CLINICAL DATA:  RIGHT arm weakness, slurred speech, last seen normal at 1645 hours. History of hypertension, hyperlipidemia, diabetes, stroke.  EXAM: CT ANGIOGRAPHY HEAD AND NECK  TECHNIQUE: Multidetector CT imaging of the head and neck was performed using the standard protocol during bolus administration of intravenous contrast. Multiplanar CT image reconstructions and MIPs were obtained to evaluate the vascular anatomy. Carotid stenosis measurements (when applicable) are obtained utilizing NASCET criteria, using the distal internal carotid diameter as the denominator.  CONTRAST:  97mL OMNIPAQUE IOHEXOL 350 MG/ML SOLN  COMPARISON:  MRI of the brain November 19, 2006  FINDINGS: CT HEAD  Brain: Mild to moderately motion degraded examination, particularly degrades evaluation skull base.  No intraparenchymal hemorrhage, mass effect, midline shift or acute large vascular territory infarct. LEFT frontoparietal encephalomalacia. Patchy to confluent supratentorial white matter hypodensities. Moderate to severe ventriculomegaly, likely on the basis of global parenchymal brain volume loss as there is overall commensurate enlargement of cerebral sulci and cerebellar folia. Remote RIGHT cystic basal ganglia and thalamus lacunar infarcts.  Basal cisterns are patent. Moderate calcific atherosclerosis of the carotid siphons.  Calvarium and skull base: No skull  fracture though, assessment limited at the skull base. Patient is edentulous.  Paranasal sinuses: Probable maxillary sinusitis, limited by motion. Limited assessment of mastoid air cells.  Orbits:   Status post bilateral ocular lens implants.  CTA NECK  Normal appearance of the thoracic arch, normal branch pattern. Mild to moderate calcific atherosclerosis. The origins of the innominate, left Common carotid artery and subclavian artery are widely patent.  Bilateral Common carotid arteries are widely patent, coursing in a straight line fashion. Eccentric intimal thickening results in less than 40% stenosis of LEFT internal carotid artery. Mild calcific atherosclerosis of the RIGHT Common carotid artery. 2-3 mm eccentric intimal thickening and calcific atherosclerosis of the carotid bulbs extending to the internal carotid artery origins. Normal appearance of the carotid bifurcations without hemodynamically significant stenosis by NASCET criteria. Approximately 40% stenosis of LEFT internal carotid artery origin. Normal appearance of the included internal carotid arteries.  Left vertebral artery is dominant. Atherosclerosis of the origin results in approximately 50% narrowing of RIGHT vertebral artery origin. The vertebral arteries  No dissection, no pseudoaneurysm. No abnormal luminal irregularity. No contrast extravasation.  9 mm LEFT thyroid nodule, below size surveillance recommendations. Fatty parotid glands.  Patient is edentulous. Bilateral maxillary sinus mucosal thickening with air-fluid levels. No acute osseous process though bone windows have not been submitted.  CTA HEAD  Anterior circulation: Normal appearance of the cervical internal carotid arteries, petrous, cavernous and supra clinoid internal carotid arteries. Widely patent anterior communicating artery. Normal appearance of the anterior and middle cerebral arteries. Dolichoectatic intracranial vessels.  Posterior circulation: The LEFT vertebral artery  is dominant with normal appearance of the vertebral arteries, vertebrobasilar junction and basilar artery, as well as main branch vessels. RIGHT posterior inferior cerebellar artery origin infundibulum. Large, robust bilateral posterior communicating arteries contribute the predominant vascular supply to the posterior cerebral arteries which are widely patent. Dolichoectatic intracranial vessels.  No large vessel occlusion, hemodynamically significant stenosis, dissection, luminal irregularity, contrast extravasation or aneurysm within the anterior nor posterior circulation.  IMPRESSION: CT HEAD:  Mild to moderate motion degraded examination.  No acute intracranial process. LEFT frontoparietal encephalomalacia suggest remote LEFT middle cerebral artery territory infarct.  Moderate to severe parenchymal brain volume loss. Moderate to severe white matter changes suggest chronic small vessel ischemic disease. Chronic appearing RIGHT basal ganglia and RIGHT thalamus lacunar infarcts.  CTA NECK: Atherosclerosis of the carotid bulbs, with up to 40% stenosis of LEFT internal carotid artery by NASCET criteria.  Approximately 50% stenosis of vertebral artery origin.  Acute maxillary sinusitis.  CTA HEAD: Complete circle of Willis without large vessel occlusion or hemodynamically significant stenosis.  Dolichoectatic intracranial vessels suggests sequelae of chronic hypertension.  Acute findings discussed with and reconfirmed by Dr.PETER SUMNER on 10/25/2014 at 10:59 pm.   Electronically Signed   By: Elon Alas   On: 10/25/2014 23:00   Ct Angio Neck W/cm &/or Wo/cm  10/25/2014   CLINICAL DATA:  RIGHT arm weakness, slurred speech, last seen normal at 1645 hours. History of hypertension, hyperlipidemia, diabetes, stroke.  EXAM: CT ANGIOGRAPHY HEAD AND NECK  TECHNIQUE: Multidetector CT imaging of the head and neck was performed using the standard protocol during bolus administration of intravenous contrast. Multiplanar CT  image reconstructions and MIPs were obtained to evaluate the vascular anatomy. Carotid stenosis measurements (when applicable) are obtained utilizing NASCET criteria, using the distal internal carotid diameter as the denominator.  CONTRAST:  58mL OMNIPAQUE IOHEXOL 350 MG/ML SOLN  COMPARISON:  MRI of the brain November 19, 2006  FINDINGS: CT HEAD  Brain: Mild to moderately motion degraded examination, particularly degrades evaluation skull base.  No intraparenchymal hemorrhage, mass effect, midline shift or acute large vascular territory infarct. LEFT frontoparietal encephalomalacia. Patchy to confluent supratentorial white matter hypodensities. Moderate to severe ventriculomegaly, likely on the basis of global parenchymal brain volume loss as there is overall commensurate enlargement of cerebral sulci and cerebellar folia. Remote RIGHT cystic basal ganglia and thalamus lacunar infarcts.  Basal cisterns are patent. Moderate calcific atherosclerosis of the carotid siphons.  Calvarium and skull base: No skull fracture though, assessment limited at the skull base. Patient is edentulous.  Paranasal sinuses: Probable maxillary sinusitis, limited by motion. Limited assessment of mastoid air cells.  Orbits:   Status post bilateral ocular lens implants.  CTA NECK  Normal appearance of the thoracic arch, normal branch pattern. Mild to moderate calcific atherosclerosis. The origins of the innominate, left Common carotid artery and subclavian artery are widely patent.  Bilateral Common carotid arteries are widely patent, coursing in a straight line fashion. Eccentric intimal thickening results in less than 40% stenosis of LEFT internal carotid artery. Mild calcific atherosclerosis of the RIGHT Common carotid artery. 2-3 mm  eccentric intimal thickening and calcific atherosclerosis of the carotid bulbs extending to the internal carotid artery origins. Normal appearance of the carotid bifurcations without hemodynamically significant  stenosis by NASCET criteria. Approximately 40% stenosis of LEFT internal carotid artery origin. Normal appearance of the included internal carotid arteries.  Left vertebral artery is dominant. Atherosclerosis of the origin results in approximately 50% narrowing of RIGHT vertebral artery origin. The vertebral arteries  No dissection, no pseudoaneurysm. No abnormal luminal irregularity. No contrast extravasation.  9 mm LEFT thyroid nodule, below size surveillance recommendations. Fatty parotid glands. Patient is edentulous. Bilateral maxillary sinus mucosal thickening with air-fluid levels. No acute osseous process though bone windows have not been submitted.  CTA HEAD  Anterior circulation: Normal appearance of the cervical internal carotid arteries, petrous, cavernous and supra clinoid internal carotid arteries. Widely patent anterior communicating artery. Normal appearance of the anterior and middle cerebral arteries. Dolichoectatic intracranial vessels.  Posterior circulation: The LEFT vertebral artery is dominant with normal appearance of the vertebral arteries, vertebrobasilar junction and basilar artery, as well as main branch vessels. RIGHT posterior inferior cerebellar artery origin infundibulum. Large, robust bilateral posterior communicating arteries contribute the predominant vascular supply to the posterior cerebral arteries which are widely patent. Dolichoectatic intracranial vessels.  No large vessel occlusion, hemodynamically significant stenosis, dissection, luminal irregularity, contrast extravasation or aneurysm within the anterior nor posterior circulation.  IMPRESSION: CT HEAD:  Mild to moderate motion degraded examination.  No acute intracranial process. LEFT frontoparietal encephalomalacia suggest remote LEFT middle cerebral artery territory infarct.  Moderate to severe parenchymal brain volume loss. Moderate to severe white matter changes suggest chronic small vessel ischemic disease. Chronic  appearing RIGHT basal ganglia and RIGHT thalamus lacunar infarcts.  CTA NECK: Atherosclerosis of the carotid bulbs, with up to 40% stenosis of LEFT internal carotid artery by NASCET criteria.  Approximately 50% stenosis of vertebral artery origin.  Acute maxillary sinusitis.  CTA HEAD: Complete circle of Willis without large vessel occlusion or hemodynamically significant stenosis.  Dolichoectatic intracranial vessels suggests sequelae of chronic hypertension.  Acute findings discussed with and reconfirmed by Dr.PETER SUMNER on 10/25/2014 at 10:59 pm.   Electronically Signed   By: Elon Alas   On: 10/25/2014 23:00   Mr Brain Wo Contrast  10/26/2014   CLINICAL DATA:  Right-sided weakness with slurred speech which began 10/25/2014. History of hypertension and diabetes. Initial encounter.  EXAM: MRI HEAD WITHOUT CONTRAST  TECHNIQUE: Multiplanar, multiecho pulse sequences of the brain and surrounding structures were obtained without intravenous contrast.  COMPARISON:  CT angio head neck 10/25/2014.  MRI brain 11/19/2006.  FINDINGS: Moderate-sized area of restricted diffusion affects the LEFT posterior frontal cortex and subcortical white matter representing acute infarction. No other areas of restricted diffusion are observed.  Generalized atrophy with prominence of the ventricles, cisterns, and sulci. Moderately extensive chronic microvascular ischemic change throughout the periventricular and subcortical white matter. LEFT frontoparietal encephalomalacia is asymmetric, with the patulous LEFT sylvian fissure, likely remote chronic ischemic insults. Similar chronic lacunar insults are seen in the RIGHT thalamus and RIGHT basal ganglia.  Flow voids are maintained. No foci of acute or chronic hemorrhage. Significant BILATERAL maxillary chronic paranasal sinus disease with both mucosal thickening as well as RIGHT maxillary air-fluid level suggesting acuity. No orbital findings. No mastoid fluid.  Good general  agreement with prior CTA. Compared with the prior MR from 2008, there is significant progression of atrophy/encephalomalacia and small vessel disease. Multiple acute infarcts were also present at that time.  IMPRESSION: Acute  LEFT MCA territory posterior frontal cortical and subcortical white matter infarction. No hemorrhage or mass effect.  Progression of generalized atrophy and small vessel disease with sequelae of multiple prior infarcts which were acute in 2008.  No large vessel occlusion is evident.  Acute and chronic maxillary sinus disease.   Electronically Signed   By: Rolla Flatten M.D.   On: 10/26/2014 08:48   US Renal  10/26/2014   CLINICAL DATA:  Chronic kidney disease  EXAM: RENAL/URINARY TRACT ULTRASOUND COMPLETE  COMPARISON:  None.  FINDINGS: Right Kidney:  Length: 10.6 cm.  No mass or hydronephrosis.  Left Kidney:  Length: 11.0 cm.  No mass or hydronephrosis.  Bladder:  Within normal limits.  IMPRESSION: Negative renal ultrasound.   Electronically Signed   By: Julian Hy M.D.   On: 10/26/2014 08:15    Scheduled Meds: . aspirin  300 mg Rectal Daily   Or  . aspirin  325 mg Oral Daily  . enoxaparin (LOVENOX) injection  30 mg Subcutaneous Q24H  . insulin aspart  0-15 Units Subcutaneous 6 times per day  . insulin glargine  20 Units Subcutaneous Daily  . insulin glargine  20 Units Subcutaneous QHS  . metoprolol succinate  50 mg Oral Daily  . pravastatin  80 mg Oral Daily   Continuous Infusions: . sodium chloride 75 mL/hr at 10/26/14 0609    Active Problems:   Coronary atherosclerosis   Cerebral artery occlusion with cerebral infarction   Disorder resulting from impaired renal function   Stroke   Right hemiparesis   Diabetes mellitus    Time spent: 40 minutes   [facility] Pager [phone]. If 7PM-7AM, please contact night-coverage at [email or web address], password [facility] 10/26/2014, 9:52 AM  LOS: 1 day

## 2014-10-27 NOTE — CV Procedure (Signed)
     Transesophageal Echocardiogram Note  [name] [name] [id number] 07-20-1941  Procedure: Transesophageal Echocardiogram Indications: stroke  Procedure Details Consent: Obtained Time Out: Verified patient identification, verified procedure, site/side was marked, verified correct patient position, special equipment/implants available, Radiology Safety Procedures followed,  medications/allergies/relevent history reviewed, required imaging and test results available.  Performed  Medications: Fentanyl: 25 mcg Versed: 3 mg  Left Ventrical:  Normal size and function, no WMA.  Mitral Valve: Mild MR.  Aortic Valve: Trace AI.  Tricuspid Valve: Normal.  Pulmonic Valve: Norma;.  Left Atrium/ Left atrial appendage: No thrombus, normal emptying velicities.  Atrial septum: No PFO.   Aorta: Severe non-mobile plague.   Complications: No apparent complications Patient did tolerate procedure well.  Dorothy Spark, MD, [facility] 10/27/2014, 9:19 AM

## 2014-10-27 NOTE — Progress Notes (Signed)
PT Cancellation Note  Patient Details Name: [name] [name] MRN: [id number] DOB: 07-Mar-1941   Cancelled Treatment:    Reason Eval/Treat Not Completed: Patient at procedure or test/unavailable Pt off floor getting procedure. Will follow up with PT evaluation next available time.   Candy Sledge A 10/27/2014, 8:57 AM Candy Sledge, PT, DPT [phone]

## 2014-10-27 NOTE — Progress Notes (Addendum)
[facility] PROGRESS NOTE  [name] [name] KNL:[id number] DOB: 1940-09-17 DOA: 10/25/2014 PCP: Stephens Shire, MD  Assessment/Plan: Active Problems:   Coronary atherosclerosis   Cerebral artery occlusion with cerebral infarction   Disorder resulting from impaired renal function   Stroke   Right hemiparesis   Diabetes mellitus   Essential hypertension   HLD (hyperlipidemia)      1. Acute left MCA stroke Right hemiparesis History of prior CVA in 2008, on aspirin 81 mg at home Continue telemetry, 2-D echo for quality, normal LV function, no overt valvular abnormalities Carotid Doppler shows 1-39% right carotid stenosis, 40-59% stenosis of the left internal carotid artery Venous Doppler negative Currently on aspirin to 325 mg, was on Plavix before admission -Neurology following -PTOT and speech therapy evaluations pending History of SVT-TEE negative for PFO, patient also needs a loop recorder , seen by cardiology   2. 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    3. Uncontrolled diabetes/hyperglycemia  Hemoglobin A1c 7.7 Continue Lantus and sliding scale insulin   4. History of CAD -Continue aspirin, metoprolol, statin  5. Hypertriglyceridemia, triglycerides 197<321,  4 years ago, continue Pravachol  Code Status: full Family Communication: family updated about patient's clinical progress Disposition Plan:  PT, neurology to make final recommendations   Brief narrative: [name] [name] is a 74 y.o. male with past medical history of prior embolic CVA with residual right-sided weakness,, SVT, CK D3, type 2 diabetes, CAD, hypertension, dyslipidemia presents today for the above complaint. His wife noticed slurring of speech when she called him  today from work and right-sided weakness, worsened from his baseline right-sided weakness. His speech has slowly started improving he also had a CT angiogram done per neuro recommendations. CT head with no acute intracranial abnormalities. in addition also noted to have CBG in the 300s and crhe eatinine of 3, last creatinine in our system was 1.8 from 2011, reportedly he has seen a nephrologist with [facility] and is followed by his PCP for CKD  Consultants:  Neurology  Procedures:  None  Antibiotics: None  HPI/Subjective: Status post TEE, hungry and wants to eat, slurred speech and right upper extremity weakness is improving  Objective: Filed Vitals:   10/27/14 1000 10/27/14 1010 10/27/14 1020 10/27/14 1024  BP: 132/81 159/55 142/47   Pulse: 83 71 73   Temp:      TempSrc:      Resp: 22 19 18 18   Height:      Weight:      SpO2: 94% 94% 94%     Intake/Output Summary (Last 24 hours) at 10/27/14 1225 Last data filed at 10/27/14 0956  Gross per 24 hour  Intake    720 ml  Output   1025 ml  Net   -305 ml    Exam:  General: No acute respiratory distress Lungs: Clear to auscultation bilaterally without wheezes or crackles Cardiovascular: Regular rate and rhythm without murmur gallop or rub normal S1 and S2 Abdomen: Nontender, nondistended, soft, bowel sounds positive, no rebound, no ascites, no appreciable mass Extremities: No significant cyanosis, clubbing, or edema bilateral lower extremities Neurology Patient able to answer questions but is somewhat difficult to understand. Able to squeeze on left and wiggle both feet. Motor strength 3 on 518 right upper extremity.      Data Reviewed: Basic  Metabolic Panel:  Recent Labs Lab 10/25/14 2210 10/25/14 2219 10/26/14 0500 10/27/14 0642  NA 140 138 141 140  K 4.6 4.3 3.8 4.4  CL 106 107 108 108  CO2  --  27 24 21   GLUCOSE 316* 302* 107* 111*  BUN 43* 41* 36* 32*  CREATININE 3.00* 3.03* 2.74* 2.76*  CALCIUM   --  8.7 9.0 9.2    Liver Function Tests:  Recent Labs Lab 10/25/14 2219 10/27/14 0642  AST 19 27  ALT 19 23  ALKPHOS 46 36*  BILITOT 0.3 0.5  PROT 6.0 6.5  ALBUMIN 2.9* 3.1*   No results for input(s): LIPASE, AMYLASE in the last 168 hours. No results for input(s): AMMONIA in the last 168 hours.  CBC:  Recent Labs Lab 10/25/14 2210 10/25/14 2219 10/27/14 0642  WBC  --  10.0 9.6  NEUTROABS  --  5.2  --   HGB 12.9* 11.8* 12.0*  HCT 38.0* 36.1* 36.7*  MCV  --  88.0 88.0  PLT  --  263 257    Cardiac Enzymes: No results for input(s): CKTOTAL, CKMB, CKMBINDEX, TROPONINI in the last 168 hours. BNP (last 3 results) No results for input(s): BNP in the last 8760 hours.  ProBNP (last 3 results) No results for input(s): PROBNP in the last 8760 hours.    CBG:  Recent Labs Lab 10/26/14 1617 10/26/14 1959 10/27/14 0007 10/27/14 0405 10/27/14 1116  GLUCAP 185* 194* 168* 102* 130*    No results found for this or any previous visit (from the past 240 hour(s)).   Studies: Ct Angio Head W/cm &/or Wo Cm  10/25/2014   CLINICAL DATA:  RIGHT arm weakness, slurred speech, last seen normal at 1645 hours. History of hypertension, hyperlipidemia, diabetes, stroke.  EXAM: CT ANGIOGRAPHY HEAD AND NECK  TECHNIQUE: Multidetector CT imaging of the head and neck was performed using the standard protocol during bolus administration of intravenous contrast. Multiplanar CT image reconstructions and MIPs were obtained to evaluate the vascular anatomy. Carotid stenosis measurements (when applicable) are obtained utilizing NASCET criteria, using the distal internal carotid diameter as the denominator.  CONTRAST:  41mL OMNIPAQUE IOHEXOL 350 MG/ML SOLN  COMPARISON:  MRI of the brain November 19, 2006  FINDINGS: CT HEAD  Brain: Mild to moderately motion degraded examination, particularly degrades evaluation skull base.  No intraparenchymal hemorrhage, mass effect, midline shift or acute large vascular  territory infarct. LEFT frontoparietal encephalomalacia. Patchy to confluent supratentorial white matter hypodensities. Moderate to severe ventriculomegaly, likely on the basis of global parenchymal brain volume loss as there is overall commensurate enlargement of cerebral sulci and cerebellar folia. Remote RIGHT cystic basal ganglia and thalamus lacunar infarcts.  Basal cisterns are patent. Moderate calcific atherosclerosis of the carotid siphons.  Calvarium and skull base: No skull fracture though, assessment limited at the skull base. Patient is edentulous.  Paranasal sinuses: Probable maxillary sinusitis, limited by motion. Limited assessment of mastoid air cells.  Orbits:   Status post bilateral ocular lens implants.  CTA NECK  Normal appearance of the thoracic arch, normal branch pattern. Mild to moderate calcific atherosclerosis. The origins of the innominate, left Common carotid artery and subclavian artery are widely patent.  Bilateral Common carotid arteries are widely patent, coursing in a straight line fashion. Eccentric intimal thickening results in less than 40% stenosis of LEFT internal carotid artery. Mild calcific atherosclerosis of the RIGHT Common carotid artery. 2-3 mm eccentric intimal thickening and calcific atherosclerosis of the carotid bulbs extending to the  internal carotid artery origins. Normal appearance of the carotid bifurcations without hemodynamically significant stenosis by NASCET criteria. Approximately 40% stenosis of LEFT internal carotid artery origin. Normal appearance of the included internal carotid arteries.  Left vertebral artery is dominant. Atherosclerosis of the origin results in approximately 50% narrowing of RIGHT vertebral artery origin. The vertebral arteries  No dissection, no pseudoaneurysm. No abnormal luminal irregularity. No contrast extravasation.  9 mm LEFT thyroid nodule, below size surveillance recommendations. Fatty parotid glands. Patient is edentulous.  Bilateral maxillary sinus mucosal thickening with air-fluid levels. No acute osseous process though bone windows have not been submitted.  CTA HEAD  Anterior circulation: Normal appearance of the cervical internal carotid arteries, petrous, cavernous and supra clinoid internal carotid arteries. Widely patent anterior communicating artery. Normal appearance of the anterior and middle cerebral arteries. Dolichoectatic intracranial vessels.  Posterior circulation: The LEFT vertebral artery is dominant with normal appearance of the vertebral arteries, vertebrobasilar junction and basilar artery, as well as main branch vessels. RIGHT posterior inferior cerebellar artery origin infundibulum. Large, robust bilateral posterior communicating arteries contribute the predominant vascular supply to the posterior cerebral arteries which are widely patent. Dolichoectatic intracranial vessels.  No large vessel occlusion, hemodynamically significant stenosis, dissection, luminal irregularity, contrast extravasation or aneurysm within the anterior nor posterior circulation.  IMPRESSION: CT HEAD:  Mild to moderate motion degraded examination.  No acute intracranial process. LEFT frontoparietal encephalomalacia suggest remote LEFT middle cerebral artery territory infarct.  Moderate to severe parenchymal brain volume loss. Moderate to severe white matter changes suggest chronic small vessel ischemic disease. Chronic appearing RIGHT basal ganglia and RIGHT thalamus lacunar infarcts.  CTA NECK: Atherosclerosis of the carotid bulbs, with up to 40% stenosis of LEFT internal carotid artery by NASCET criteria.  Approximately 50% stenosis of vertebral artery origin.  Acute maxillary sinusitis.  CTA HEAD: Complete circle of Willis without large vessel occlusion or hemodynamically significant stenosis.  Dolichoectatic intracranial vessels suggests sequelae of chronic hypertension.  Acute findings discussed with and reconfirmed by Dr.PETER  SUMNER on 10/25/2014 at 10:59 pm.   Electronically Signed   By: Elon Alas   On: 10/25/2014 23:00   Ct Angio Neck W/cm &/or Wo/cm  10/25/2014   CLINICAL DATA:  RIGHT arm weakness, slurred speech, last seen normal at 1645 hours. History of hypertension, hyperlipidemia, diabetes, stroke.  EXAM: CT ANGIOGRAPHY HEAD AND NECK  TECHNIQUE: Multidetector CT imaging of the head and neck was performed using the standard protocol during bolus administration of intravenous contrast. Multiplanar CT image reconstructions and MIPs were obtained to evaluate the vascular anatomy. Carotid stenosis measurements (when applicable) are obtained utilizing NASCET criteria, using the distal internal carotid diameter as the denominator.  CONTRAST:  38mL OMNIPAQUE IOHEXOL 350 MG/ML SOLN  COMPARISON:  MRI of the brain November 19, 2006  FINDINGS: CT HEAD  Brain: Mild to moderately motion degraded examination, particularly degrades evaluation skull base.  No intraparenchymal hemorrhage, mass effect, midline shift or acute large vascular territory infarct. LEFT frontoparietal encephalomalacia. Patchy to confluent supratentorial white matter hypodensities. Moderate to severe ventriculomegaly, likely on the basis of global parenchymal brain volume loss as there is overall commensurate enlargement of cerebral sulci and cerebellar folia. Remote RIGHT cystic basal ganglia and thalamus lacunar infarcts.  Basal cisterns are patent. Moderate calcific atherosclerosis of the carotid siphons.  Calvarium and skull base: No skull fracture though, assessment limited at the skull base. Patient is edentulous.  Paranasal sinuses: Probable maxillary sinusitis, limited by motion. Limited assessment of mastoid air cells.  Orbits:   Status post bilateral ocular lens implants.  CTA NECK  Normal appearance of the thoracic arch, normal branch pattern. Mild to moderate calcific atherosclerosis. The origins of the innominate, left Common carotid artery and subclavian  artery are widely patent.  Bilateral Common carotid arteries are widely patent, coursing in a straight line fashion. Eccentric intimal thickening results in less than 40% stenosis of LEFT internal carotid artery. Mild calcific atherosclerosis of the RIGHT Common carotid artery. 2-3 mm eccentric intimal thickening and calcific atherosclerosis of the carotid bulbs extending to the internal carotid artery origins. Normal appearance of the carotid bifurcations without hemodynamically significant stenosis by NASCET criteria. Approximately 40% stenosis of LEFT internal carotid artery origin. Normal appearance of the included internal carotid arteries.  Left vertebral artery is dominant. Atherosclerosis of the origin results in approximately 50% narrowing of RIGHT vertebral artery origin. The vertebral arteries  No dissection, no pseudoaneurysm. No abnormal luminal irregularity. No contrast extravasation.  9 mm LEFT thyroid nodule, below size surveillance recommendations. Fatty parotid glands. Patient is edentulous. Bilateral maxillary sinus mucosal thickening with air-fluid levels. No acute osseous process though bone windows have not been submitted.  CTA HEAD  Anterior circulation: Normal appearance of the cervical internal carotid arteries, petrous, cavernous and supra clinoid internal carotid arteries. Widely patent anterior communicating artery. Normal appearance of the anterior and middle cerebral arteries. Dolichoectatic intracranial vessels.  Posterior circulation: The LEFT vertebral artery is dominant with normal appearance of the vertebral arteries, vertebrobasilar junction and basilar artery, as well as main branch vessels. RIGHT posterior inferior cerebellar artery origin infundibulum. Large, robust bilateral posterior communicating arteries contribute the predominant vascular supply to the posterior cerebral arteries which are widely patent. Dolichoectatic intracranial vessels.  No large vessel occlusion,  hemodynamically significant stenosis, dissection, luminal irregularity, contrast extravasation or aneurysm within the anterior nor posterior circulation.  IMPRESSION: CT HEAD:  Mild to moderate motion degraded examination.  No acute intracranial process. LEFT frontoparietal encephalomalacia suggest remote LEFT middle cerebral artery territory infarct.  Moderate to severe parenchymal brain volume loss. Moderate to severe white matter changes suggest chronic small vessel ischemic disease. Chronic appearing RIGHT basal ganglia and RIGHT thalamus lacunar infarcts.  CTA NECK: Atherosclerosis of the carotid bulbs, with up to 40% stenosis of LEFT internal carotid artery by NASCET criteria.  Approximately 50% stenosis of vertebral artery origin.  Acute maxillary sinusitis.  CTA HEAD: Complete circle of Willis without large vessel occlusion or hemodynamically significant stenosis.  Dolichoectatic intracranial vessels suggests sequelae of chronic hypertension.  Acute findings discussed with and reconfirmed by Dr.PETER SUMNER on 10/25/2014 at 10:59 pm.   Electronically Signed   By: Elon Alas   On: 10/25/2014 23:00   Mr Brain Wo Contrast  10/26/2014   CLINICAL DATA:  Right-sided weakness with slurred speech which began 10/25/2014. History of hypertension and diabetes. Initial encounter.  EXAM: MRI HEAD WITHOUT CONTRAST  TECHNIQUE: Multiplanar, multiecho pulse sequences of the brain and surrounding structures were obtained without intravenous contrast.  COMPARISON:  CT angio head neck 10/25/2014.  MRI brain 11/19/2006.  FINDINGS: Moderate-sized area of restricted diffusion affects the LEFT posterior frontal cortex and subcortical white matter representing acute infarction. No other areas of restricted diffusion are observed.  Generalized atrophy with prominence of the ventricles, cisterns, and sulci. Moderately extensive chronic microvascular ischemic change throughout the periventricular and subcortical white matter.  LEFT frontoparietal encephalomalacia is asymmetric, with the patulous LEFT sylvian fissure, likely remote chronic ischemic insults. Similar chronic lacunar insults are seen in  the RIGHT thalamus and RIGHT basal ganglia.  Flow voids are maintained. No foci of acute or chronic hemorrhage. Significant BILATERAL maxillary chronic paranasal sinus disease with both mucosal thickening as well as RIGHT maxillary air-fluid level suggesting acuity. No orbital findings. No mastoid fluid.  Good general agreement with prior CTA. Compared with the prior MR from 2008, there is significant progression of atrophy/encephalomalacia and small vessel disease. Multiple acute infarcts were also present at that time.  IMPRESSION: Acute LEFT MCA territory posterior frontal cortical and subcortical white matter infarction. No hemorrhage or mass effect.  Progression of generalized atrophy and small vessel disease with sequelae of multiple prior infarcts which were acute in 2008.  No large vessel occlusion is evident.  Acute and chronic maxillary sinus disease.   Electronically Signed   By: Rolla Flatten M.D.   On: 10/26/2014 08:48   US Renal  10/26/2014   CLINICAL DATA:  Chronic kidney disease  EXAM: RENAL/URINARY TRACT ULTRASOUND COMPLETE  COMPARISON:  None.  FINDINGS: Right Kidney:  Length: 10.6 cm.  No mass or hydronephrosis.  Left Kidney:  Length: 11.0 cm.  No mass or hydronephrosis.  Bladder:  Within normal limits.  IMPRESSION: Negative renal ultrasound.   Electronically Signed   By: Julian Hy M.D.   On: 10/26/2014 08:15    Scheduled Meds: . aspirin  300 mg Rectal Daily   Or  . aspirin  325 mg Oral Daily  . enoxaparin (LOVENOX) injection  30 mg Subcutaneous Q24H  . insulin aspart  0-15 Units Subcutaneous 6 times per day  . insulin glargine  20 Units Subcutaneous QHS  . metoprolol succinate  50 mg Oral Daily  . pravastatin  80 mg Oral Daily  . vitamin B-12  100 mcg Oral Daily   Continuous Infusions: . sodium  chloride 75 mL/hr at 10/26/14 2332    Active Problems:   Coronary atherosclerosis   Cerebral artery occlusion with cerebral infarction   Disorder resulting from impaired renal function   Stroke   Right hemiparesis   Diabetes mellitus   Essential hypertension   HLD (hyperlipidemia)    Time spent: 40 minutes   [facility] Pager [phone]. If 7PM-7AM, please contact night-coverage at [email or web address], password [facility] 10/27/2014, 12:25 PM  LOS: 2 days

## 2014-10-27 NOTE — Progress Notes (Signed)
STROKE TEAM PROGRESS NOTE   HISTORY [name] [name] is a 74 y.o. male hx of HTN, coronary artery disease, supraventricular tachycardia, previous MI, hypertension, hyperlipidemia, renal insufficiency, diabetes mellitus, and prior CVA presenting with acute onset right sided weakness and slurred speech. Per family, he was LSW at 55, shortly after his wife noted marked RUE weakness aphasia and slurred speech. At baseline he has right sided weakness from prior CVA but this is worse. BP noted to be 180/86 Glucose 398.   At initial evaluation noted to have symptoms of right sided weakness, aphasia and slurred speech. Based on presentation, concern for possible left MCA infarct, considered for potential IR intervention. CT imaging reviewed shows no signs of acute stroke or vessel occlusion. Expressive aphasia improved but weakness and dysarthria persist.  Date last known well: 10/25/2014 Time last known well: 1645 tPA Given: no, outside IV tPA window  SUBJECTIVE (INTERVAL HISTORY) Patient's wife and son are present. The patient just returned from TEE and loop placement. The patient's deficits are improving. Waiting for CIR placement.  OBJECTIVE Temp:  [97.3 F (36.3 C)-99 F (37.2 C)] 97.3 F (36.3 C) (03/04 1324) Pulse Rate:  [60-83] 74 (03/04 1324) Cardiac Rhythm:  [-] Heart block (03/03 2100) Resp:  [16-22] 20 (03/04 1324) BP: (103-199)/(47-99) 103/61 mmHg (03/04 1324) SpO2:  [93 %-99 %] 96 % (03/04 1324)   Recent Labs Lab 10/26/14 1617 10/26/14 1959 10/27/14 0007 10/27/14 0405 10/27/14 1116  GLUCAP 185* 194* 168* 102* 130*    Recent Labs Lab 10/25/14 2210 10/25/14 2219 10/26/14 0500 10/27/14 0642  NA 140 138 141 140  K 4.6 4.3 3.8 4.4  CL 106 107 108 108  CO2  --  27 24 21   GLUCOSE 316* 302* 107* 111*  BUN 43* 41* 36* 32*  CREATININE 3.00* 3.03* 2.74* 2.76*  CALCIUM  --  8.7 9.0 9.2    Recent Labs Lab 10/25/14 2219 10/27/14 0642  AST 19 27  ALT 19 23  ALKPHOS 46  36*  BILITOT 0.3 0.5  PROT 6.0 6.5  ALBUMIN 2.9* 3.1*    Recent Labs Lab 10/25/14 2210 10/25/14 2219 10/27/14 0642  WBC  --  10.0 9.6  NEUTROABS  --  5.2  --   HGB 12.9* 11.8* 12.0*  HCT 38.0* 36.1* 36.7*  MCV  --  88.0 88.0  PLT  --  263 257   No results for input(s): CKTOTAL, CKMB, CKMBINDEX, TROPONINI in the last 168 hours.  Recent Labs  10/25/14 2219  LABPROT 14.5  INR 1.12    Recent Labs  10/25/14 2241  COLORURINE YELLOW  LABSPEC 1.020  PHURINE 6.5  GLUCOSEU >1000*  HGBUR NEGATIVE  BILIRUBINUR NEGATIVE  KETONESUR NEGATIVE  PROTEINUR NEGATIVE  UROBILINOGEN 0.2  NITRITE NEGATIVE  LEUKOCYTESUR NEGATIVE       Component Value Date/Time   CHOL 132 10/26/2014 0500   TRIG 197* 10/26/2014 0500   HDL 28* 10/26/2014 0500   CHOLHDL 4.7 10/26/2014 0500   VLDL 39 10/26/2014 0500   LDLCALC 65 10/26/2014 0500   Lab Results  Component Value Date   HGBA1C 7.7* 10/26/2014      Component Value Date/Time   LABOPIA NONE DETECTED 10/25/2014 2241   COCAINSCRNUR NONE DETECTED 10/25/2014 2241   LABBENZ NONE DETECTED 10/25/2014 2241   AMPHETMU NONE DETECTED 10/25/2014 2241   THCU NONE DETECTED 10/25/2014 2241   LABBARB NONE DETECTED 10/25/2014 2241     Recent Labs Lab 10/25/14 2219  ETH <5    Ct  Angio Head W/cm &/or Wo Cm 10/25/2014     CT HEAD:   Mild to moderate motion degraded examination.  No acute intracranial process. LEFT frontoparietal encephalomalacia suggest remote LEFT middle cerebral artery territory infarct.  Moderate to severe parenchymal brain volume loss. Moderate to severe white matter changes suggest chronic small vessel ischemic disease. Chronic appearing RIGHT basal ganglia and RIGHT thalamus lacunar infarcts.    CTA NECK:  Atherosclerosis of the carotid bulbs, with up to 40% stenosis of LEFT internal carotid artery by NASCET criteria.  Approximately 50% stenosis of vertebral artery origin.  Acute maxillary sinusitis.    CTA HEAD:  Complete  circle of Willis without large vessel occlusion or hemodynamically significant stenosis.  Dolichoectatic intracranial vessels suggests sequelae of chronic hypertension.    MRI of the brain without contrast 10/26/2014 Acute LEFT MCA territory posterior frontal cortical and subcortical white matter infarction.  No hemorrhage or mass effect. Progression of generalized atrophy and small vessel disease with sequelae of multiple prior infarcts which were acute in 2008. No large vessel occlusion is evident. Acute and chronic maxillary sinus disease.  Renal ultrasound 10/26/2014 Negative renal ultrasound.  Bilateral lower extremity venous duplex Study was technically difficult due to poor patient cooperation and posterior acoustic shadowing from lower extremity arteries. Visualized veins of bilateral lower extremities which were able to be compressed are negative for deep vein thrombosis. Unable to perform compression maneuvers on some segments due to poor patient cooperation and guarding. There is no evidence of Baker's cyst bilaterally.  Vascular Ultrasound Carotid Duplex (Doppler) has been completed.  Findings suggest 1-39% right internal carotid artery stenosis and upper range 1-39% versus low range 40-59% stenosis of the left internal carotid artery. Vertebral arteries are patent with antegrade flow.  2D echo Limited, poor quality study. Probably normal overall LV function. Study is not adequate for wall motion analysis. No overt valvular abnormalities. Normal right ventricular size and function. No pericardial effusion. Impaired relaxation pattern of left ventricular filling. Normal mean left atrial pressure.  TEE  10/27/2014 Left Ventrical: Normal size and function, no WMA. Mitral Valve: Mild MR. Aortic Valve: Trace AI. Tricuspid Valve: Normal. Pulmonic Valve: Norma;. Left Atrium/ Left atrial appendage: No thrombus, normal emptying velicities. Atrial septum: No  PFO.  Aorta: Severe non-mobile plague.  PHYSICAL EXAM  Temp:  [97.3 F (36.3 C)-99 F (37.2 C)] 97.3 F (36.3 C) (03/04 1324) Pulse Rate:  [60-83] 74 (03/04 1324) Resp:  [16-22] 20 (03/04 1324) BP: (103-199)/(47-99) 103/61 mmHg (03/04 1324) SpO2:  [93 %-99 %] 96 % (03/04 1324)  General - Well nourished, well developed, in no apparent distress.  Ophthalmologic - Sharp disc margins OU.  Cardiovascular - Regular rate and rhythm with no murmur.  Mental Status -  Level of arousal and orientation to time, place, and person were intact. Language including expression, naming, repetition, comprehension was assessed and found intact.  Cranial Nerves II - XII - II - Visual field intact OU. III, IV, VI - Extraocular movements intact. V - Facial sensation intact bilaterally. VII - Facial movement showed mild right facial droop. VIII - Hearing & vestibular intact bilaterally. X - Palate elevates symmetrically. XI - Chin turning & shoulder shrug intact bilaterally. XII - Tongue protrusion intact.  Motor Strength - The patient's strength was 3/5 RUE proximal and 4/5 distal, 5-/5 RLE and right pronator drift was present. 5/5 LUE and LLE.  Bulk was normal and fasciculations were absent.   Motor Tone - Muscle tone was assessed at  the neck and appendages and was normal.  Reflexes - The patient's reflexes were normal in all extremities and he had no pathological reflexes.  Sensory - Light touch, temperature/pinprick were assessed and were normal.    Coordination - The patient had normal movements in the left hand and foot with no ataxia or dysmetria, right UE ataxia proportional to weakness.  Tremor was absent.  Gait and Station - not tested due to safety concerns.  ASSESSMENT/PLAN [name] is a 74 y.o. 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 He did not receive IV t-PA due to late presentation.   Stroke:  Dominant left MCA higher cortical infarct - possibly embolic. Pt also has left MCA, MCA/PCA and right cerebellar infarcts in 2008, consistent with cardio embolic stroke.   Resultant - right hemiparesis  MRI - Acute LEFT MCA territory posterior frontal cortical and subcortical white matter infarction.   CTA head and neck - 40% ICA stenosis on the left  Carotid Doppler - 1-39% right internal carotid artery stenosis and upper range 1-39% versus low range  40-59% stenosis of the left internal carotid artery. Vertebral arteries are patent with antegrade flow.  Bilateral lower extremity venous duplex - technically difficult study but negative for DVT.  2D Echo - limited study but probably normal EF  TEE - unremarkable except - Aorta: Severe non-mobile plague.   Loop recorder placed. Will continue to monitor. If afib found, will consider anticoagulation.  LDL - 65  HgbA1c 7.7  Lovenox  for VTE prophylaxis  Diet Carb Modified with thin liquids  aspirin 81 mg orally every day prior to admission, now on aspirin 325 mg orally every day. Will switch from ASA to plavix (ordered).  Ongoing aggressive stroke risk factor management  Therapy recommendations:  CIR consult recommended and ordered - pending  Disposition:  Pending  Hx of stroke in 2585  Embolic pattern, right cerebellar, left MCA, left MCA/PCA  Residue right UE weakness but able to do most of things  Was put on plavix and then switched to ASA. Currently on ASA 81 before admission  Switch to plavix for stroke prevention  Loop recorder placed and will continue monitor. If afib found, then anticoagulation.  Hypertension  Home meds:   Norvasc and Toprol-XL  Stable  Hyperlipidemia  Home meds:  Pravachol 80 mg daily -    LDL 65, goal < 70  Continue statin at discharge  Diabetes  HgbA1c 7.7, goal < 7.0  Uncontrolled  On lantus  SSI  DM education  Other Stroke Risk Factors  Advanced age  Cigarette smoker, quit smoking   Obesity, Body mass index is 30.44 kg/(m^2).   Hx stroke/TIA  Coronary artery disease  Other Active Problems  Renal insufficiency - somewhat improved today  Other Pertinent History  History of supraventricular tachycardia   Hospital day # 2  Mikey Bussing PA-C [facility] Pager [phone] 10/27/2014, 3:26 PM  I, the attending vascular neurologist, have personally obtained a history, examined the patient, evaluated laboratory data, individually viewed imaging studies and agree with radiology interpretations. I also obtained additional history from pt's wife at bedside. I also discussed with Dr. Allyson Sabal regarding his care plan. Together with the NP/PA, we formulated the assessment and plan of care which  reflects our mutual decision.  I have made any additions or clarifications directly to the above note and agree with the findings and plan as currently documented.   74 yo M with hx of stroke in 2008, CAD in 2001 and 2002, HTN was admitted for left MCA cortical stroke, pt also surffered from stroke in 2008 involving right cerebellar and left MCA and left MCA/PCA, embolic pattern. Was on plavix and now on ASA 81 prior to admission. TEE showed severe non-mobile plaque at aorta, looper recorder placed.  Switch from ASA to plavix and continue pravastation. Risk factor control. Waiting for CIR.  Neurology will sign off. Please call with questions. Pt will follow up with Dr. Erlinda Hong at [facility] in about 2 months. Thanks for the consult.  Rosalin Hawking, MD PhD Stroke Neurology 10/27/2014 3:26 PM   To contact Stroke Continuity provider, please refer to [email or web address] After hours, contact General Neurology

## 2014-10-27 NOTE — Progress Notes (Addendum)
Speech Language Pathology Treatment: Dysphagia  Patient Details Name: [name] [name] MRN: [id number] DOB: 1941/05/22 Today's Date: 10/27/2014 Time: 1452-1500 SLP Time Calculation (min) (ACUTE ONLY): 8 min  Assessment / Plan / Recommendation Clinical Impression  Brief observation with regular texture/thin liquids via straw. Pt denies difficulty since initiating po's yesterday. Oral and pharyngeal phase WFL's. No swallow follow up needed. Speech-language-cognitive assessment ordered. Yesterday pt reported that speech was at baseline; no overt cognitive concerns (wife reported pt does not read); screened to be at functional status. Pt is functional on the acute venue. Possible transfer to CIR where SLP may complete full assessment.   HPI HPI: Pt is a 74 y/o male with past medical history of prior embolic CVA with residual right-sided weakness,, SVT, CK D3, type 2 diabetes, CAD, hypertension, dyslipidemia presents today for the above complaint. His wife noticed slurring of speech when she called him today from work and right-sided weakness, worsened from his baseline right-sided weakness. His speech has slowly started improving he also had a CT angiogram done per neuro recommendations. CT head with no acute intracranial abnormalities. in addition also noted to have CBG in the 300s and creatinine of 3, last creatinine in our system was 1.8 from 2011, reportedly he has seen a nephrologist with [facility] and is followed by his PCP for CKD. MRI revealed acute LEFT MCA territory posterior frontal cortical and subcortical white matter infarction. No hemorrhage or mass effect. Progression of generalized atrophy and small vessel disease with sequelae of multiple prior infarcts which were acute in 2008.   Pertinent Vitals Pain Assessment: No/denies pain  SLP Plan  All goals met;Discharge SLP treatment due to (comment)    Recommendations Diet recommendations: Regular;Thin liquid Liquids provided via:  Cup;Straw Medication Administration: Whole meds with liquid Supervision: Patient able to self feed Compensations: Slow rate;Small sips/bites Postural Changes and/or Swallow Maneuvers: Out of bed for meals;Seated upright 90 degrees              Oral Care Recommendations: Oral care BID Follow up Recommendations: None Plan: All goals met;Discharge SLP treatment due to (comment)    GO     Houston Siren 10/27/2014, 3:08 PM   [name] [name].Ed [employer] [phone]

## 2014-10-27 NOTE — Consult Note (Signed)
Cardiologist:  [name] [name] Reason for Consult: Loop recorder implant Referring Physician: [name] is an 74 y.o. male.  HPI:   The patient is a 74 yo male with a history of SVT, CAD, HTN, HLD, embolic CVA, CKD III, DM. Last cardiac catheterization in April of 2009; at that time he was found to have a 40% mid LAD, 70% ostial OM and a 90% stenosis in the mid right coronary artery followed by a 70% stenosis in the distal right coronary artery.  Drug-eluting stents were placed to both lesions in the right coronary artery successfully.  Plavix was stopped in Nov 2012.    He presented with slurred speech and worsened right sided weakness.   He was found to have an acute left MCA territory posterior frontal cortical infarction.  2D echo revealed probably normal LVF due to poor quality of the study.  EP is asked to implant a loop recorder after he has a TEE.   The patient reports his breathing feels a little "Light".  The patient currently denies nausea, vomiting, fever, chest pain,  orthopnea, dizziness, PND, cough, congestion, abdominal pain, lower extremity edema.   Past Medical History  Diagnosis Date  . SUPRAVENTRICULAR TACHYCARDIA   . MYOCARDIAL INFARCTION   . HYPERTENSION   . HYPERLIPIDEMIA   . CEREBROVASCULAR DISEASE   . CEREBROVASCULAR ACCIDENT   . CAD   . RENAL INSUFFICIENCY   . DIABETES MELLITUS     Past Surgical History  Procedure Laterality Date  . Coronary stent placement      History reviewed. No pertinent family history.  Social History:  reports that he has quit smoking. He does not have any smokeless tobacco history on file. He reports that he does not drink alcohol. His drug history is not on file.  Allergies: No Known Allergies  Medications:  Scheduled Meds: . [MAR Hold] aspirin  300 mg Rectal Daily   Or  . [MAR Hold] aspirin  325 mg Oral Daily  . [MAR Hold] enoxaparin (LOVENOX) injection  30 mg Subcutaneous Q24H  . [MAR Hold] insulin aspart  0-15 Units  Subcutaneous 6 times per day  . insulin glargine  20 Units Subcutaneous Daily  . [MAR Hold] insulin glargine  20 Units Subcutaneous QHS  . [MAR Hold] metoprolol succinate  50 mg Oral Daily  . [MAR Hold] pravastatin  80 mg Oral Daily   Continuous Infusions: . sodium chloride 75 mL/hr at 10/26/14 2332  . sodium chloride     PRN Meds:.[MAR Hold] senna-docusate   Results for orders placed or performed during the hospital encounter of 10/25/14 (from the past 48 hour(s))  I-Stat Troponin, ED (not at [facility])     Status: None   Collection Time: 10/25/14 10:08 PM  Result Value Ref Range   Troponin i, poc 0.01 0.00 - 0.08 ng/mL   Comment 3            Comment: Due to the release kinetics of cTnI, a negative result within the first hours of the onset of symptoms does not rule out myocardial infarction with certainty. If myocardial infarction is still suspected, repeat the test at appropriate intervals.   I-Stat Chem 8, ED     Status: Abnormal   Collection Time: 10/25/14 10:10 PM  Result Value Ref Range   Sodium 140 135 - 145 mmol/L   Potassium 4.6 3.5 - 5.1 mmol/L   Chloride 106 96 - 112 mmol/L   BUN 43 (H) 6 - 23 mg/dL  Creatinine, Ser 3.00 (H) 0.50 - 1.35 mg/dL   Glucose, Bld 316 (H) 70 - 99 mg/dL   Calcium, Ion 1.19 1.13 - 1.30 mmol/L   TCO2 20 0 - 100 mmol/L   Hemoglobin 12.9 (L) 13.0 - 17.0 g/dL   HCT 38.0 (L) 39.0 - 52.0 %  Ethanol     Status: None   Collection Time: 10/25/14 10:19 PM  Result Value Ref Range   Alcohol, Ethyl (B) <5 0 - 9 mg/dL    Comment:        LOWEST DETECTABLE LIMIT FOR SERUM ALCOHOL IS 11 mg/dL FOR MEDICAL PURPOSES ONLY   Protime-INR     Status: None   Collection Time: 10/25/14 10:19 PM  Result Value Ref Range   Prothrombin Time 14.5 11.6 - 15.2 seconds   INR 1.12 0.00 - 1.49  APTT     Status: None   Collection Time: 10/25/14 10:19 PM  Result Value Ref Range   aPTT 29 24 - 37 seconds  CBC     Status: Abnormal   Collection Time: 10/25/14 10:19 PM    Result Value Ref Range   WBC 10.0 4.0 - 10.5 K/uL   RBC 4.10 (L) 4.22 - 5.81 MIL/uL   Hemoglobin 11.8 (L) 13.0 - 17.0 g/dL   HCT 36.1 (L) 39.0 - 52.0 %   MCV 88.0 78.0 - 100.0 fL   MCH 28.8 26.0 - 34.0 pg   MCHC 32.7 30.0 - 36.0 g/dL   RDW 14.0 11.5 - 15.5 %   Platelets 263 150 - 400 K/uL  Differential     Status: None   Collection Time: 10/25/14 10:19 PM  Result Value Ref Range   Neutrophils Relative % 52 43 - 77 %   Neutro Abs 5.2 1.7 - 7.7 K/uL   Lymphocytes Relative 38 12 - 46 %   Lymphs Abs 3.8 0.7 - 4.0 K/uL   Monocytes Relative 8 3 - 12 %   Monocytes Absolute 0.8 0.1 - 1.0 K/uL   Eosinophils Relative 2 0 - 5 %   Eosinophils Absolute 0.2 0.0 - 0.7 K/uL   Basophils Relative 0 0 - 1 %   Basophils Absolute 0.0 0.0 - 0.1 K/uL  Comprehensive metabolic panel     Status: Abnormal   Collection Time: 10/25/14 10:19 PM  Result Value Ref Range   Sodium 138 135 - 145 mmol/L   Potassium 4.3 3.5 - 5.1 mmol/L   Chloride 107 96 - 112 mmol/L   CO2 27 19 - 32 mmol/L   Glucose, Bld 302 (H) 70 - 99 mg/dL   BUN 41 (H) 6 - 23 mg/dL   Creatinine, Ser 3.03 (H) 0.50 - 1.35 mg/dL   Calcium 8.7 8.4 - 10.5 mg/dL   Total Protein 6.0 6.0 - 8.3 g/dL   Albumin 2.9 (L) 3.5 - 5.2 g/dL   AST 19 0 - 37 U/L   ALT 19 0 - 53 U/L   Alkaline Phosphatase 46 39 - 117 U/L   Total Bilirubin 0.3 0.3 - 1.2 mg/dL   GFR calc non Af Amer 19 (L) >90 mL/min   GFR calc Af Amer 22 (L) >90 mL/min    Comment: (NOTE) The eGFR has been calculated using the CKD EPI equation. This calculation has not been validated in all clinical situations. eGFR's persistently <90 mL/min signify possible Chronic Kidney Disease.    Anion gap 4 (L) 5 - 15  Urine Drug Screen     Status: None  Collection Time: 10/25/14 10:41 PM  Result Value Ref Range   Opiates NONE DETECTED NONE DETECTED   Cocaine NONE DETECTED NONE DETECTED   Benzodiazepines NONE DETECTED NONE DETECTED   Amphetamines NONE DETECTED NONE DETECTED    Tetrahydrocannabinol NONE DETECTED NONE DETECTED   Barbiturates NONE DETECTED NONE DETECTED    Comment:        DRUG SCREEN FOR MEDICAL PURPOSES ONLY.  IF CONFIRMATION IS NEEDED FOR ANY PURPOSE, NOTIFY LAB WITHIN 5 DAYS.        LOWEST DETECTABLE LIMITS FOR URINE DRUG SCREEN Drug Class       Cutoff (ng/mL) Amphetamine      1000 Barbiturate      200 Benzodiazepine   416 Tricyclics       384 Opiates          300 Cocaine          300 THC              50   Urinalysis, Routine w reflex microscopic     Status: Abnormal   Collection Time: 10/25/14 10:41 PM  Result Value Ref Range   Color, Urine YELLOW YELLOW   APPearance CLEAR CLEAR   Specific Gravity, Urine 1.020 1.005 - 1.030   pH 6.5 5.0 - 8.0   Glucose, UA >1000 (A) NEGATIVE mg/dL   Hgb urine dipstick NEGATIVE NEGATIVE   Bilirubin Urine NEGATIVE NEGATIVE   Ketones, ur NEGATIVE NEGATIVE mg/dL   Protein, ur NEGATIVE NEGATIVE mg/dL   Urobilinogen, UA 0.2 0.0 - 1.0 mg/dL   Nitrite NEGATIVE NEGATIVE   Leukocytes, UA NEGATIVE NEGATIVE  Urine microscopic-add on     Status: None   Collection Time: 10/25/14 10:41 PM  Result Value Ref Range   Squamous Epithelial / LPF RARE RARE   WBC, UA 0-2 <3 WBC/hpf   RBC / HPF 0-2 <3 RBC/hpf   Bacteria, UA RARE RARE  Glucose, capillary     Status: Abnormal   Collection Time: 10/26/14  4:09 AM  Result Value Ref Range   Glucose-Capillary 120 (H) 70 - 99 mg/dL  Hemoglobin A1c     Status: Abnormal   Collection Time: 10/26/14  5:00 AM  Result Value Ref Range   Hgb A1c MFr Bld 7.7 (H) 4.8 - 5.6 %    Comment: (NOTE)         Pre-diabetes: 5.7 - 6.4         Diabetes: >6.4         Glycemic control for adults with diabetes: <7.0    Mean Plasma Glucose 174 mg/dL    Comment: (NOTE) Performed At: [facility] [place], [place] [id number] Lindon Romp MD [id number]   Lipid panel     Status: Abnormal   Collection Time: 10/26/14  5:00 AM  Result Value Ref Range    Cholesterol 132 0 - 200 mg/dL   Triglycerides 197 (H) <150 mg/dL   HDL 28 (L) >39 mg/dL   Total CHOL/HDL Ratio 4.7 RATIO   VLDL 39 0 - 40 mg/dL   LDL Cholesterol 65 0 - 99 mg/dL    Comment:        Total Cholesterol/HDL:CHD Risk Coronary Heart Disease Risk Table                     Men   Women  1/2 Average Risk   3.4   3.3  Average Risk       5.0   4.4  2 X Average Risk   9.6   7.1  3 X Average Risk  23.4   11.0        Use the calculated Patient Ratio above and the CHD Risk Table to determine the patient's CHD Risk.        ATP III CLASSIFICATION (LDL):  <100     mg/dL   Optimal  100-129  mg/dL   Near or Above                    Optimal  130-159  mg/dL   Borderline  160-189  mg/dL   High  >190     mg/dL   Very High   Basic metabolic panel     Status: Abnormal   Collection Time: 10/26/14  5:00 AM  Result Value Ref Range   Sodium 141 135 - 145 mmol/L   Potassium 3.8 3.5 - 5.1 mmol/L   Chloride 108 96 - 112 mmol/L   CO2 24 19 - 32 mmol/L   Glucose, Bld 107 (H) 70 - 99 mg/dL   BUN 36 (H) 6 - 23 mg/dL   Creatinine, Ser 2.74 (H) 0.50 - 1.35 mg/dL   Calcium 9.0 8.4 - 10.5 mg/dL   GFR calc non Af Amer 21 (L) >90 mL/min   GFR calc Af Amer 24 (L) >90 mL/min    Comment: (NOTE) The eGFR has been calculated using the CKD EPI equation. This calculation has not been validated in all clinical situations. eGFR's persistently <90 mL/min signify possible Chronic Kidney Disease.    Anion gap 9 5 - 15  Glucose, capillary     Status: Abnormal   Collection Time: 10/26/14  8:45 AM  Result Value Ref Range   Glucose-Capillary 101 (H) 70 - 99 mg/dL   Comment 1 Notify RN    Comment 2 Documented in Char   Glucose, capillary     Status: Abnormal   Collection Time: 10/26/14 12:05 PM  Result Value Ref Range   Glucose-Capillary 159 (H) 70 - 99 mg/dL   Comment 1 Notify RN    Comment 2 Documented in Char   Glucose, capillary     Status: Abnormal   Collection Time: 10/26/14  4:17 PM  Result  Value Ref Range   Glucose-Capillary 185 (H) 70 - 99 mg/dL   Comment 1 Notify RN    Comment 2 Documented in Char   Glucose, capillary     Status: Abnormal   Collection Time: 10/26/14  7:59 PM  Result Value Ref Range   Glucose-Capillary 194 (H) 70 - 99 mg/dL   Comment 1 Notify RN    Comment 2 Documented in Char   Glucose, capillary     Status: Abnormal   Collection Time: 10/27/14 12:07 AM  Result Value Ref Range   Glucose-Capillary 168 (H) 70 - 99 mg/dL   Comment 1 Notify RN    Comment 2 Documented in Char   Glucose, capillary     Status: Abnormal   Collection Time: 10/27/14  4:05 AM  Result Value Ref Range   Glucose-Capillary 102 (H) 70 - 99 mg/dL   Comment 1 Notify RN    Comment 2 Documented in Char   CBC     Status: Abnormal   Collection Time: 10/27/14  6:42 AM  Result Value Ref Range   WBC 9.6 4.0 - 10.5 K/uL   RBC 4.17 (L) 4.22 - 5.81 MIL/uL   Hemoglobin 12.0 (L) 13.0 - 17.0 g/dL  HCT 36.7 (L) 39.0 - 52.0 %   MCV 88.0 78.0 - 100.0 fL   MCH 28.8 26.0 - 34.0 pg   MCHC 32.7 30.0 - 36.0 g/dL   RDW 14.1 11.5 - 15.5 %   Platelets 257 150 - 400 K/uL    Ct Angio Head W/cm &/or Wo Cm  10/25/2014   CLINICAL DATA:  RIGHT arm weakness, slurred speech, last seen normal at 1645 hours. History of hypertension, hyperlipidemia, diabetes, stroke.  EXAM: CT ANGIOGRAPHY HEAD AND NECK  TECHNIQUE: Multidetector CT imaging of the head and neck was performed using the standard protocol during bolus administration of intravenous contrast. Multiplanar CT image reconstructions and MIPs were obtained to evaluate the vascular anatomy. Carotid stenosis measurements (when applicable) are obtained utilizing NASCET criteria, using the distal internal carotid diameter as the denominator.  CONTRAST:  55m OMNIPAQUE IOHEXOL 350 MG/ML SOLN  COMPARISON:  MRI of the brain November 19, 2006  FINDINGS: CT HEAD  Brain: Mild to moderately motion degraded examination, particularly degrades evaluation skull base.  No  intraparenchymal hemorrhage, mass effect, midline shift or acute large vascular territory infarct. LEFT frontoparietal encephalomalacia. Patchy to confluent supratentorial white matter hypodensities. Moderate to severe ventriculomegaly, likely on the basis of global parenchymal brain volume loss as there is overall commensurate enlargement of cerebral sulci and cerebellar folia. Remote RIGHT cystic basal ganglia and thalamus lacunar infarcts.  Basal cisterns are patent. Moderate calcific atherosclerosis of the carotid siphons.  Calvarium and skull base: No skull fracture though, assessment limited at the skull base. Patient is edentulous.  Paranasal sinuses: Probable maxillary sinusitis, limited by motion. Limited assessment of mastoid air cells.  Orbits:   Status post bilateral ocular lens implants.  CTA NECK  Normal appearance of the thoracic arch, normal branch pattern. Mild to moderate calcific atherosclerosis. The origins of the innominate, left Common carotid artery and subclavian artery are widely patent.  Bilateral Common carotid arteries are widely patent, coursing in a straight line fashion. Eccentric intimal thickening results in less than 40% stenosis of LEFT internal carotid artery. Mild calcific atherosclerosis of the RIGHT Common carotid artery. 2-3 mm eccentric intimal thickening and calcific atherosclerosis of the carotid bulbs extending to the internal carotid artery origins. Normal appearance of the carotid bifurcations without hemodynamically significant stenosis by NASCET criteria. Approximately 40% stenosis of LEFT internal carotid artery origin. Normal appearance of the included internal carotid arteries.  Left vertebral artery is dominant. Atherosclerosis of the origin results in approximately 50% narrowing of RIGHT vertebral artery origin. The vertebral arteries  No dissection, no pseudoaneurysm. No abnormal luminal irregularity. No contrast extravasation.  9 mm LEFT thyroid nodule, below  size surveillance recommendations. Fatty parotid glands. Patient is edentulous. Bilateral maxillary sinus mucosal thickening with air-fluid levels. No acute osseous process though bone windows have not been submitted.  CTA HEAD  Anterior circulation: Normal appearance of the cervical internal carotid arteries, petrous, cavernous and supra clinoid internal carotid arteries. Widely patent anterior communicating artery. Normal appearance of the anterior and middle cerebral arteries. Dolichoectatic intracranial vessels.  Posterior circulation: The LEFT vertebral artery is dominant with normal appearance of the vertebral arteries, vertebrobasilar junction and basilar artery, as well as main branch vessels. RIGHT posterior inferior cerebellar artery origin infundibulum. Large, robust bilateral posterior communicating arteries contribute the predominant vascular supply to the posterior cerebral arteries which are widely patent. Dolichoectatic intracranial vessels.  No large vessel occlusion, hemodynamically significant stenosis, dissection, luminal irregularity, contrast extravasation or aneurysm within the anterior nor posterior circulation.  IMPRESSION: CT HEAD:  Mild to moderate motion degraded examination.  No acute intracranial process. LEFT frontoparietal encephalomalacia suggest remote LEFT middle cerebral artery territory infarct.  Moderate to severe parenchymal brain volume loss. Moderate to severe white matter changes suggest chronic small vessel ischemic disease. Chronic appearing RIGHT basal ganglia and RIGHT thalamus lacunar infarcts.  CTA NECK: Atherosclerosis of the carotid bulbs, with up to 40% stenosis of LEFT internal carotid artery by NASCET criteria.  Approximately 50% stenosis of vertebral artery origin.  Acute maxillary sinusitis.  CTA HEAD: Complete circle of Willis without large vessel occlusion or hemodynamically significant stenosis.  Dolichoectatic intracranial vessels suggests sequelae of  chronic hypertension.  Acute findings discussed with and reconfirmed by Dr.PETER SUMNER on 10/25/2014 at 10:59 pm.   Electronically Signed   By: Elon Alas   On: 10/25/2014 23:00   Ct Angio Neck W/cm &/or Wo/cm  10/25/2014   CLINICAL DATA:  RIGHT arm weakness, slurred speech, last seen normal at 1645 hours. History of hypertension, hyperlipidemia, diabetes, stroke.  EXAM: CT ANGIOGRAPHY HEAD AND NECK  TECHNIQUE: Multidetector CT imaging of the head and neck was performed using the standard protocol during bolus administration of intravenous contrast. Multiplanar CT image reconstructions and MIPs were obtained to evaluate the vascular anatomy. Carotid stenosis measurements (when applicable) are obtained utilizing NASCET criteria, using the distal internal carotid diameter as the denominator.  CONTRAST:  80m OMNIPAQUE IOHEXOL 350 MG/ML SOLN  COMPARISON:  MRI of the brain November 19, 2006  FINDINGS: CT HEAD  Brain: Mild to moderately motion degraded examination, particularly degrades evaluation skull base.  No intraparenchymal hemorrhage, mass effect, midline shift or acute large vascular territory infarct. LEFT frontoparietal encephalomalacia. Patchy to confluent supratentorial white matter hypodensities. Moderate to severe ventriculomegaly, likely on the basis of global parenchymal brain volume loss as there is overall commensurate enlargement of cerebral sulci and cerebellar folia. Remote RIGHT cystic basal ganglia and thalamus lacunar infarcts.  Basal cisterns are patent. Moderate calcific atherosclerosis of the carotid siphons.  Calvarium and skull base: No skull fracture though, assessment limited at the skull base. Patient is edentulous.  Paranasal sinuses: Probable maxillary sinusitis, limited by motion. Limited assessment of mastoid air cells.  Orbits:   Status post bilateral ocular lens implants.  CTA NECK  Normal appearance of the thoracic arch, normal branch pattern. Mild to moderate calcific  atherosclerosis. The origins of the innominate, left Common carotid artery and subclavian artery are widely patent.  Bilateral Common carotid arteries are widely patent, coursing in a straight line fashion. Eccentric intimal thickening results in less than 40% stenosis of LEFT internal carotid artery. Mild calcific atherosclerosis of the RIGHT Common carotid artery. 2-3 mm eccentric intimal thickening and calcific atherosclerosis of the carotid bulbs extending to the internal carotid artery origins. Normal appearance of the carotid bifurcations without hemodynamically significant stenosis by NASCET criteria. Approximately 40% stenosis of LEFT internal carotid artery origin. Normal appearance of the included internal carotid arteries.  Left vertebral artery is dominant. Atherosclerosis of the origin results in approximately 50% narrowing of RIGHT vertebral artery origin. The vertebral arteries  No dissection, no pseudoaneurysm. No abnormal luminal irregularity. No contrast extravasation.  9 mm LEFT thyroid nodule, below size surveillance recommendations. Fatty parotid glands. Patient is edentulous. Bilateral maxillary sinus mucosal thickening with air-fluid levels. No acute osseous process though bone windows have not been submitted.  CTA HEAD  Anterior circulation: Normal appearance of the cervical internal carotid arteries, petrous, cavernous and supra clinoid internal carotid arteries. Widely  patent anterior communicating artery. Normal appearance of the anterior and middle cerebral arteries. Dolichoectatic intracranial vessels.  Posterior circulation: The LEFT vertebral artery is dominant with normal appearance of the vertebral arteries, vertebrobasilar junction and basilar artery, as well as main branch vessels. RIGHT posterior inferior cerebellar artery origin infundibulum. Large, robust bilateral posterior communicating arteries contribute the predominant vascular supply to the posterior cerebral arteries  which are widely patent. Dolichoectatic intracranial vessels.  No large vessel occlusion, hemodynamically significant stenosis, dissection, luminal irregularity, contrast extravasation or aneurysm within the anterior nor posterior circulation.  IMPRESSION: CT HEAD:  Mild to moderate motion degraded examination.  No acute intracranial process. LEFT frontoparietal encephalomalacia suggest remote LEFT middle cerebral artery territory infarct.  Moderate to severe parenchymal brain volume loss. Moderate to severe white matter changes suggest chronic small vessel ischemic disease. Chronic appearing RIGHT basal ganglia and RIGHT thalamus lacunar infarcts.  CTA NECK: Atherosclerosis of the carotid bulbs, with up to 40% stenosis of LEFT internal carotid artery by NASCET criteria.  Approximately 50% stenosis of vertebral artery origin.  Acute maxillary sinusitis.  CTA HEAD: Complete circle of Willis without large vessel occlusion or hemodynamically significant stenosis.  Dolichoectatic intracranial vessels suggests sequelae of chronic hypertension.  Acute findings discussed with and reconfirmed by Dr.PETER SUMNER on 10/25/2014 at 10:59 pm.   Electronically Signed   By: Elon Alas   On: 10/25/2014 23:00   Mr Brain Wo Contrast  10/26/2014   CLINICAL DATA:  Right-sided weakness with slurred speech which began 10/25/2014. History of hypertension and diabetes. Initial encounter.  EXAM: MRI HEAD WITHOUT CONTRAST  TECHNIQUE: Multiplanar, multiecho pulse sequences of the brain and surrounding structures were obtained without intravenous contrast.  COMPARISON:  CT angio head neck 10/25/2014.  MRI brain 11/19/2006.  FINDINGS: Moderate-sized area of restricted diffusion affects the LEFT posterior frontal cortex and subcortical white matter representing acute infarction. No other areas of restricted diffusion are observed.  Generalized atrophy with prominence of the ventricles, cisterns, and sulci. Moderately extensive chronic  microvascular ischemic change throughout the periventricular and subcortical white matter. LEFT frontoparietal encephalomalacia is asymmetric, with the patulous LEFT sylvian fissure, likely remote chronic ischemic insults. Similar chronic lacunar insults are seen in the RIGHT thalamus and RIGHT basal ganglia.  Flow voids are maintained. No foci of acute or chronic hemorrhage. Significant BILATERAL maxillary chronic paranasal sinus disease with both mucosal thickening as well as RIGHT maxillary air-fluid level suggesting acuity. No orbital findings. No mastoid fluid.  Good general agreement with prior CTA. Compared with the prior MR from 2008, there is significant progression of atrophy/encephalomalacia and small vessel disease. Multiple acute infarcts were also present at that time.  IMPRESSION: Acute LEFT MCA territory posterior frontal cortical and subcortical white matter infarction. No hemorrhage or mass effect.  Progression of generalized atrophy and small vessel disease with sequelae of multiple prior infarcts which were acute in 2008.  No large vessel occlusion is evident.  Acute and chronic maxillary sinus disease.   Electronically Signed   By: Rolla Flatten M.D.   On: 10/26/2014 08:48   US Renal  10/26/2014   CLINICAL DATA:  Chronic kidney disease  EXAM: RENAL/URINARY TRACT ULTRASOUND COMPLETE  COMPARISON:  None.  FINDINGS: Right Kidney:  Length: 10.6 cm.  No mass or hydronephrosis.  Left Kidney:  Length: 11.0 cm.  No mass or hydronephrosis.  Bladder:  Within normal limits.  IMPRESSION: Negative renal ultrasound.   Electronically Signed   By: Julian Hy M.D.   On: 10/26/2014  08:15    Review of Systems  All other systems reviewed and are negative. The patient currently denies nausea, vomiting, fever, chest pain, orthopnea, dizziness, PND, cough, congestion, abdominal pain, hematochezia, melena, lower extremity edema, claudication.  Blood pressure 167/70, pulse 65, temperature 99 F (37.2 C),  temperature source Oral, resp. rate 20, height '5\' 6"'  (1.676 m), weight 188 lb 7.9 oz (85.5 kg), SpO2 96 %. Physical Exam  Nursing note and vitals reviewed. Well nourished, well developed, in no acute distress.  The patient is laying flat. HEENT: Pupils are equal round react to light accommodation extraocular movements are intact.  Neck: no JVDNo cervical lymphadenopathy. Cardiac: Regular rate and rhythm with 1/6 sys MM Lungs:  clear to auscultation bilaterally, no wheezing, rhonchi or rales Abd: soft, nontender, positive bowel sounds all quadrants Ext: no lower extremity edema.  2+ radial and dorsalis pedis pulses. Skin: warm and dry Neuro:  Alert and oriented  Assessment/Plan: Active Problems:   Coronary atherosclerosis   Cerebral artery occlusion with cerebral infarction   Disorder resulting from impaired renal function   Stroke   Right hemiparesis   Diabetes mellitus   Essential hypertension   HLD (hyperlipidemia)  Plan:  The patient will have a loop recorder implanted after his TEE which is scheduled for 0900hrs today.  The procedure was discussed.  Review of telemetry revealed no afib or flutter.  He has been in SR with first deg AVB since admission.  BP poorly controlled and high this morning.  Recommend restarting amlodipine.    HAGER, BRYAN, PA-C 10/27/2014, 7:46 AM   Recommend ILR implant for cryptogenic stroke BP per primary team

## 2014-10-27 NOTE — Evaluation (Signed)
Physical Therapy Evaluation Patient Details Name: [name] [name] MRN: [id number] DOB: 11/08/1940 Today's Date: 10/27/2014   History of Present Illness  Patient is a 74 y/o male admitted with right sided weakness and slurred speech. PMH of prior embolic CVA with residual right-sided weakness,, SVT, CK D3, type 2 diabetes, CAD, hypertension and dyslipidemia. MRI brain-Acute LEFT MCA territory posterior frontal cortical and subcortical white matter infarction. S/p TEE 3/4.     Clinical Impression  Patient presents with right hemiparesis and impaired trunk control impacting balance and mobility. Requires Min-Mod A for transfers and ambulation due to weakness through RUE/LE. Pt independent PTA and motivated to return to functional baseline. Highly motivated. Pt would benefit from CIR to improve transfers, gait, balance and mobility so pt can maximize independence and return to PLOF.    Follow Up Recommendations CIR    Equipment Recommendations  Other (comment) (defer to next venue)    Recommendations for Other Services OT consult     Precautions / Restrictions Precautions Precautions: Fall Restrictions Weight Bearing Restrictions: No      Mobility  Bed Mobility Overal bed mobility: Needs Assistance Bed Mobility: Rolling;Sidelying to Sit Rolling: Supervision Sidelying to sit: Mod assist;HOB elevated       General bed mobility comments: Mod A to elevate trunk, use of rails.   Transfers Overall transfer level: Needs assistance Equipment used: None Transfers: Sit to/from Stand Sit to Stand: Min assist         General transfer comment: Min A to rise from EOB with cues for anterior translation. Right lateral trunk lean initially upon standing.   Ambulation/Gait Ambulation/Gait assistance: Mod assist Ambulation Distance (Feet): 6 Feet Assistive device: None Gait Pattern/deviations: Step-to pattern;Decreased stride length;Decreased stance time - right;Decreased step length -  left;Narrow base of support   Gait velocity interpretation: Below normal speed for age/gender General Gait Details: Pt with knee instability RLE during stance phase (partial knee buckling) and right lateral trunk lean. Short shuffling steps. Mod A to assist with advancing RLE and postural stability/control.  Stairs            Wheelchair Mobility    Modified Rankin (Stroke Patients Only) Modified Rankin (Stroke Patients Only) Pre-Morbid Rankin Score: No significant disability Modified Rankin: Moderately severe disability     Balance Overall balance assessment: Needs assistance Sitting-balance support: Feet supported;No upper extremity supported Sitting balance-Leahy Scale: Fair Sitting balance - Comments: Pt with posterior trunk lean during AROM BLEs requiring Min A to stay upright. Poor trunk control. Postural control: Posterior lean Standing balance support: During functional activity Standing balance-Leahy Scale: Fair Standing balance comment: Requires Min A-Min guard assist for static standing and Min-Mod A for dynamic standing/ambulation due to right hemiparesis.                             Pertinent Vitals/Pain Pain Assessment: No/denies pain    Home Living Family/patient expects to be discharged to:: Private residence Living Arrangements: Spouse/significant other Available Help at Discharge: Family;Available PRN/intermittently (Wife works) Type of Home: House Home Access: Stairs to enter Entrance Stairs-Rails: Building surveyor of Steps: 4 Home Layout: One level Home Equipment: None      Prior Function Level of Independence: Independent               Hand Dominance   Dominant Hand: Right    Extremity/Trunk Assessment   Upper Extremity Assessment: Defer to OT evaluation;RUE deficits/detail RUE Deficits / Details: Grossly decreased  AROM and strength distally>proximally. No digit/wrist AROM or muscle activation.    RUE  Sensation: decreased light touch     Lower Extremity Assessment: RLE deficits/detail;Generalized weakness RLE Deficits / Details: Grossly ~2+/5 hip flexion, ~3/5 knee extension/flexion and 3/5 DF.       Communication   Communication: Other (comment) (dysarthria)  Cognition Arousal/Alertness: Awake/alert Behavior During Therapy: WFL for tasks assessed/performed Overall Cognitive Status: Within Functional Limits for tasks assessed                      General Comments General comments (skin integrity, edema, etc.): Discussed disposition with patient- CIR vs SNF.     Exercises        Assessment/Plan    PT Assessment Patient needs continued PT services  PT Diagnosis Abnormality of gait;Difficulty walking;Hemiplegia dominant side   PT Problem List Decreased strength;Decreased range of motion;Impaired sensation;Decreased activity tolerance;Decreased balance;Decreased mobility;Impaired tone  PT Treatment Interventions Balance training;Gait training;Neuromuscular re-education;Functional mobility training;Therapeutic activities;Therapeutic exercise;Patient/family education;Stair training;DME instruction   PT Goals (Current goals can be found in the Care Plan section) Acute Rehab PT Goals Patient Stated Goal: to be able to be independent again and walk PT Goal Formulation: With patient Time For Goal Achievement: 11/10/14 Potential to Achieve Goals: Fair    Frequency Min 4X/week   Barriers to discharge Decreased caregiver support pt's wife works during the day so pt is home alone.    Co-evaluation               End of Session Equipment Utilized During Treatment: Gait belt Activity Tolerance: Patient tolerated treatment well Patient left: in chair;with chair alarm set;with call bell/phone within reach Nurse Communication: Mobility status         Time: 1325-1345 PT Time Calculation (min) (ACUTE ONLY): 20 min   Charges:   PT Evaluation $Initial PT  Evaluation Tier I: 1 Procedure     PT G CodesCandy Sledge A 11/22/2014, 2:05 PM Candy Sledge, [place], [place]

## 2014-10-27 NOTE — Interval H&P Note (Signed)
History and Physical Interval Note:  10/27/2014 9:18 AM  [name] [name]  has presented today for surgery, with the diagnosis of STROKE  The various methods of treatment have been discussed with the patient and family. After consideration of risks, benefits and other options for treatment, the patient has consented to  Procedure(s): TRANSESOPHAGEAL ECHOCARDIOGRAM (TEE) (N/A) as a surgical intervention .  The patient's history has been reviewed, patient examined, no change in status, stable for surgery.  I have reviewed the patient's chart and labs.  Questions were answered to the patient's satisfaction.     Dorothy Spark

## 2014-10-27 NOTE — Progress Notes (Signed)
CARE MANAGEMENT NOTE 10/27/2014  Patient:  [name] [name]   Account Number:  [id number]  Date Initiated:  10/27/2014  Documentation initiated by:  Olga Coaster  Subjective/Objective Assessment:   ADMITTED WITH STROKE     Action/Plan:   CM FOLLOWING FOR DCP   Anticipated DC Date:  10/28/2014   Anticipated DC Plan:  AWAITING FOR PT/OT EVALS FOR DISPOSITION NEEDS     DC Planning Services  CM consult          Status of service:  In process, will continue to follow  Per UR Regulation:  Reviewed for med. necessity/level of care/duration of stay  Comments:  3/4/2016Mindi Slicker RN,BSN,MHA [phone]

## 2014-10-28 LAB — GLUCOSE, CAPILLARY
GLUCOSE-CAPILLARY: 184 mg/dL — AB (ref 70–99)
Glucose-Capillary: 185 mg/dL — ABNORMAL HIGH (ref 70–99)
Glucose-Capillary: 202 mg/dL — ABNORMAL HIGH (ref 70–99)
Glucose-Capillary: 222 mg/dL — ABNORMAL HIGH (ref 70–99)
Glucose-Capillary: 93 mg/dL (ref 70–99)
Glucose-Capillary: 94 mg/dL (ref 70–99)

## 2014-10-28 NOTE — Progress Notes (Addendum)
[facility] PROGRESS NOTE  [name] [name] OQH:[id number] DOB: 12-08-1940 DOA: 10/25/2014 PCP: Stephens Shire, MD  Assessment/Plan: Active Problems:   Coronary atherosclerosis   Cerebral artery occlusion with cerebral infarction   Disorder resulting from impaired renal function   Stroke   Right hemiparesis   Diabetes mellitus   Essential hypertension   HLD (hyperlipidemia)      1. Acute left MCA stroke Right hemiparesis History of prior CVA in 2008, on aspirin 81 mg at home Continue telemetry, 2-D echo for quality, normal LV function, no overt valvular abnormalities Carotid Doppler shows 1-39% right carotid stenosis, 40-59% stenosis of the left internal carotid artery Venous Doppler negative Patient has been switched to Plavix by neurology -Neurology following -PTOT and speech therapy-elevating CIR, speech therapy recommends regular diet with thin liquids History of SVT-TEE negative for PFO, patient also needs a loop recorder , seen by cardiology CIR consult pending  2. 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 Uncontrolled diabetes/hyperglycemia  Hemoglobin A1c 7.7 Continue Lantus and sliding scale insulin   4. History of CAD Last cardiac catheterization in April of 2009; at that time he was found to have a 40% mid LAD, 70% ostial OM and a 90% stenosis in the mid right coronary artery followed by a 70% stenosis in the distal right coronary artery. Drug-eluting stents were placed to both lesions in the right coronary artery successfully. Plavix was stopped in Nov 2012 -Continue metoprolol, statin, now on Plavix  5. Hypertriglyceridemia, triglycerides 197<321,  4 years ago, continue Pravachol  Code Status: full Family Communication:  family updated about patient's clinical progress Disposition Plan:  PT, neurology to make final recommendations   Brief narrative: [name] [name] is a 74 y.o. male with past medical history of prior embolic CVA with residual right-sided weakness,, SVT, CK D3, type 2 diabetes, CAD, hypertension, dyslipidemia presents today for the above complaint. His wife noticed slurring of speech when she called him today from work and right-sided weakness, worsened from his baseline right-sided weakness. His speech has slowly started improving he also had a CT angiogram done per neuro recommendations. CT head with no acute intracranial abnormalities. in addition also noted to have CBG in the 300s and crhe eatinine of 3, last creatinine in our system was 1.8 from 2011, reportedly he has seen a nephrologist with [facility] and is followed by his PCP for CKD  Consultants:  Neurology  Procedures:  None  Antibiotics: None  HPI/Subjective: No complaints, patient resting comfortably, strength is improving  Objective: Filed Vitals:   10/27/14 2110 10/28/14 0118 10/28/14 0526 10/28/14 0935  BP: 190/69 145/65 155/77 132/59  Pulse: 77 70 72 72  Temp: 98.4 F (36.9 C) 98.8 F (37.1 C) 97.7 F (36.5 C) 98.1 F (36.7 C)  TempSrc: Oral Oral Oral Oral  Resp: 18 18 18 20   Height:      Weight:      SpO2: 96% 96% 95% 96%    Intake/Output Summary (Last 24 hours) at 10/28/14 1257 Last data filed at 10/28/14 1246  Gross per 24 hour  Intake      0 ml  Output    850 ml  Net   -850 ml    Exam:  General: No acute respiratory distress Lungs: Clear to auscultation bilaterally without wheezes  or crackles Cardiovascular: Regular rate and rhythm without murmur gallop or rub normal S1 and S2 Abdomen: Nontender, nondistended, soft, bowel sounds positive, no rebound, no ascites, no appreciable mass Extremities: No significant cyanosis, clubbing, or edema bilateral lower extremities Neurology Motor  Strength - The patient's strength was 3/5 RUE proximal and 4/5 distal, 5-/5 RLE and right pronator drift was present      Data Reviewed: Basic Metabolic Panel:  Recent Labs Lab 10/25/14 2210 10/25/14 2219 10/26/14 0500 10/27/14 0642  NA 140 138 141 140  K 4.6 4.3 3.8 4.4  CL 106 107 108 108  CO2  --  27 24 21   GLUCOSE 316* 302* 107* 111*  BUN 43* 41* 36* 32*  CREATININE 3.00* 3.03* 2.74* 2.76*  CALCIUM  --  8.7 9.0 9.2    Liver Function Tests:  Recent Labs Lab 10/25/14 2219 10/27/14 0642  AST 19 27  ALT 19 23  ALKPHOS 46 36*  BILITOT 0.3 0.5  PROT 6.0 6.5  ALBUMIN 2.9* 3.1*   No results for input(s): LIPASE, AMYLASE in the last 168 hours. No results for input(s): AMMONIA in the last 168 hours.  CBC:  Recent Labs Lab 10/25/14 2210 10/25/14 2219 10/27/14 0642  WBC  --  10.0 9.6  NEUTROABS  --  5.2  --   HGB 12.9* 11.8* 12.0*  HCT 38.0* 36.1* 36.7*  MCV  --  88.0 88.0  PLT  --  263 257    Cardiac Enzymes: No results for input(s): CKTOTAL, CKMB, CKMBINDEX, TROPONINI in the last 168 hours. BNP (last 3 results) No results for input(s): BNP in the last 8760 hours.  ProBNP (last 3 results) No results for input(s): PROBNP in the last 8760 hours.    CBG:  Recent Labs Lab 10/27/14 1954 10/27/14 2356 10/28/14 0437 10/28/14 0803 10/28/14 1223  GLUCAP 141* 117* 94 93 185*    No results found for this or any previous visit (from the past 240 hour(s)).   Studies: Ct Angio Head W/cm &/or Wo Cm  10/25/2014   CLINICAL DATA:  RIGHT arm weakness, slurred speech, last seen normal at 1645 hours. History of hypertension, hyperlipidemia, diabetes, stroke.  EXAM: CT ANGIOGRAPHY HEAD AND NECK  TECHNIQUE: Multidetector CT imaging of the head and neck was performed using the standard protocol during bolus administration of intravenous contrast. Multiplanar CT image reconstructions and MIPs were obtained to evaluate the vascular anatomy. Carotid stenosis measurements  (when applicable) are obtained utilizing NASCET criteria, using the distal internal carotid diameter as the denominator.  CONTRAST:  25mL OMNIPAQUE IOHEXOL 350 MG/ML SOLN  COMPARISON:  MRI of the brain November 19, 2006  FINDINGS: CT HEAD  Brain: Mild to moderately motion degraded examination, particularly degrades evaluation skull base.  No intraparenchymal hemorrhage, mass effect, midline shift or acute large vascular territory infarct. LEFT frontoparietal encephalomalacia. Patchy to confluent supratentorial white matter hypodensities. Moderate to severe ventriculomegaly, likely on the basis of global parenchymal brain volume loss as there is overall commensurate enlargement of cerebral sulci and cerebellar folia. Remote RIGHT cystic basal ganglia and thalamus lacunar infarcts.  Basal cisterns are patent. Moderate calcific atherosclerosis of the carotid siphons.  Calvarium and skull base: No skull fracture though, assessment limited at the skull base. Patient is edentulous.  Paranasal sinuses: Probable maxillary sinusitis, limited by motion. Limited assessment of mastoid air cells.  Orbits:   Status post bilateral ocular lens implants.  CTA NECK  Normal appearance of the thoracic arch, normal branch pattern. Mild to  moderate calcific atherosclerosis. The origins of the innominate, left Common carotid artery and subclavian artery are widely patent.  Bilateral Common carotid arteries are widely patent, coursing in a straight line fashion. Eccentric intimal thickening results in less than 40% stenosis of LEFT internal carotid artery. Mild calcific atherosclerosis of the RIGHT Common carotid artery. 2-3 mm eccentric intimal thickening and calcific atherosclerosis of the carotid bulbs extending to the internal carotid artery origins. Normal appearance of the carotid bifurcations without hemodynamically significant stenosis by NASCET criteria. Approximately 40% stenosis of LEFT internal carotid artery origin. Normal  appearance of the included internal carotid arteries.  Left vertebral artery is dominant. Atherosclerosis of the origin results in approximately 50% narrowing of RIGHT vertebral artery origin. The vertebral arteries  No dissection, no pseudoaneurysm. No abnormal luminal irregularity. No contrast extravasation.  9 mm LEFT thyroid nodule, below size surveillance recommendations. Fatty parotid glands. Patient is edentulous. Bilateral maxillary sinus mucosal thickening with air-fluid levels. No acute osseous process though bone windows have not been submitted.  CTA HEAD  Anterior circulation: Normal appearance of the cervical internal carotid arteries, petrous, cavernous and supra clinoid internal carotid arteries. Widely patent anterior communicating artery. Normal appearance of the anterior and middle cerebral arteries. Dolichoectatic intracranial vessels.  Posterior circulation: The LEFT vertebral artery is dominant with normal appearance of the vertebral arteries, vertebrobasilar junction and basilar artery, as well as main branch vessels. RIGHT posterior inferior cerebellar artery origin infundibulum. Large, robust bilateral posterior communicating arteries contribute the predominant vascular supply to the posterior cerebral arteries which are widely patent. Dolichoectatic intracranial vessels.  No large vessel occlusion, hemodynamically significant stenosis, dissection, luminal irregularity, contrast extravasation or aneurysm within the anterior nor posterior circulation.  IMPRESSION: CT HEAD:  Mild to moderate motion degraded examination.  No acute intracranial process. LEFT frontoparietal encephalomalacia suggest remote LEFT middle cerebral artery territory infarct.  Moderate to severe parenchymal brain volume loss. Moderate to severe white matter changes suggest chronic small vessel ischemic disease. Chronic appearing RIGHT basal ganglia and RIGHT thalamus lacunar infarcts.  CTA NECK: Atherosclerosis of the  carotid bulbs, with up to 40% stenosis of LEFT internal carotid artery by NASCET criteria.  Approximately 50% stenosis of vertebral artery origin.  Acute maxillary sinusitis.  CTA HEAD: Complete circle of Willis without large vessel occlusion or hemodynamically significant stenosis.  Dolichoectatic intracranial vessels suggests sequelae of chronic hypertension.  Acute findings discussed with and reconfirmed by Dr.PETER SUMNER on 10/25/2014 at 10:59 pm.   Electronically Signed   By: Elon Alas   On: 10/25/2014 23:00   Ct Angio Neck W/cm &/or Wo/cm  10/25/2014   CLINICAL DATA:  RIGHT arm weakness, slurred speech, last seen normal at 1645 hours. History of hypertension, hyperlipidemia, diabetes, stroke.  EXAM: CT ANGIOGRAPHY HEAD AND NECK  TECHNIQUE: Multidetector CT imaging of the head and neck was performed using the standard protocol during bolus administration of intravenous contrast. Multiplanar CT image reconstructions and MIPs were obtained to evaluate the vascular anatomy. Carotid stenosis measurements (when applicable) are obtained utilizing NASCET criteria, using the distal internal carotid diameter as the denominator.  CONTRAST:  16mL OMNIPAQUE IOHEXOL 350 MG/ML SOLN  COMPARISON:  MRI of the brain November 19, 2006  FINDINGS: CT HEAD  Brain: Mild to moderately motion degraded examination, particularly degrades evaluation skull base.  No intraparenchymal hemorrhage, mass effect, midline shift or acute large vascular territory infarct. LEFT frontoparietal encephalomalacia. Patchy to confluent supratentorial white matter hypodensities. Moderate to severe ventriculomegaly, likely on the basis of global  parenchymal brain volume loss as there is overall commensurate enlargement of cerebral sulci and cerebellar folia. Remote RIGHT cystic basal ganglia and thalamus lacunar infarcts.  Basal cisterns are patent. Moderate calcific atherosclerosis of the carotid siphons.  Calvarium and skull base: No skull fracture  though, assessment limited at the skull base. Patient is edentulous.  Paranasal sinuses: Probable maxillary sinusitis, limited by motion. Limited assessment of mastoid air cells.  Orbits:   Status post bilateral ocular lens implants.  CTA NECK  Normal appearance of the thoracic arch, normal branch pattern. Mild to moderate calcific atherosclerosis. The origins of the innominate, left Common carotid artery and subclavian artery are widely patent.  Bilateral Common carotid arteries are widely patent, coursing in a straight line fashion. Eccentric intimal thickening results in less than 40% stenosis of LEFT internal carotid artery. Mild calcific atherosclerosis of the RIGHT Common carotid artery. 2-3 mm eccentric intimal thickening and calcific atherosclerosis of the carotid bulbs extending to the internal carotid artery origins. Normal appearance of the carotid bifurcations without hemodynamically significant stenosis by NASCET criteria. Approximately 40% stenosis of LEFT internal carotid artery origin. Normal appearance of the included internal carotid arteries.  Left vertebral artery is dominant. Atherosclerosis of the origin results in approximately 50% narrowing of RIGHT vertebral artery origin. The vertebral arteries  No dissection, no pseudoaneurysm. No abnormal luminal irregularity. No contrast extravasation.  9 mm LEFT thyroid nodule, below size surveillance recommendations. Fatty parotid glands. Patient is edentulous. Bilateral maxillary sinus mucosal thickening with air-fluid levels. No acute osseous process though bone windows have not been submitted.  CTA HEAD  Anterior circulation: Normal appearance of the cervical internal carotid arteries, petrous, cavernous and supra clinoid internal carotid arteries. Widely patent anterior communicating artery. Normal appearance of the anterior and middle cerebral arteries. Dolichoectatic intracranial vessels.  Posterior circulation: The LEFT vertebral artery is  dominant with normal appearance of the vertebral arteries, vertebrobasilar junction and basilar artery, as well as main branch vessels. RIGHT posterior inferior cerebellar artery origin infundibulum. Large, robust bilateral posterior communicating arteries contribute the predominant vascular supply to the posterior cerebral arteries which are widely patent. Dolichoectatic intracranial vessels.  No large vessel occlusion, hemodynamically significant stenosis, dissection, luminal irregularity, contrast extravasation or aneurysm within the anterior nor posterior circulation.  IMPRESSION: CT HEAD:  Mild to moderate motion degraded examination.  No acute intracranial process. LEFT frontoparietal encephalomalacia suggest remote LEFT middle cerebral artery territory infarct.  Moderate to severe parenchymal brain volume loss. Moderate to severe white matter changes suggest chronic small vessel ischemic disease. Chronic appearing RIGHT basal ganglia and RIGHT thalamus lacunar infarcts.  CTA NECK: Atherosclerosis of the carotid bulbs, with up to 40% stenosis of LEFT internal carotid artery by NASCET criteria.  Approximately 50% stenosis of vertebral artery origin.  Acute maxillary sinusitis.  CTA HEAD: Complete circle of Willis without large vessel occlusion or hemodynamically significant stenosis.  Dolichoectatic intracranial vessels suggests sequelae of chronic hypertension.  Acute findings discussed with and reconfirmed by Dr.PETER SUMNER on 10/25/2014 at 10:59 pm.   Electronically Signed   By: Elon Alas   On: 10/25/2014 23:00   Mr Brain Wo Contrast  10/26/2014   CLINICAL DATA:  Right-sided weakness with slurred speech which began 10/25/2014. History of hypertension and diabetes. Initial encounter.  EXAM: MRI HEAD WITHOUT CONTRAST  TECHNIQUE: Multiplanar, multiecho pulse sequences of the brain and surrounding structures were obtained without intravenous contrast.  COMPARISON:  CT angio head neck 10/25/2014.  MRI  brain 11/19/2006.  FINDINGS: Moderate-sized area  of restricted diffusion affects the LEFT posterior frontal cortex and subcortical white matter representing acute infarction. No other areas of restricted diffusion are observed.  Generalized atrophy with prominence of the ventricles, cisterns, and sulci. Moderately extensive chronic microvascular ischemic change throughout the periventricular and subcortical white matter. LEFT frontoparietal encephalomalacia is asymmetric, with the patulous LEFT sylvian fissure, likely remote chronic ischemic insults. Similar chronic lacunar insults are seen in the RIGHT thalamus and RIGHT basal ganglia.  Flow voids are maintained. No foci of acute or chronic hemorrhage. Significant BILATERAL maxillary chronic paranasal sinus disease with both mucosal thickening as well as RIGHT maxillary air-fluid level suggesting acuity. No orbital findings. No mastoid fluid.  Good general agreement with prior CTA. Compared with the prior MR from 2008, there is significant progression of atrophy/encephalomalacia and small vessel disease. Multiple acute infarcts were also present at that time.  IMPRESSION: Acute LEFT MCA territory posterior frontal cortical and subcortical white matter infarction. No hemorrhage or mass effect.  Progression of generalized atrophy and small vessel disease with sequelae of multiple prior infarcts which were acute in 2008.  No large vessel occlusion is evident.  Acute and chronic maxillary sinus disease.   Electronically Signed   By: Rolla Flatten M.D.   On: 10/26/2014 08:48   US Renal  10/26/2014   CLINICAL DATA:  Chronic kidney disease  EXAM: RENAL/URINARY TRACT ULTRASOUND COMPLETE  COMPARISON:  None.  FINDINGS: Right Kidney:  Length: 10.6 cm.  No mass or hydronephrosis.  Left Kidney:  Length: 11.0 cm.  No mass or hydronephrosis.  Bladder:  Within normal limits.  IMPRESSION: Negative renal ultrasound.   Electronically Signed   By: Julian Hy M.D.   On:  10/26/2014 08:15    Scheduled Meds: . clopidogrel  75 mg Oral Daily  . enoxaparin (LOVENOX) injection  30 mg Subcutaneous Q24H  . insulin aspart  0-15 Units Subcutaneous 6 times per day  . insulin glargine  20 Units Subcutaneous QHS  . metoprolol succinate  50 mg Oral Daily  . pravastatin  80 mg Oral Daily  . vitamin B-12  100 mcg Oral Daily   Continuous Infusions: . sodium chloride 75 mL/hr at 10/27/14 1552    Active Problems:   Coronary atherosclerosis   Cerebral artery occlusion with cerebral infarction   Disorder resulting from impaired renal function   Stroke   Right hemiparesis   Diabetes mellitus   Essential hypertension   HLD (hyperlipidemia)    Time spent: 40 minutes   [facility] Pager [phone]. If 7PM-7AM, please contact night-coverage at [email or web address], password [facility] 10/28/2014, 12:57 PM  LOS: 3 days

## 2014-10-29 LAB — GLUCOSE, CAPILLARY
GLUCOSE-CAPILLARY: 119 mg/dL — AB (ref 70–99)
GLUCOSE-CAPILLARY: 123 mg/dL — AB (ref 70–99)
GLUCOSE-CAPILLARY: 187 mg/dL — AB (ref 70–99)
Glucose-Capillary: 150 mg/dL — ABNORMAL HIGH (ref 70–99)
Glucose-Capillary: 221 mg/dL — ABNORMAL HIGH (ref 70–99)

## 2014-10-29 LAB — COMPREHENSIVE METABOLIC PANEL
ALT: 22 U/L (ref 0–53)
AST: 25 U/L (ref 0–37)
Albumin: 3 g/dL — ABNORMAL LOW (ref 3.5–5.2)
Alkaline Phosphatase: 41 U/L (ref 39–117)
Anion gap: 8 (ref 5–15)
BUN: 37 mg/dL — ABNORMAL HIGH (ref 6–23)
CO2: 24 mmol/L (ref 19–32)
Calcium: 9 mg/dL (ref 8.4–10.5)
Chloride: 106 mmol/L (ref 96–112)
Creatinine, Ser: 3.08 mg/dL — ABNORMAL HIGH (ref 0.50–1.35)
GFR, EST AFRICAN AMERICAN: 21 mL/min — AB (ref >90)
GFR, EST NON AFRICAN AMERICAN: 18 mL/min — AB (ref >90)
Glucose, Bld: 87 mg/dL (ref 70–99)
Potassium: 4 mmol/L (ref 3.5–5.1)
Sodium: 138 mmol/L (ref 135–145)
TOTAL PROTEIN: 6.2 g/dL (ref 6.0–8.3)
Total Bilirubin: 0.6 mg/dL (ref 0.3–1.2)

## 2014-10-29 MED ORDER — ACETAMINOPHEN 325 MG PO TABS
650.0000 mg | ORAL_TABLET | Freq: Four times a day (QID) | ORAL | Status: DC | PRN
  Filled 2014-10-29: qty 2

## 2014-10-29 NOTE — Progress Notes (Addendum)
[facility] PROGRESS NOTE  Jonuel Butterfield JGG:[id number] DOB: 1940-12-30 DOA: 10/25/2014 PCP: Stephens Shire, MD  Assessment/Plan: Active Problems:   Coronary atherosclerosis   Cerebral artery occlusion with cerebral infarction   Disorder resulting from impaired renal function   Stroke   Right hemiparesis   Diabetes mellitus   Essential hypertension   HLD (hyperlipidemia)      1. Acute left MCA stroke Right hemiparesis History of prior CVA in 2008, on aspirin 81 mg at home Continue telemetry, 2-D echo for quality, normal LV function, no overt valvular abnormalities Carotid Doppler shows 1-39% right carotid stenosis, 40-59% stenosis of the left internal carotid artery Venous Doppler negative Patient has been switched to Plavix by neurology -Neurology following -PTOT and speech therapy-elevating CIR, speech therapy recommends regular diet with thin liquids History of SVT-TEE negative for PFO, status post loop recorder ,by cardiology Unfortunately CIR consult pending, increasing length of stay  2. 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 Uncontrolled diabetes/hyperglycemia  Hemoglobin A1c 7.7, Accu-Chek stable Continue Lantus and sliding scale insulin   4. History of CAD Last cardiac catheterization in April of 2009; at that time he was found to have a 40% mid LAD, 70% ostial OM and a 90% stenosis in the mid right coronary artery followed by a 70% stenosis in the distal right coronary artery. Drug-eluting stents were placed to both lesions in the right coronary artery successfully. Plavix was stopped in Nov 2012 -Continue metoprolol, statin, now on Plavix  5. Hypertriglyceridemia, triglycerides 197<321,  4 years ago, continue  Pravachol  Code Status: full Family Communication: family updated about patient's clinical progress Disposition Plan:  Pending CIR consult , Alternative would be nursing home discharge   Brief narrative: [name] [name] is a 74 y.o. male with past medical history of prior embolic CVA with residual right-sided weakness,, SVT, CK D3, type 2 diabetes, CAD, hypertension, dyslipidemia presents today for the above complaint. His wife noticed slurring of speech when she called him today from work and right-sided weakness, worsened from his baseline right-sided weakness. His speech has slowly started improving he also had a CT angiogram done per neuro recommendations. CT head with no acute intracranial abnormalities. in addition also noted to have CBG in the 300s and crhe eatinine of 3, last creatinine in our system was 1.8 from 2011, reportedly he has seen a nephrologist with [facility] and is followed by his PCP for CKD  Consultants:  Neurology  Procedures:  None  Antibiotics: None  HPI/Subjective: No complaints, right upper extremity strength is unchanged, speech is improving  Objective: Filed Vitals:   10/28/14 2157 10/29/14 0128 10/29/14 0709 10/29/14 1042  BP: 149/64 157/59 154/52 139/62  Pulse: 65 66 68 68  Temp: 98.2 F (36.8 C) 97.6 F (36.4 C) 98.3 F (36.8 C) 97.8 F (36.6 C)  TempSrc: Oral Oral Oral Oral  Resp: 20 20 20 20   Height:      Weight:      SpO2: 98% 97% 97% 96%    Intake/Output Summary (Last 24 hours) at 10/29/14 1120 Last data filed at 10/29/14 0817  Gross per 24 hour  Intake    120 ml  Output    950 ml  Net   -830 ml    Exam:  General: No acute respiratory distress Lungs:  Clear to auscultation bilaterally without wheezes or crackles Cardiovascular: Regular rate and rhythm without murmur gallop or rub normal S1 and S2 Abdomen: Nontender, nondistended, soft, bowel sounds positive, no rebound, no ascites, no appreciable mass Extremities:  No significant cyanosis, clubbing, or edema bilateral lower extremities Neurology Motor Strength - The patient's strength was 3/5 RUE proximal and 4/5 distal, 5-/5 RLE and right pronator drift was present      Data Reviewed: Basic Metabolic Panel:  Recent Labs Lab 10/25/14 2210 10/25/14 2219 10/26/14 0500 10/27/14 0642 10/29/14 0540  NA 140 138 141 140 138  K 4.6 4.3 3.8 4.4 4.0  CL 106 107 108 108 106  CO2  --  27 24 21 24   GLUCOSE 316* 302* 107* 111* 87  BUN 43* 41* 36* 32* 37*  CREATININE 3.00* 3.03* 2.74* 2.76* 3.08*  CALCIUM  --  8.7 9.0 9.2 9.0    Liver Function Tests:  Recent Labs Lab 10/25/14 2219 10/27/14 0642 10/29/14 0540  AST 19 27 25   ALT 19 23 22   ALKPHOS 46 36* 41  BILITOT 0.3 0.5 0.6  PROT 6.0 6.5 6.2  ALBUMIN 2.9* 3.1* 3.0*   No results for input(s): LIPASE, AMYLASE in the last 168 hours. No results for input(s): AMMONIA in the last 168 hours.  CBC:  Recent Labs Lab 10/25/14 2210 10/25/14 2219 10/27/14 0642  WBC  --  10.0 9.6  NEUTROABS  --  5.2  --   HGB 12.9* 11.8* 12.0*  HCT 38.0* 36.1* 36.7*  MCV  --  88.0 88.0  PLT  --  263 257    Cardiac Enzymes: No results for input(s): CKTOTAL, CKMB, CKMBINDEX, TROPONINI in the last 168 hours. BNP (last 3 results) No results for input(s): BNP in the last 8760 hours.  ProBNP (last 3 results) No results for input(s): PROBNP in the last 8760 hours.    CBG:  Recent Labs Lab 10/28/14 1701 10/28/14 2011 10/28/14 2356 10/29/14 0402 10/29/14 0821  GLUCAP 202* 184* 222* 123* 150*    No results found for this or any previous visit (from the past 240 hour(s)).   Studies: Ct Angio Head W/cm &/or Wo Cm  10/25/2014   CLINICAL DATA:  RIGHT arm weakness, slurred speech, last seen normal at 1645 hours. History of hypertension, hyperlipidemia, diabetes, stroke.  EXAM: CT ANGIOGRAPHY HEAD AND NECK  TECHNIQUE: Multidetector CT imaging of the head and neck was performed using the standard protocol  during bolus administration of intravenous contrast. Multiplanar CT image reconstructions and MIPs were obtained to evaluate the vascular anatomy. Carotid stenosis measurements (when applicable) are obtained utilizing NASCET criteria, using the distal internal carotid diameter as the denominator.  CONTRAST:  43mL OMNIPAQUE IOHEXOL 350 MG/ML SOLN  COMPARISON:  MRI of the brain November 19, 2006  FINDINGS: CT HEAD  Brain: Mild to moderately motion degraded examination, particularly degrades evaluation skull base.  No intraparenchymal hemorrhage, mass effect, midline shift or acute large vascular territory infarct. LEFT frontoparietal encephalomalacia. Patchy to confluent supratentorial white matter hypodensities. Moderate to severe ventriculomegaly, likely on the basis of global parenchymal brain volume loss as there is overall commensurate enlargement of cerebral sulci and cerebellar folia. Remote RIGHT cystic basal ganglia and thalamus lacunar infarcts.  Basal cisterns are patent. Moderate calcific atherosclerosis of the carotid siphons.  Calvarium and skull base: No skull fracture though, assessment limited at the skull base. Patient is edentulous.  Paranasal sinuses: Probable maxillary sinusitis, limited by motion. Limited assessment of mastoid air cells.  Orbits:   Status post bilateral ocular lens implants.  CTA NECK  Normal appearance of the thoracic arch, normal branch pattern. Mild to moderate calcific atherosclerosis. The origins of the innominate, left Common carotid artery and subclavian artery are widely patent.  Bilateral Common carotid arteries are widely patent, coursing in a straight line fashion. Eccentric intimal thickening results in less than 40% stenosis of LEFT internal carotid artery. Mild calcific atherosclerosis of the RIGHT Common carotid artery. 2-3 mm eccentric intimal thickening and calcific atherosclerosis of the carotid bulbs extending to the internal carotid artery origins. Normal  appearance of the carotid bifurcations without hemodynamically significant stenosis by NASCET criteria. Approximately 40% stenosis of LEFT internal carotid artery origin. Normal appearance of the included internal carotid arteries.  Left vertebral artery is dominant. Atherosclerosis of the origin results in approximately 50% narrowing of RIGHT vertebral artery origin. The vertebral arteries  No dissection, no pseudoaneurysm. No abnormal luminal irregularity. No contrast extravasation.  9 mm LEFT thyroid nodule, below size surveillance recommendations. Fatty parotid glands. Patient is edentulous. Bilateral maxillary sinus mucosal thickening with air-fluid levels. No acute osseous process though bone windows have not been submitted.  CTA HEAD  Anterior circulation: Normal appearance of the cervical internal carotid arteries, petrous, cavernous and supra clinoid internal carotid arteries. Widely patent anterior communicating artery. Normal appearance of the anterior and middle cerebral arteries. Dolichoectatic intracranial vessels.  Posterior circulation: The LEFT vertebral artery is dominant with normal appearance of the vertebral arteries, vertebrobasilar junction and basilar artery, as well as main branch vessels. RIGHT posterior inferior cerebellar artery origin infundibulum. Large, robust bilateral posterior communicating arteries contribute the predominant vascular supply to the posterior cerebral arteries which are widely patent. Dolichoectatic intracranial vessels.  No large vessel occlusion, hemodynamically significant stenosis, dissection, luminal irregularity, contrast extravasation or aneurysm within the anterior nor posterior circulation.  IMPRESSION: CT HEAD:  Mild to moderate motion degraded examination.  No acute intracranial process. LEFT frontoparietal encephalomalacia suggest remote LEFT middle cerebral artery territory infarct.  Moderate to severe parenchymal brain volume loss. Moderate to severe  white matter changes suggest chronic small vessel ischemic disease. Chronic appearing RIGHT basal ganglia and RIGHT thalamus lacunar infarcts.  CTA NECK: Atherosclerosis of the carotid bulbs, with up to 40% stenosis of LEFT internal carotid artery by NASCET criteria.  Approximately 50% stenosis of vertebral artery origin.  Acute maxillary sinusitis.  CTA HEAD: Complete circle of Willis without large vessel occlusion or hemodynamically significant stenosis.  Dolichoectatic intracranial vessels suggests sequelae of chronic hypertension.  Acute findings discussed with and reconfirmed by Dr.PETER SUMNER on 10/25/2014 at 10:59 pm.   Electronically Signed   By: Elon Alas   On: 10/25/2014 23:00   Ct Angio Neck W/cm &/or Wo/cm  10/25/2014   CLINICAL DATA:  RIGHT arm weakness, slurred speech, last seen normal at 1645 hours. History of hypertension, hyperlipidemia, diabetes, stroke.  EXAM: CT ANGIOGRAPHY HEAD AND NECK  TECHNIQUE: Multidetector CT imaging of the head and neck was performed using the standard protocol during bolus administration of intravenous contrast. Multiplanar CT image reconstructions and MIPs were obtained to evaluate the vascular anatomy. Carotid stenosis measurements (when applicable) are obtained utilizing NASCET criteria, using the distal internal carotid diameter as the denominator.  CONTRAST:  22mL OMNIPAQUE IOHEXOL 350 MG/ML SOLN  COMPARISON:  MRI of the brain November 19, 2006  FINDINGS: CT HEAD  Brain: Mild to moderately motion degraded examination, particularly degrades evaluation skull base.  No intraparenchymal hemorrhage, mass effect, midline shift or acute  large vascular territory infarct. LEFT frontoparietal encephalomalacia. Patchy to confluent supratentorial white matter hypodensities. Moderate to severe ventriculomegaly, likely on the basis of global parenchymal brain volume loss as there is overall commensurate enlargement of cerebral sulci and cerebellar folia. Remote RIGHT cystic  basal ganglia and thalamus lacunar infarcts.  Basal cisterns are patent. Moderate calcific atherosclerosis of the carotid siphons.  Calvarium and skull base: No skull fracture though, assessment limited at the skull base. Patient is edentulous.  Paranasal sinuses: Probable maxillary sinusitis, limited by motion. Limited assessment of mastoid air cells.  Orbits:   Status post bilateral ocular lens implants.  CTA NECK  Normal appearance of the thoracic arch, normal branch pattern. Mild to moderate calcific atherosclerosis. The origins of the innominate, left Common carotid artery and subclavian artery are widely patent.  Bilateral Common carotid arteries are widely patent, coursing in a straight line fashion. Eccentric intimal thickening results in less than 40% stenosis of LEFT internal carotid artery. Mild calcific atherosclerosis of the RIGHT Common carotid artery. 2-3 mm eccentric intimal thickening and calcific atherosclerosis of the carotid bulbs extending to the internal carotid artery origins. Normal appearance of the carotid bifurcations without hemodynamically significant stenosis by NASCET criteria. Approximately 40% stenosis of LEFT internal carotid artery origin. Normal appearance of the included internal carotid arteries.  Left vertebral artery is dominant. Atherosclerosis of the origin results in approximately 50% narrowing of RIGHT vertebral artery origin. The vertebral arteries  No dissection, no pseudoaneurysm. No abnormal luminal irregularity. No contrast extravasation.  9 mm LEFT thyroid nodule, below size surveillance recommendations. Fatty parotid glands. Patient is edentulous. Bilateral maxillary sinus mucosal thickening with air-fluid levels. No acute osseous process though bone windows have not been submitted.  CTA HEAD  Anterior circulation: Normal appearance of the cervical internal carotid arteries, petrous, cavernous and supra clinoid internal carotid arteries. Widely patent anterior  communicating artery. Normal appearance of the anterior and middle cerebral arteries. Dolichoectatic intracranial vessels.  Posterior circulation: The LEFT vertebral artery is dominant with normal appearance of the vertebral arteries, vertebrobasilar junction and basilar artery, as well as main branch vessels. RIGHT posterior inferior cerebellar artery origin infundibulum. Large, robust bilateral posterior communicating arteries contribute the predominant vascular supply to the posterior cerebral arteries which are widely patent. Dolichoectatic intracranial vessels.  No large vessel occlusion, hemodynamically significant stenosis, dissection, luminal irregularity, contrast extravasation or aneurysm within the anterior nor posterior circulation.  IMPRESSION: CT HEAD:  Mild to moderate motion degraded examination.  No acute intracranial process. LEFT frontoparietal encephalomalacia suggest remote LEFT middle cerebral artery territory infarct.  Moderate to severe parenchymal brain volume loss. Moderate to severe white matter changes suggest chronic small vessel ischemic disease. Chronic appearing RIGHT basal ganglia and RIGHT thalamus lacunar infarcts.  CTA NECK: Atherosclerosis of the carotid bulbs, with up to 40% stenosis of LEFT internal carotid artery by NASCET criteria.  Approximately 50% stenosis of vertebral artery origin.  Acute maxillary sinusitis.  CTA HEAD: Complete circle of Willis without large vessel occlusion or hemodynamically significant stenosis.  Dolichoectatic intracranial vessels suggests sequelae of chronic hypertension.  Acute findings discussed with and reconfirmed by Dr.PETER SUMNER on 10/25/2014 at 10:59 pm.   Electronically Signed   By: Elon Alas   On: 10/25/2014 23:00   Mr Brain Wo Contrast  10/26/2014   CLINICAL DATA:  Right-sided weakness with slurred speech which began 10/25/2014. History of hypertension and diabetes. Initial encounter.  EXAM: MRI HEAD WITHOUT CONTRAST  TECHNIQUE:  Multiplanar, multiecho pulse sequences of the brain  and surrounding structures were obtained without intravenous contrast.  COMPARISON:  CT angio head neck 10/25/2014.  MRI brain 11/19/2006.  FINDINGS: Moderate-sized area of restricted diffusion affects the LEFT posterior frontal cortex and subcortical white matter representing acute infarction. No other areas of restricted diffusion are observed.  Generalized atrophy with prominence of the ventricles, cisterns, and sulci. Moderately extensive chronic microvascular ischemic change throughout the periventricular and subcortical white matter. LEFT frontoparietal encephalomalacia is asymmetric, with the patulous LEFT sylvian fissure, likely remote chronic ischemic insults. Similar chronic lacunar insults are seen in the RIGHT thalamus and RIGHT basal ganglia.  Flow voids are maintained. No foci of acute or chronic hemorrhage. Significant BILATERAL maxillary chronic paranasal sinus disease with both mucosal thickening as well as RIGHT maxillary air-fluid level suggesting acuity. No orbital findings. No mastoid fluid.  Good general agreement with prior CTA. Compared with the prior MR from 2008, there is significant progression of atrophy/encephalomalacia and small vessel disease. Multiple acute infarcts were also present at that time.  IMPRESSION: Acute LEFT MCA territory posterior frontal cortical and subcortical white matter infarction. No hemorrhage or mass effect.  Progression of generalized atrophy and small vessel disease with sequelae of multiple prior infarcts which were acute in 2008.  No large vessel occlusion is evident.  Acute and chronic maxillary sinus disease.   Electronically Signed   By: Rolla Flatten M.D.   On: 10/26/2014 08:48   US Renal  10/26/2014   CLINICAL DATA:  Chronic kidney disease  EXAM: RENAL/URINARY TRACT ULTRASOUND COMPLETE  COMPARISON:  None.  FINDINGS: Right Kidney:  Length: 10.6 cm.  No mass or hydronephrosis.  Left Kidney:  Length:  11.0 cm.  No mass or hydronephrosis.  Bladder:  Within normal limits.  IMPRESSION: Negative renal ultrasound.   Electronically Signed   By: Julian Hy M.D.   On: 10/26/2014 08:15    Scheduled Meds: . clopidogrel  75 mg Oral Daily  . enoxaparin (LOVENOX) injection  30 mg Subcutaneous Q24H  . insulin aspart  0-15 Units Subcutaneous 6 times per day  . insulin glargine  20 Units Subcutaneous QHS  . metoprolol succinate  50 mg Oral Daily  . pravastatin  80 mg Oral Daily  . vitamin B-12  100 mcg Oral Daily   Continuous Infusions: . sodium chloride 75 mL/hr at 10/27/14 1552    Active Problems:   Coronary atherosclerosis   Cerebral artery occlusion with cerebral infarction   Disorder resulting from impaired renal function   Stroke   Right hemiparesis   Diabetes mellitus   Essential hypertension   HLD (hyperlipidemia)    Time spent: 40 minutes   [facility] Pager [phone]. If 7PM-7AM, please contact night-coverage at [email or web address], password [facility] 10/29/2014, 11:20 AM  LOS: 4 days

## 2014-10-30 ENCOUNTER — Encounter (HOSPITAL_COMMUNITY): Payer: Self-pay | Admitting: Cardiology

## 2014-10-30 DIAGNOSIS — I639 Cerebral infarction, unspecified: Secondary | ICD-10-CM

## 2014-10-30 DIAGNOSIS — G8190 Hemiplegia, unspecified affecting unspecified side: Secondary | ICD-10-CM

## 2014-10-30 LAB — GLUCOSE, CAPILLARY
GLUCOSE-CAPILLARY: 192 mg/dL — AB (ref 70–99)
GLUCOSE-CAPILLARY: 112 mg/dL — AB (ref 70–99)
GLUCOSE-CAPILLARY: 85 mg/dL (ref 70–99)
GLUCOSE-CAPILLARY: 167 mg/dL — AB (ref 70–99)
Glucose-Capillary: 190 mg/dL — ABNORMAL HIGH (ref 70–99)

## 2014-10-30 LAB — COMPREHENSIVE METABOLIC PANEL
ALK PHOS: 40 U/L (ref 39–117)
ALT: 30 U/L (ref 0–53)
AST: 33 U/L (ref 0–37)
Albumin: 2.9 g/dL — ABNORMAL LOW (ref 3.5–5.2)
Anion gap: 6 (ref 5–15)
BUN: 43 mg/dL — ABNORMAL HIGH (ref 6–23)
CO2: 23 mmol/L (ref 19–32)
Calcium: 9 mg/dL (ref 8.4–10.5)
Chloride: 109 mmol/L (ref 96–112)
Creatinine, Ser: 2.99 mg/dL — ABNORMAL HIGH (ref 0.50–1.35)
GFR calc Af Amer: 22 mL/min — ABNORMAL LOW (ref >90)
GFR calc non Af Amer: 19 mL/min — ABNORMAL LOW (ref >90)
Glucose, Bld: 103 mg/dL — ABNORMAL HIGH (ref 70–99)
POTASSIUM: 4 mmol/L (ref 3.5–5.1)
Sodium: 138 mmol/L (ref 135–145)
TOTAL PROTEIN: 6.4 g/dL (ref 6.0–8.3)
Total Bilirubin: 0.5 mg/dL (ref 0.3–1.2)

## 2014-10-30 MED ORDER — INSULIN ASPART 100 UNIT/ML ~~LOC~~ SOLN
0.0000 [IU] | Freq: Three times a day (TID) | SUBCUTANEOUS | Status: DC
  Administered 2014-10-30: 3 [IU] via SUBCUTANEOUS

## 2014-10-30 MED ORDER — INSULIN ASPART 100 UNIT/ML ~~LOC~~ SOLN
3.0000 [IU] | Freq: Three times a day (TID) | SUBCUTANEOUS | Status: DC
  Administered 2014-10-30 – 2014-10-31 (×2): 3 [IU] via SUBCUTANEOUS

## 2014-10-30 NOTE — Clinical Social Work Placement (Addendum)
Clinical Social Work Department CLINICAL SOCIAL WORK PLACEMENT NOTE 10/30/2014  Patient:  [name] [name],[name] [name]  Account Number:  [id number] Admit date:  10/25/2014  Clinical Social Worker:  Greta Doom, LCSWA  Date/time:  10/30/2014 03:35 PM  Clinical Social Work is seeking post-discharge placement for this patient at the following level of care:   SKILLED NURSING   (*CSW will update this form in Epic as items are completed)   10/30/2014  Patient/family provided with [facility] Department of Clinical Social Work's list of facilities offering this level of care within the geographic area requested by the patient (or if unable, by the patient's family).  10/30/2014  Patient/family informed of their freedom to choose among providers that offer the needed level of care, that participate in Medicare, Medicaid or managed care program needed by the patient, have an available bed and are willing to accept the patient.  10/30/2014  Patient/family informed of [facility]' ownership interest in [facility], as well as of the fact that they are under no obligation to receive care at this facility.  PASARR submitted to EDS on 10/26/2014 PASARR number received on 10/26/2014  FL2 transmitted to all facilities in geographic area requested by pt/family on  10/30/2014 FL2 transmitted to all facilities within larger geographic area on 10/30/2014  Patient informed that his/her managed care company has contracts with or will negotiate with  certain facilities, including the following:     Patient/family informed of bed offers received:  10/30/2014 Patient chooses bed at [facility]  Physician recommends and patient chooses bed at    Patient to be transferred [facility]   on  10/31/2014 Patient to be transferred to facility by [facility]  Patient and family notified of transfer on 10/31/2014 Name of family member notified: Princess Perna, wife    The following physician request were  entered in Epic:   Additional Comments:  [place], MSW, [place]

## 2014-10-30 NOTE — Progress Notes (Signed)
Occupational Therapy Treatment Patient Details Name: [name] [name] MRN: [id number] DOB: 12-15-1940 Today's Date: 10/30/2014    History of present illness Patient is a 74 y/o male admitted with right sided weakness and slurred speech. PMH of prior embolic CVA with residual right-sided weakness,, SVT, CK D3, type 2 diabetes, CAD, hypertension and dyslipidemia. MRI brain-Acute LEFT MCA territory posterior frontal cortical and subcortical white matter infarction. S/p TEE 3/4.    OT comments  Patient progressing towards goals, continue plan of care. Continue to recommend CIR for extensive comprehensive rehabilitation as patient is very motivated to get back to his PLOF.    Follow Up Recommendations  CIR;Supervision/Assistance - 24 hour    Equipment Recommendations   (TBD)    Recommendations for Other Services Rehab consult    Precautions / Restrictions Precautions Precautions: Fall Restrictions Weight Bearing Restrictions: No       Mobility Bed Mobility Overal bed mobility: Needs Assistance   Rolling: Supervision Sidelying to sit: Supervision       General bed mobility comments: Cues required for safe and effective technique  Transfers Overall transfer level: Needs assistance Equipment used: None Transfers: Sit to/from Stand;Stand Pivot Transfers Sit to Stand: Min guard Stand pivot transfers: Min guard       General transfer comment: Min guard for balance during sit<>stand and stand pivot transfer    Balance Overall balance assessment: Needs assistance Sitting-balance support: Feet supported;No upper extremity supported Sitting balance-Leahy Scale: Poor   Postural control: Posterior [name] Standing balance support: No upper extremity supported Standing balance-Leahy Scale: Poor    ADL General ADL Comments: Patient with increased independence with functional mobility/transfers. Patient continues to require hand over hand assistance for functional use of RUE.  Completed RUE neur re-edu exercises, see RUE exercises for more information.      Cognition   Behavior During Therapy: WFL for tasks assessed/performed Overall Cognitive Status: Within Functional Limits for tasks assessed      Exercises General Exercises - Upper Extremity Shoulder Flexion: AAROM;Right;10 reps;Seated Shoulder Extension: AAROM;10 reps;Seated;Right Elbow Flexion: AAROM;Right;10 reps;Seated Elbow Extension: AAROM;Right;10 reps;Seated Wrist Flexion: AAROM;Right;10 reps;Seated Wrist Extension: AAROM;10 reps;Seated Digit Composite Flexion: AAROM;Right;10 reps;Seated Composite Extension: AAROM;Right;10 reps;Seated           Pertinent Vitals/ Pain       Pain Assessment: No/denies pain         Frequency Min 3X/week     Progress Toward Goals  OT Goals(current goals can now be found in the care plan section)  Progress towards OT goals: Progressing toward goals     Plan Discharge plan remains appropriate       End of Session Equipment Utilized During Treatment: Gait belt   Activity Tolerance Patient tolerated treatment well   Patient Left in chair;with call bell/phone within reach;with chair alarm set;with nursing/sitter in room     Time: 0945-1006 OT Time Calculation (min): 21 min  Charges: OT General Charges $OT Visit: 1 Procedure OT Treatments $Therapeutic Exercise: 8-22 mins  Toiya Morrish , MS, OTR/L, CLT Pager: [phone]  10/30/2014, 11:08 AM

## 2014-10-30 NOTE — Clinical Social Work Psychosocial (Addendum)
Clinical Social Work Department BRIEF PSYCHOSOCIAL ASSESSMENT 10/30/2014  Patient:  [name] [name],[name] [name]     Account Number:  [id number]     Admit date:  10/25/2014  Clinical Social Worker:  Marciano Sequin  Date/Time:  10/30/2014 03:20 PM  Referred by:  RN  Date Referred:  10/30/2014 Referred for  SNF Placement   Other Referral:   Interview type:  Patient Other interview type:    PSYCHOSOCIAL DATA Living Status:  WIFE Admitted from facility:   Level of care:   Primary support name:  [name] [name],[name] [name] Primary support relationship to patient:  SPOUSE Degree of support available:   [employer]    CURRENT CONCERNS Current Concerns  None Noted   Other Concerns:    SOCIAL WORK ASSESSMENT / PLAN CSW met the pt at the bedside.  CSW introduced self and purpose of the visit. CSW discussed alternate rehab placement. CSW inquired about the geographical location in which the pt would like to receive rehab from. Pt reported being unsure. Pt asked the CSW to call his wife to discuss it with her. CSW explained the SNF rehab process to the pt. CSW and pt discussed insurance and its relation to SNF rehab. CSW answered all questions in which the pt inquired about. CSW provided pt with contact information for further questions. CSW will continue to follow this pt and assist with discharge as needed.   Assessment/plan status:  Psychosocial Support/Ongoing Assessment of Needs Other assessment/ plan:   Information/referral to community resources:    PATIENT'S/FAMILY'S RESPONSE TO CURRENT DIAGNOSE: The pt presented with a flat affect and calm mood. Pt acknowledged having several medical problems that he is currently being treated for. Pt identified himself as an old man, who will never get better.   PATIENT'S/FAMILY'S RESPONSE TO PLAN OF CARE: The pt acknowledged needing rehab. The pt desire to engage in rehab, but remain unsure to what SNF he would like.   [place], MSW,  [place]

## 2014-10-30 NOTE — Progress Notes (Signed)
[facility] PROGRESS NOTE  [name] [name] DEY:[id number] DOB: 09/03/1940 DOA: 10/25/2014 PCP: Stephens Shire, MD  Assessment/Plan: Active Problems:   Coronary atherosclerosis   Cerebral artery occlusion with cerebral infarction   Disorder resulting from impaired renal function   Stroke   Right hemiparesis   Diabetes mellitus   Essential hypertension   HLD (hyperlipidemia)      1. Acute left MCA stroke Right hemiparesis History of prior CVA in 2008, on aspirin 81 mg at home Continue telemetry, 2-D echo for quality, normal LV function, no overt valvular abnormalities Carotid Doppler shows 1-39% right carotid stenosis, 40-59% stenosis of the left internal carotid artery Venous Doppler negative Patient has been switched to Plavix by neurology -Neurology following -PTOT and speech therapy-elevating CIR, speech therapy recommends regular diet with thin liquids History of SVT-TEE negative for PFO, status post loop recorder ,by cardiology CIR consult completed, bleeding or insurance approval   2. Probable chronic kidney disease, stage III Creatinine unchanged, 3.03> 2.74>3.08  no significant improvement with IV hydration, likely chronic and at baseline Negative renal ultrasound, unfortunately received contrast for CTA     Hypertension Continue metoprolol 50 mg  3.   diabetes/hyperglycemia  Hemoglobin A1c 7.7, Accu-Chek change to 3 times a day before meals Continue Lantus and sliding scale insulin  Start  Novolog 3 units TID wtih meals for meal coverage  4. History of CAD Last cardiac catheterization in April of 2009; at that time he was found to have a 40% mid LAD, 70% ostial OM and a 90% stenosis in the mid right coronary artery followed by a 70% stenosis in the distal right coronary artery. Drug-eluting stents were placed to both lesions in the right coronary artery successfully. Plavix was stopped in Nov 2012 -Continue metoprolol, statin, now on Plavix  5.  Hypertriglyceridemia, triglycerides 197<321,  4 years ago, continue Pravachol  Code Status: full Family Communication: family updated about patient's clinical progress Disposition Plan:  Pending CIR   discharge   Brief narrative: [name] [name] is a 74 y.o. male with past medical history of prior embolic CVA with residual right-sided weakness,, SVT, CK D3, type 2 diabetes, CAD, hypertension, dyslipidemia presents today for the above complaint. His wife noticed slurring of speech when she called him today from work and right-sided weakness, worsened from his baseline right-sided weakness. His speech has slowly started improving he also had a CT angiogram done per neuro recommendations. CT head with no acute intracranial abnormalities. in addition also noted to have CBG in the 300s and crhe eatinine of 3, last creatinine in our system was 1.8 from 2011, reportedly he has seen a nephrologist with [facility] and is followed by his PCP for CKD  Consultants:  Neurology  Procedures:  None  Antibiotics: None  HPI/Subjective: No complaints, sitting and watching television  Objective: Filed Vitals:   10/30/14 0136 10/30/14 0526 10/30/14 1018 10/30/14 1333  BP: 160/57 112/78 111/47 119/55  Pulse: 75 64 69 75  Temp: 97.8 F (36.6 C) 97.9 F (36.6 C) 97.6 F (36.4 C) 98.1 F (36.7 C)  TempSrc: Oral Oral Oral Oral  Resp: 20 20 20 20   Height:      Weight:      SpO2: 96% 96% 97% 97%    Intake/Output Summary (Last 24 hours) at 10/30/14 1339 Last data filed at 10/30/14 0528  Gross per 24 hour  Intake      0 ml  Output    750 ml  Net   -750 ml  Exam:  General: No acute respiratory distress Lungs: Clear to auscultation bilaterally without wheezes or crackles Cardiovascular: Regular rate and rhythm without murmur gallop or rub normal S1 and S2 Abdomen: Nontender, nondistended, soft, bowel sounds positive, no rebound, no ascites, no appreciable mass Extremities: No  significant cyanosis, clubbing, or edema bilateral lower extremities Neurology Motor Strength - The patient's strength was 3/5 RUE proximal and 4/5 distal, 5-/5 RLE and right pronator drift was present      Data Reviewed: Basic Metabolic Panel:  Recent Labs Lab 10/25/14 2219 10/26/14 0500 10/27/14 0642 10/29/14 0540 10/30/14 0655  NA 138 141 140 138 138  K 4.3 3.8 4.4 4.0 4.0  CL 107 108 108 106 109  CO2 27 24 21 24 23   GLUCOSE 302* 107* 111* 87 103*  BUN 41* 36* 32* 37* 43*  CREATININE 3.03* 2.74* 2.76* 3.08* 2.99*  CALCIUM 8.7 9.0 9.2 9.0 9.0    Liver Function Tests:  Recent Labs Lab 10/25/14 2219 10/27/14 0642 10/29/14 0540 10/30/14 0655  AST 19 27 25  33  ALT 19 23 22 30   ALKPHOS 46 36* 41 40  BILITOT 0.3 0.5 0.6 0.5  PROT 6.0 6.5 6.2 6.4  ALBUMIN 2.9* 3.1* 3.0* 2.9*   No results for input(s): LIPASE, AMYLASE in the last 168 hours. No results for input(s): AMMONIA in the last 168 hours.  CBC:  Recent Labs Lab 10/25/14 2210 10/25/14 2219 10/27/14 0642  WBC  --  10.0 9.6  NEUTROABS  --  5.2  --   HGB 12.9* 11.8* 12.0*  HCT 38.0* 36.1* 36.7*  MCV  --  88.0 88.0  PLT  --  263 257    Cardiac Enzymes: No results for input(s): CKTOTAL, CKMB, CKMBINDEX, TROPONINI in the last 168 hours. BNP (last 3 results) No results for input(s): BNP in the last 8760 hours.  ProBNP (last 3 results) No results for input(s): PROBNP in the last 8760 hours.    CBG:  Recent Labs Lab 10/29/14 1631 10/29/14 1956 10/29/14 2355 10/30/14 0427 10/30/14 0749  GLUCAP 221* 192* 187* 112* 85    No results found for this or any previous visit (from the past 240 hour(s)).   Studies: Ct Angio Head W/cm &/or Wo Cm  10/25/2014   CLINICAL DATA:  RIGHT arm weakness, slurred speech, last seen normal at 1645 hours. History of hypertension, hyperlipidemia, diabetes, stroke.  EXAM: CT ANGIOGRAPHY HEAD AND NECK  TECHNIQUE: Multidetector CT imaging of the head and neck was  performed using the standard protocol during bolus administration of intravenous contrast. Multiplanar CT image reconstructions and MIPs were obtained to evaluate the vascular anatomy. Carotid stenosis measurements (when applicable) are obtained utilizing NASCET criteria, using the distal internal carotid diameter as the denominator.  CONTRAST:  37mL OMNIPAQUE IOHEXOL 350 MG/ML SOLN  COMPARISON:  MRI of the brain November 19, 2006  FINDINGS: CT HEAD  Brain: Mild to moderately motion degraded examination, particularly degrades evaluation skull base.  No intraparenchymal hemorrhage, mass effect, midline shift or acute large vascular territory infarct. LEFT frontoparietal encephalomalacia. Patchy to confluent supratentorial white matter hypodensities. Moderate to severe ventriculomegaly, likely on the basis of global parenchymal brain volume loss as there is overall commensurate enlargement of cerebral sulci and cerebellar folia. Remote RIGHT cystic basal ganglia and thalamus lacunar infarcts.  Basal cisterns are patent. Moderate calcific atherosclerosis of the carotid siphons.  Calvarium and skull base: No skull fracture though, assessment limited at the skull base. Patient is edentulous.  Paranasal sinuses: Probable  maxillary sinusitis, limited by motion. Limited assessment of mastoid air cells.  Orbits:   Status post bilateral ocular lens implants.  CTA NECK  Normal appearance of the thoracic arch, normal branch pattern. Mild to moderate calcific atherosclerosis. The origins of the innominate, left Common carotid artery and subclavian artery are widely patent.  Bilateral Common carotid arteries are widely patent, coursing in a straight line fashion. Eccentric intimal thickening results in less than 40% stenosis of LEFT internal carotid artery. Mild calcific atherosclerosis of the RIGHT Common carotid artery. 2-3 mm eccentric intimal thickening and calcific atherosclerosis of the carotid bulbs extending to the internal  carotid artery origins. Normal appearance of the carotid bifurcations without hemodynamically significant stenosis by NASCET criteria. Approximately 40% stenosis of LEFT internal carotid artery origin. Normal appearance of the included internal carotid arteries.  Left vertebral artery is dominant. Atherosclerosis of the origin results in approximately 50% narrowing of RIGHT vertebral artery origin. The vertebral arteries  No dissection, no pseudoaneurysm. No abnormal luminal irregularity. No contrast extravasation.  9 mm LEFT thyroid nodule, below size surveillance recommendations. Fatty parotid glands. Patient is edentulous. Bilateral maxillary sinus mucosal thickening with air-fluid levels. No acute osseous process though bone windows have not been submitted.  CTA HEAD  Anterior circulation: Normal appearance of the cervical internal carotid arteries, petrous, cavernous and supra clinoid internal carotid arteries. Widely patent anterior communicating artery. Normal appearance of the anterior and middle cerebral arteries. Dolichoectatic intracranial vessels.  Posterior circulation: The LEFT vertebral artery is dominant with normal appearance of the vertebral arteries, vertebrobasilar junction and basilar artery, as well as main branch vessels. RIGHT posterior inferior cerebellar artery origin infundibulum. Large, robust bilateral posterior communicating arteries contribute the predominant vascular supply to the posterior cerebral arteries which are widely patent. Dolichoectatic intracranial vessels.  No large vessel occlusion, hemodynamically significant stenosis, dissection, luminal irregularity, contrast extravasation or aneurysm within the anterior nor posterior circulation.  IMPRESSION: CT HEAD:  Mild to moderate motion degraded examination.  No acute intracranial process. LEFT frontoparietal encephalomalacia suggest remote LEFT middle cerebral artery territory infarct.  Moderate to severe parenchymal brain  volume loss. Moderate to severe white matter changes suggest chronic small vessel ischemic disease. Chronic appearing RIGHT basal ganglia and RIGHT thalamus lacunar infarcts.  CTA NECK: Atherosclerosis of the carotid bulbs, with up to 40% stenosis of LEFT internal carotid artery by NASCET criteria.  Approximately 50% stenosis of vertebral artery origin.  Acute maxillary sinusitis.  CTA HEAD: Complete circle of Willis without large vessel occlusion or hemodynamically significant stenosis.  Dolichoectatic intracranial vessels suggests sequelae of chronic hypertension.  Acute findings discussed with and reconfirmed by Dr.PETER SUMNER on 10/25/2014 at 10:59 pm.   Electronically Signed   By: Elon Alas   On: 10/25/2014 23:00   Ct Angio Neck W/cm &/or Wo/cm  10/25/2014   CLINICAL DATA:  RIGHT arm weakness, slurred speech, last seen normal at 1645 hours. History of hypertension, hyperlipidemia, diabetes, stroke.  EXAM: CT ANGIOGRAPHY HEAD AND NECK  TECHNIQUE: Multidetector CT imaging of the head and neck was performed using the standard protocol during bolus administration of intravenous contrast. Multiplanar CT image reconstructions and MIPs were obtained to evaluate the vascular anatomy. Carotid stenosis measurements (when applicable) are obtained utilizing NASCET criteria, using the distal internal carotid diameter as the denominator.  CONTRAST:  45mL OMNIPAQUE IOHEXOL 350 MG/ML SOLN  COMPARISON:  MRI of the brain November 19, 2006  FINDINGS: CT HEAD  Brain: Mild to moderately motion degraded examination, particularly degrades evaluation  skull base.  No intraparenchymal hemorrhage, mass effect, midline shift or acute large vascular territory infarct. LEFT frontoparietal encephalomalacia. Patchy to confluent supratentorial white matter hypodensities. Moderate to severe ventriculomegaly, likely on the basis of global parenchymal brain volume loss as there is overall commensurate enlargement of cerebral sulci and  cerebellar folia. Remote RIGHT cystic basal ganglia and thalamus lacunar infarcts.  Basal cisterns are patent. Moderate calcific atherosclerosis of the carotid siphons.  Calvarium and skull base: No skull fracture though, assessment limited at the skull base. Patient is edentulous.  Paranasal sinuses: Probable maxillary sinusitis, limited by motion. Limited assessment of mastoid air cells.  Orbits:   Status post bilateral ocular lens implants.  CTA NECK  Normal appearance of the thoracic arch, normal branch pattern. Mild to moderate calcific atherosclerosis. The origins of the innominate, left Common carotid artery and subclavian artery are widely patent.  Bilateral Common carotid arteries are widely patent, coursing in a straight line fashion. Eccentric intimal thickening results in less than 40% stenosis of LEFT internal carotid artery. Mild calcific atherosclerosis of the RIGHT Common carotid artery. 2-3 mm eccentric intimal thickening and calcific atherosclerosis of the carotid bulbs extending to the internal carotid artery origins. Normal appearance of the carotid bifurcations without hemodynamically significant stenosis by NASCET criteria. Approximately 40% stenosis of LEFT internal carotid artery origin. Normal appearance of the included internal carotid arteries.  Left vertebral artery is dominant. Atherosclerosis of the origin results in approximately 50% narrowing of RIGHT vertebral artery origin. The vertebral arteries  No dissection, no pseudoaneurysm. No abnormal luminal irregularity. No contrast extravasation.  9 mm LEFT thyroid nodule, below size surveillance recommendations. Fatty parotid glands. Patient is edentulous. Bilateral maxillary sinus mucosal thickening with air-fluid levels. No acute osseous process though bone windows have not been submitted.  CTA HEAD  Anterior circulation: Normal appearance of the cervical internal carotid arteries, petrous, cavernous and supra clinoid internal carotid  arteries. Widely patent anterior communicating artery. Normal appearance of the anterior and middle cerebral arteries. Dolichoectatic intracranial vessels.  Posterior circulation: The LEFT vertebral artery is dominant with normal appearance of the vertebral arteries, vertebrobasilar junction and basilar artery, as well as main branch vessels. RIGHT posterior inferior cerebellar artery origin infundibulum. Large, robust bilateral posterior communicating arteries contribute the predominant vascular supply to the posterior cerebral arteries which are widely patent. Dolichoectatic intracranial vessels.  No large vessel occlusion, hemodynamically significant stenosis, dissection, luminal irregularity, contrast extravasation or aneurysm within the anterior nor posterior circulation.  IMPRESSION: CT HEAD:  Mild to moderate motion degraded examination.  No acute intracranial process. LEFT frontoparietal encephalomalacia suggest remote LEFT middle cerebral artery territory infarct.  Moderate to severe parenchymal brain volume loss. Moderate to severe white matter changes suggest chronic small vessel ischemic disease. Chronic appearing RIGHT basal ganglia and RIGHT thalamus lacunar infarcts.  CTA NECK: Atherosclerosis of the carotid bulbs, with up to 40% stenosis of LEFT internal carotid artery by NASCET criteria.  Approximately 50% stenosis of vertebral artery origin.  Acute maxillary sinusitis.  CTA HEAD: Complete circle of Willis without large vessel occlusion or hemodynamically significant stenosis.  Dolichoectatic intracranial vessels suggests sequelae of chronic hypertension.  Acute findings discussed with and reconfirmed by Dr.PETER SUMNER on 10/25/2014 at 10:59 pm.   Electronically Signed   By: Elon Alas   On: 10/25/2014 23:00   Mr Brain Wo Contrast  10/26/2014   CLINICAL DATA:  Right-sided weakness with slurred speech which began 10/25/2014. History of hypertension and diabetes. Initial encounter.  EXAM: MRI  HEAD WITHOUT CONTRAST  TECHNIQUE: Multiplanar, multiecho pulse sequences of the brain and surrounding structures were obtained without intravenous contrast.  COMPARISON:  CT angio head neck 10/25/2014.  MRI brain 11/19/2006.  FINDINGS: Moderate-sized area of restricted diffusion affects the LEFT posterior frontal cortex and subcortical white matter representing acute infarction. No other areas of restricted diffusion are observed.  Generalized atrophy with prominence of the ventricles, cisterns, and sulci. Moderately extensive chronic microvascular ischemic change throughout the periventricular and subcortical white matter. LEFT frontoparietal encephalomalacia is asymmetric, with the patulous LEFT sylvian fissure, likely remote chronic ischemic insults. Similar chronic lacunar insults are seen in the RIGHT thalamus and RIGHT basal ganglia.  Flow voids are maintained. No foci of acute or chronic hemorrhage. Significant BILATERAL maxillary chronic paranasal sinus disease with both mucosal thickening as well as RIGHT maxillary air-fluid level suggesting acuity. No orbital findings. No mastoid fluid.  Good general agreement with prior CTA. Compared with the prior MR from 2008, there is significant progression of atrophy/encephalomalacia and small vessel disease. Multiple acute infarcts were also present at that time.  IMPRESSION: Acute LEFT MCA territory posterior frontal cortical and subcortical white matter infarction. No hemorrhage or mass effect.  Progression of generalized atrophy and small vessel disease with sequelae of multiple prior infarcts which were acute in 2008.  No large vessel occlusion is evident.  Acute and chronic maxillary sinus disease.   Electronically Signed   By: Rolla Flatten M.D.   On: 10/26/2014 08:48   US Renal  10/26/2014   CLINICAL DATA:  Chronic kidney disease  EXAM: RENAL/URINARY TRACT ULTRASOUND COMPLETE  COMPARISON:  None.  FINDINGS: Right Kidney:  Length: 10.6 cm.  No mass or  hydronephrosis.  Left Kidney:  Length: 11.0 cm.  No mass or hydronephrosis.  Bladder:  Within normal limits.  IMPRESSION: Negative renal ultrasound.   Electronically Signed   By: Julian Hy M.D.   On: 10/26/2014 08:15    Scheduled Meds: . clopidogrel  75 mg Oral Daily  . enoxaparin (LOVENOX) injection  30 mg Subcutaneous Q24H  . insulin aspart  0-15 Units Subcutaneous TID WC  . insulin aspart  3 Units Subcutaneous TID WC  . insulin glargine  20 Units Subcutaneous QHS  . metoprolol succinate  50 mg Oral Daily  . pravastatin  80 mg Oral Daily  . vitamin B-12  100 mcg Oral Daily   Continuous Infusions: . sodium chloride 75 mL/hr at 10/27/14 1552    Active Problems:   Coronary atherosclerosis   Cerebral artery occlusion with cerebral infarction   Disorder resulting from impaired renal function   Stroke   Right hemiparesis   Diabetes mellitus   Essential hypertension   HLD (hyperlipidemia)    Time spent: 40 minutes   [facility] Pager [phone]. If 7PM-7AM, please contact night-coverage at [email or web address], password [facility] 10/30/2014, 1:39 PM  LOS: 5 days

## 2014-10-30 NOTE — Consult Note (Signed)
Physical Medicine and Rehabilitation Consult Reason for Consult: Acute Left MCA infarct consistent with cardioembolic stroke as well as history of CVA Referring Physician: [facility]   HPI: [name] [name] is a 74 y.o. 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 Patient lives with his wife independently prior to admission. Presented 10/25/2014 with increasing right sided weakness as well as slurred speech with blood pressure 180/86. MRI of the brain showed acute left MCA territory posterior frontal cortical and subcortical white matter infarct as well as multiple prior infarcts. Echocardiogram with normal overall left ventricular function no overt valvular abnormalities. Carotid Dopplers with 40-59% left ICA stenosis. Venous Doppler studies lower extremities negative for DVT. Patient did not receive TPA. CTA of the head without large vessel occlusion or significant stenosis. TEE completed 10/27/2014 showing no thrombus without PFO. Patient underwent loop recorder implantation 10/27/2014 per Dr. Caryl Comes. Neurology consulted presently on Plavix for CVA prophylaxis as well as subcutaneous Lovenox for DVT prophylaxis. Close monitoring of renal insufficiency lady's creatinine 2.76 which is baseline with renal ultrasound negative. Patient is tolerating a regular consistency diet. Physical and occupational therapy evaluations completed 10/27/2014 with recommendations of physical medicine rehabilitation consult.   Review of Systems  Cardiovascular: Positive for palpitations.  Gastrointestinal: Positive for constipation.  Genitourinary: Positive for urgency.  Neurological: Positive for weakness.  All other systems reviewed and are negative.  Past Medical History  Diagnosis  Date  . SUPRAVENTRICULAR TACHYCARDIA   . MYOCARDIAL INFARCTION   . HYPERTENSION   . HYPERLIPIDEMIA   . CEREBROVASCULAR DISEASE   . CEREBROVASCULAR ACCIDENT   . CAD   . RENAL INSUFFICIENCY   . DIABETES MELLITUS    Past Surgical History  Procedure Laterality Date  . Coronary stent placement    . Loop recorder implant N/A 10/27/2014    Procedure: LOOP RECORDER IMPLANT;  Surgeon: Deboraha Sprang, MD;  Location: [facility] [place] CATH LAB;  Service: Cardiovascular;  Laterality: N/A;   History reviewed. No pertinent family history. Social History:  reports that he has quit smoking. He does not have any smokeless tobacco history on file. He reports that he does not drink alcohol. His drug history is not on file. Allergies: No Known Allergies Medications Prior to Admission  Medication Sig Dispense Refill  . amLODipine (NORVASC) 5 MG tablet Take 5 mg by mouth daily.      Marland Kitchen aspirin 81 MG tablet Take 81 mg by mouth daily.      Marland Kitchen glipiZIDE (GLUCOTROL) 5 MG tablet Take 5 mg by mouth daily.      . metoprolol (TOPROL-XL) 50 MG 24 hr tablet Take 50 mg by mouth daily.      . niacin (NIASPAN) 1000 MG CR tablet Take 1,000 mg by mouth at bedtime.      . pravastatin (PRAVACHOL) 80 MG tablet Take 80 mg by mouth daily.    . vitamin B-12 (CYANOCOBALAMIN) 100 MCG tablet Take 100 mcg by mouth daily.    . insulin glargine (LANTUS) 100 UNIT/ML injection Inject 45-50 Units into the skin at bedtime. Depending on what is eaten    . pravastatin (PRAVACHOL) 80 MG tablet TAKE 1 TABLET DAILY (Patient not taking: Reported on 10/26/2014) 90 tablet 1    Home: [place] expects to be discharged to:: Private residence Living Arrangements: Spouse/significant other Available Help  at Discharge: Family, Available PRN/intermittently (wife  works) Type of Home: House Home Access: Stairs to enter Technical brewer of Steps: 4 Entrance Stairs-Rails: Right Home Layout: One level Home Equipment: None  Functional  History: Prior Function Level of Independence: Independent Functional Status:  Mobility: Bed Mobility Overal bed mobility: Needs Assistance Bed Mobility: Rolling, Sidelying to Sit Rolling: Supervision Sidelying to sit: Mod assist, HOB elevated General bed mobility comments: Mod A to elevate trunk, use of rails.  Transfers Overall transfer level: Needs assistance Equipment used: None Transfers: Sit to/from Stand Sit to Stand: Min assist General transfer comment: Min A to rise from EOB with cues for anterior translation. Right lateral trunk lean initially upon standing.  Ambulation/Gait Ambulation/Gait assistance: Mod assist Ambulation Distance (Feet): 6 Feet Assistive device: None Gait Pattern/deviations: Step-to pattern, Decreased stride length, Decreased stance time - right, Decreased step length - left, Narrow base of support Gait velocity interpretation: Below normal speed for age/gender General Gait Details: Pt with knee instability RLE during stance phase (partial knee buckling) and right lateral trunk lean. Short shuffling steps. Mod A to assist with advancing RLE and postural stability/control.    ADL: ADL Overall ADL's : Needs assistance/impaired Eating/Feeding: Maximal assistance, Sitting Eating/Feeding Details (indicate cue type and reason): max assist with hand over hand using Rhand  Grooming: Maximal assistance, Sitting Grooming Details (indicate cue type and reason): max assist with hand over hand using Rhand Upper Body Bathing: Moderate assistance, Sitting Lower Body Bathing: Maximal assistance, Sit to/from stand, Cueing for safety Upper Body Dressing : Moderate assistance, Sitting Lower Body Dressing: Maximal assistance, Sit to/from stand, Cueing for safety General ADL Comments: Patient with decreased motor and sensory function>RUE. Patient requires hand over hand assist for functional use of RUE. Educated patient on RUE AAROM exercises.    Cognition: Cognition Overall Cognitive Status: Within Functional Limits for tasks assessed Orientation Level: Oriented X4 Cognition Arousal/Alertness: Awake/alert Behavior During Therapy: WFL for tasks assessed/performed Overall Cognitive Status: Within Functional Limits for tasks assessed  Blood pressure 112/78, pulse 64, temperature 97.9 F (36.6 C), temperature source Oral, resp. rate 20, height 5\' 6"  (1.676 m), weight 85.5 kg (188 lb 7.9 oz), SpO2 96 %. Physical Exam  HENT:  Head: Normocephalic.  edentulous  Eyes: EOM are normal.  Neck: Normal range of motion. Neck supple. No thyromegaly present.  Cardiovascular:  Cardiac rate controlled  Respiratory: Effort normal and breath sounds normal. No respiratory distress.  GI: Soft. Bowel sounds are normal. He exhibits no distension.  Neurological: He is alert.  Makes good eye contact with examiner. His speech is somewhat dysarthric but intelligible. He is oriented to person, place age and date of birth. Follow simple commands. Fair awareness of his deficits. Mild right facial weakness. RUE: 2+ shoulder, biceps, tricep. Wrist and hand trace to 1/5. RLE: 3+hf,ke, adf/apf. LUE and LLE: near 5/5. Sensation 1+/2 Right arm and leg. Cognitively intact.  Skin: Skin is warm and dry.  Psychiatric: He has a normal mood and affect. His behavior is normal.    Results for orders placed or performed during the hospital encounter of 10/25/14 (from the past 24 hour(s))  Comprehensive metabolic panel     Status: Abnormal   Collection Time: 10/29/14  5:40 AM  Result Value Ref Range   Sodium 138 135 - 145 mmol/L   Potassium 4.0 3.5 - 5.1 mmol/L   Chloride 106 96 - 112 mmol/L   CO2 24 19 - 32 mmol/L   Glucose, Bld 87 70 - 99  mg/dL   BUN 37 (H) 6 - 23 mg/dL   Creatinine, Ser 3.08 (H) 0.50 - 1.35 mg/dL   Calcium 9.0 8.4 - 10.5 mg/dL   Total Protein 6.2 6.0 - 8.3 g/dL   Albumin 3.0 (L) 3.5 - 5.2 g/dL   AST 25 0 - 37 U/L   ALT 22 0 - 53 U/L    Alkaline Phosphatase 41 39 - 117 U/L   Total Bilirubin 0.6 0.3 - 1.2 mg/dL   GFR calc non Af Amer 18 (L) >90 mL/min   GFR calc Af Amer 21 (L) >90 mL/min   Anion gap 8 5 - 15  Glucose, capillary     Status: Abnormal   Collection Time: 10/29/14  8:21 AM  Result Value Ref Range   Glucose-Capillary 150 (H) 70 - 99 mg/dL   Comment 1 Notify RN    Comment 2 Documented in Char   Glucose, capillary     Status: Abnormal   Collection Time: 10/29/14 11:40 AM  Result Value Ref Range   Glucose-Capillary 119 (H) 70 - 99 mg/dL   Comment 1 Notify RN    Comment 2 Documented in Char   Glucose, capillary     Status: Abnormal   Collection Time: 10/29/14  4:31 PM  Result Value Ref Range   Glucose-Capillary 221 (H) 70 - 99 mg/dL   Comment 1 Notify RN    Comment 2 Documented in Char   Glucose, capillary     Status: Abnormal   Collection Time: 10/29/14 11:55 PM  Result Value Ref Range   Glucose-Capillary 187 (H) 70 - 99 mg/dL   Comment 1 Notify RN    Comment 2 Documented in Char   Glucose, capillary     Status: Abnormal   Collection Time: 10/30/14  4:27 AM  Result Value Ref Range   Glucose-Capillary 112 (H) 70 - 99 mg/dL   Comment 1 Notify RN    Comment 2 Documented in Char    No results found.  Assessment/Plan: Diagnosis: left MCA infarct 1. Does the need for close, 24 hr/day medical supervision in concert with the patient's rehab needs make it unreasonable for this patient to be served in a less intensive setting? Yes 2. Co-Morbidities requiring supervision/potential complications: htn, cad, dm 3. Due to bladder management, bowel management, safety, skin/wound care, disease management, medication administration, pain management and patient education, does the patient require 24 hr/day rehab nursing? Yes 4. Does the patient require coordinated care of a physician, rehab nurse, PT (1-2 hrs/day, 5 days/week) and OT (1-2 hrs/day, 5 days/week) to address physical and functional deficits in the context  of the above medical diagnosis(es)? Yes Addressing deficits in the following areas: balance, endurance, locomotion, strength, transferring, bowel/bladder control, bathing, dressing, feeding, grooming, toileting and psychosocial support 5. Can the patient actively participate in an intensive therapy program of at least 3 hrs of therapy per day at least 5 days per week? Yes 6. The potential for patient to make measurable gains while on inpatient rehab is excellent 7. Anticipated functional outcomes upon discharge from inpatient rehab are modified independent  with PT, modified independent with OT, n/a with SLP. 8. Estimated rehab length of stay to reach the above functional goals is: 8-10 days 9. Does the patient have adequate social supports and living environment to accommodate these discharge functional goals? Yes 10. Anticipated D/C setting: Home 11. Anticipated post D/C treatments: HH therapy and Outpatient therapy 12. Overall Rehab/Functional Prognosis: excellent  RECOMMENDATIONS: This patient's condition is  appropriate for continued rehabilitative care in the following setting: CIR Patient has agreed to participate in recommended program. Yes Note that insurance prior authorization may be required for reimbursement for recommended care.  Comment: Rehab Admissions Coordinator to follow up.  Thanks,  Meredith Staggers, MD, Mellody Drown     10/30/2014

## 2014-10-30 NOTE — Progress Notes (Signed)
Rehab admissions - I met with pt in follow up to rehab MD consult to explain the possibility of inpatient rehab. Informational brochures were given and further questions were answered. Pt is interested in pursuing inpatient rehab and wants to be as independent as possible when he returns home.  Pt confirmed that his wife works a typical Mon-Fri schedule and I noted that rehab MD has projected for Modified Independent goals at the end of a possible rehab admit.  Pt has [employer] and I will now open his case with his insurance to seek insurance authorization.  I will reach out to pt's wife later today to give information about our rehab program. I will keep the pt/family and medical team aware of any updates.  Thanks.  Nanetta Batty, PT Rehabilitation Admissions Coordinator [phone]

## 2014-10-30 NOTE — Clinical Social Work Note (Signed)
CSW reviewed chart. Per PT/OT notes the level of recommendation is CIR. CSW will coordinate with CIR staff to determine weather CIR will accept the pt.   [place], MSW, [place]

## 2014-10-30 NOTE — Progress Notes (Signed)
Physical Therapy Treatment Patient Details Name: [name] [name] MRN: [id number] DOB: [birth year] Today's Date: 10/30/2014    History of Present Illness Patient is a 74 y/o male admitted with right sided weakness and slurred speech. PMH of prior embolic CVA with residual right-sided weakness,, SVT, CK D3, type 2 diabetes, CAD, hypertension and dyslipidemia. MRI brain-Acute LEFT MCA territory posterior frontal cortical and subcortical white matter infarction. S/p TEE 3/4.     PT Comments    Patient progressing well with mobility. Continues to have difficulty advancing RLE requiring manual cues and assist. Min A for gait training and balance. Cues to decrease gait speed and widen BoS. Highly motivated. Continues to be appropriate for CIR.   Follow Up Recommendations  CIR     Equipment Recommendations  Other (comment)    Recommendations for Other Services       Precautions / Restrictions Precautions Precautions: Fall Restrictions Weight Bearing Restrictions: No    Mobility  Bed Mobility Overal bed mobility: Needs Assistance   Rolling: Supervision Sidelying to sit: Supervision       General bed mobility comments: Sitting in chair upon PT arrival.   Transfers Overall transfer level: Needs assistance Equipment used: None Transfers: Sit to/from Stand Sit to Stand: Min guard Stand pivot transfers: Min guard       General transfer comment: Min guard for balance during transfer. Cues for anterior translation and hand placement. Stood from chair x4. Emphasized controlled descent.  Ambulation/Gait Ambulation/Gait assistance: Min assist Ambulation Distance (Feet): 100 Feet (x2 bouts) Assistive device: None (Rail.) Gait Pattern/deviations: Step-through pattern;Decreased stride length;Decreased step length - right;Narrow base of support   Gait velocity interpretation: Below normal speed for age/gender General Gait Details: Manual cues to facilitate hip flexion to advance RLE.  Cues to widen BoS. Fatigues quickly. Towards end of gait, instances of knee instability. Seated rest break.   Stairs            Wheelchair Mobility    Modified Rankin (Stroke Patients Only) Modified Rankin (Stroke Patients Only) Pre-Morbid Rankin Score: No significant disability Modified Rankin: Moderately severe disability     Balance Overall balance assessment: Needs assistance Sitting-balance support: Feet supported;No upper extremity supported Sitting balance-Leahy Scale: Fair   Postural control: Posterior lean Standing balance support: During functional activity Standing balance-Leahy Scale: Fair                      Cognition Arousal/Alertness: Awake/alert Behavior During Therapy: WFL for tasks assessed/performed Overall Cognitive Status: Within Functional Limits for tasks assessed                      Exercises General Exercises - Upper Extremity Shoulder Flexion: AAROM;Right;10 reps;Seated Shoulder Extension: AAROM;10 reps;Seated;Right Elbow Flexion: AAROM;Right;10 reps;Seated Elbow Extension: AAROM;Right;10 reps;Seated Wrist Flexion: AAROM;Right;10 reps;Seated Wrist Extension: AAROM;10 reps;Seated Digit Composite Flexion: AAROM;Right;10 reps;Seated Composite Extension: AAROM;Right;10 reps;Seated Other Exercises Other Exercises: Pre gait activities in standing with UE support emphasizing step through gait on Right with full WB and knee stability~ 3minute.    General Comments        Pertinent Vitals/Pain Pain Assessment: No/denies pain    Home Living                      Prior Function            PT Goals (current goals can now be found in the care plan section) Progress towards PT goals: Progressing toward goals  Frequency  Min 4X/week    PT Plan Current plan remains appropriate    Co-evaluation             End of Session Equipment Utilized During Treatment: Gait belt Activity Tolerance: Patient  tolerated treatment well Patient left: Other (comment) (sitting in toilet. Tech notified to check on pt.)     Time: 1301-1324 PT Time Calculation (min) (ACUTE ONLY): 23 min  Charges:  $Gait Training: 8-22 mins $Therapeutic Activity: 8-22 mins                    G CodesCandy [name] A 2014-11-14, 1:33 PM [name] [name], [place], DPT [phone]

## 2014-10-30 NOTE — Progress Notes (Signed)
Inpatient Diabetes Program Recommendations  AACE/ADA: New Consensus Statement on Inpatient Glycemic Control (2013)  Target Ranges:  Prepandial:   less than 140 mg/dL      Peak postprandial:   less than 180 mg/dL (1-2 hours)      Critically ill patients:  140 - 180 mg/dL   Results for [name] [name], [name] [name] (MRN [id number]) as of 10/30/2014 11:40  Ref. Range 10/29/2014 08:21 10/29/2014 11:40 10/29/2014 16:31 10/29/2014 19:56 10/29/2014 23:55 10/30/2014 04:27 10/30/2014 07:49  Glucose-Capillary Latest Range: 70-99 mg/dL 150 (H) 119 (H) 221 (H) 192 (H) 187 (H) 112 (H) 85   Diabetes history: DM2 Outpatient Diabetes medications: Glipizide 5 mg QAM, Lantus 45-50 units QHS Current orders for Inpatient glycemic control: Novolog 0-15 units Q4H, Lantus 20 units QHS  Inpatient Diabetes Program Recommendations Correction (SSI): Please consider changing frequency of CBGs and Novolog correction to ACHS since patient is eating well. Insulin - Meal Coverage: Please consider ordering Novolog 3 units TID wtih meals for meal coverage.  Thanks, Barnie Alderman, RN, MSN, CCRN, CDE Diabetes Coordinator Inpatient Diabetes Program [phone] (Team Pager) [phone] ([facility] office) [phone] [facility] office)

## 2014-10-31 ENCOUNTER — Inpatient Hospital Stay (HOSPITAL_COMMUNITY): Payer: Medicare Other | Admitting: Rehabilitation

## 2014-10-31 ENCOUNTER — Inpatient Hospital Stay (HOSPITAL_COMMUNITY): Payer: Medicare Other | Admitting: Speech Pathology

## 2014-10-31 ENCOUNTER — Inpatient Hospital Stay (HOSPITAL_COMMUNITY): Payer: Medicare Other

## 2014-10-31 LAB — GLUCOSE, CAPILLARY
GLUCOSE-CAPILLARY: 146 mg/dL — AB (ref 70–99)
Glucose-Capillary: 120 mg/dL — ABNORMAL HIGH (ref 70–99)
Glucose-Capillary: 170 mg/dL — ABNORMAL HIGH (ref 70–99)
Glucose-Capillary: 234 mg/dL — ABNORMAL HIGH (ref 70–99)

## 2014-10-31 MED ORDER — CLOPIDOGREL BISULFATE 75 MG PO TABS: 75.0000 mg | ORAL_TABLET | Freq: Every day | ORAL | Status: DC

## 2014-10-31 MED ORDER — INSULIN ASPART 100 UNIT/ML ~~LOC~~ SOLN: 3.0000 [IU] | Freq: Three times a day (TID) | SUBCUTANEOUS | Status: DC

## 2014-10-31 MED ORDER — SENNOSIDES-DOCUSATE SODIUM 8.6-50 MG PO TABS: 1.0000 | ORAL_TABLET | Freq: Every evening | ORAL | Status: DC | PRN

## 2014-10-31 MED ORDER — INSULIN GLARGINE 100 UNIT/ML ~~LOC~~ SOLN
30.0000 [IU] | Freq: Every day | SUBCUTANEOUS | Status: DC
Start: 2014-10-31 — End: 2016-10-24

## 2014-10-31 NOTE — Discharge Summary (Signed)
Physician Discharge Summary  [name] [name] MRN: [id number] DOB/AGE: 11-04-1940 74 y.o.  PCP: Stephens Shire, MD   Admit date: 10/25/2014 Discharge date: 10/31/2014  Discharge Diagnoses:     Active Problems:   Coronary atherosclerosis   Cerebral artery occlusion with cerebral infarction   Disorder resulting from impaired renal function   Stroke   Right hemiparesis   Diabetes mellitus   Essential hypertension   HLD (hyperlipidemia)  Follow up recommendations  Cbc ,cmp in one week Neurology in 2 months      Medication List    STOP taking these medications        amLODipine 5 MG tablet  Commonly known as:  NORVASC     aspirin 81 MG tablet      TAKE these medications        clopidogrel 75 MG tablet  Commonly known as:  PLAVIX  Take 1 tablet (75 mg total) by mouth daily.     glipiZIDE 5 MG tablet  Commonly known as:  GLUCOTROL  Take 5 mg by mouth daily.     insulin aspart 100 UNIT/ML injection  Commonly known as:  novoLOG  Inject 3 Units into the skin 3 (three) times daily with meals.     insulin glargine 100 UNIT/ML injection  Commonly known as:  LANTUS  Inject 0.3 mLs (30 Units total) into the skin at bedtime.     metoprolol succinate 50 MG 24 hr tablet  Commonly known as:  TOPROL-XL  Take 50 mg by mouth daily.     niacin 1000 MG CR tablet  Commonly known as:  NIASPAN  Take 1,000 mg by mouth at bedtime.     pravastatin 80 MG tablet  Commonly known as:  PRAVACHOL  Take 80 mg by mouth daily.     senna-docusate 8.6-50 MG per tablet  Commonly known as:  Senokot-S  Take 1 tablet by mouth at bedtime as needed for mild constipation.     vitamin B-12 100 MCG tablet  Commonly known as:  CYANOCOBALAMIN  Take 100 mcg by mouth daily.        Discharge Condition: *  Disposition: SNF    Consults:  Neurology    Significant Diagnostic Studies: Ct Angio Head W/cm &/or Wo Cm  10/25/2014   CLINICAL DATA:  RIGHT arm weakness, slurred speech, last  seen normal at 1645 hours. History of hypertension, hyperlipidemia, diabetes, stroke.  EXAM: CT ANGIOGRAPHY HEAD AND NECK  TECHNIQUE: Multidetector CT imaging of the head and neck was performed using the standard protocol during bolus administration of intravenous contrast. Multiplanar CT image reconstructions and MIPs were obtained to evaluate the vascular anatomy. Carotid stenosis measurements (when applicable) are obtained utilizing NASCET criteria, using the distal internal carotid diameter as the denominator.  CONTRAST:  72m OMNIPAQUE IOHEXOL 350 MG/ML SOLN  COMPARISON:  MRI of the brain November 19, 2006  FINDINGS: CT HEAD  Brain: Mild to moderately motion degraded examination, particularly degrades evaluation skull base.  No intraparenchymal hemorrhage, mass effect, midline shift or acute large vascular territory infarct. LEFT frontoparietal encephalomalacia. Patchy to confluent supratentorial white matter hypodensities. Moderate to severe ventriculomegaly, likely on the basis of global parenchymal brain volume loss as there is overall commensurate enlargement of cerebral sulci and cerebellar folia. Remote RIGHT cystic basal ganglia and thalamus lacunar infarcts.  Basal cisterns are patent. Moderate calcific atherosclerosis of the carotid siphons.  Calvarium and skull base: No skull fracture though, assessment limited at the skull base. Patient is edentulous.  Paranasal sinuses: Probable maxillary sinusitis, limited by motion. Limited assessment of mastoid air cells.  Orbits:   Status post bilateral ocular lens implants.  CTA NECK  Normal appearance of the thoracic arch, normal branch pattern. Mild to moderate calcific atherosclerosis. The origins of the innominate, left Common carotid artery and subclavian artery are widely patent.  Bilateral Common carotid arteries are widely patent, coursing in a straight line fashion. Eccentric intimal thickening results in less than 40% stenosis of LEFT internal carotid  artery. Mild calcific atherosclerosis of the RIGHT Common carotid artery. 2-3 mm eccentric intimal thickening and calcific atherosclerosis of the carotid bulbs extending to the internal carotid artery origins. Normal appearance of the carotid bifurcations without hemodynamically significant stenosis by NASCET criteria. Approximately 40% stenosis of LEFT internal carotid artery origin. Normal appearance of the included internal carotid arteries.  Left vertebral artery is dominant. Atherosclerosis of the origin results in approximately 50% narrowing of RIGHT vertebral artery origin. The vertebral arteries  No dissection, no pseudoaneurysm. No abnormal luminal irregularity. No contrast extravasation.  9 mm LEFT thyroid nodule, below size surveillance recommendations. Fatty parotid glands. Patient is edentulous. Bilateral maxillary sinus mucosal thickening with air-fluid levels. No acute osseous process though bone windows have not been submitted.  CTA HEAD  Anterior circulation: Normal appearance of the cervical internal carotid arteries, petrous, cavernous and supra clinoid internal carotid arteries. Widely patent anterior communicating artery. Normal appearance of the anterior and middle cerebral arteries. Dolichoectatic intracranial vessels.  Posterior circulation: The LEFT vertebral artery is dominant with normal appearance of the vertebral arteries, vertebrobasilar junction and basilar artery, as well as main branch vessels. RIGHT posterior inferior cerebellar artery origin infundibulum. Large, robust bilateral posterior communicating arteries contribute the predominant vascular supply to the posterior cerebral arteries which are widely patent. Dolichoectatic intracranial vessels.  No large vessel occlusion, hemodynamically significant stenosis, dissection, luminal irregularity, contrast extravasation or aneurysm within the anterior nor posterior circulation.  IMPRESSION: CT HEAD:  Mild to moderate motion degraded  examination.  No acute intracranial process. LEFT frontoparietal encephalomalacia suggest remote LEFT middle cerebral artery territory infarct.  Moderate to severe parenchymal brain volume loss. Moderate to severe white matter changes suggest chronic small vessel ischemic disease. Chronic appearing RIGHT basal ganglia and RIGHT thalamus lacunar infarcts.  CTA NECK: Atherosclerosis of the carotid bulbs, with up to 40% stenosis of LEFT internal carotid artery by NASCET criteria.  Approximately 50% stenosis of vertebral artery origin.  Acute maxillary sinusitis.  CTA HEAD: Complete circle of Willis without large vessel occlusion or hemodynamically significant stenosis.  Dolichoectatic intracranial vessels suggests sequelae of chronic hypertension.  Acute findings discussed with and reconfirmed by Dr.PETER SUMNER on 10/25/2014 at 10:59 pm.   Electronically Signed   By: Elon Alas   On: 10/25/2014 23:00   Ct Angio Neck W/cm &/or Wo/cm  10/25/2014   CLINICAL DATA:  RIGHT arm weakness, slurred speech, last seen normal at 1645 hours. History of hypertension, hyperlipidemia, diabetes, stroke.  EXAM: CT ANGIOGRAPHY HEAD AND NECK  TECHNIQUE: Multidetector CT imaging of the head and neck was performed using the standard protocol during bolus administration of intravenous contrast. Multiplanar CT image reconstructions and MIPs were obtained to evaluate the vascular anatomy. Carotid stenosis measurements (when applicable) are obtained utilizing NASCET criteria, using the distal internal carotid diameter as the denominator.  CONTRAST:  86m OMNIPAQUE IOHEXOL 350 MG/ML SOLN  COMPARISON:  MRI of the brain November 19, 2006  FINDINGS: CT HEAD  Brain: Mild to moderately motion degraded examination,  particularly degrades evaluation skull base.  No intraparenchymal hemorrhage, mass effect, midline shift or acute large vascular territory infarct. LEFT frontoparietal encephalomalacia. Patchy to confluent supratentorial white matter  hypodensities. Moderate to severe ventriculomegaly, likely on the basis of global parenchymal brain volume loss as there is overall commensurate enlargement of cerebral sulci and cerebellar folia. Remote RIGHT cystic basal ganglia and thalamus lacunar infarcts.  Basal cisterns are patent. Moderate calcific atherosclerosis of the carotid siphons.  Calvarium and skull base: No skull fracture though, assessment limited at the skull base. Patient is edentulous.  Paranasal sinuses: Probable maxillary sinusitis, limited by motion. Limited assessment of mastoid air cells.  Orbits:   Status post bilateral ocular lens implants.  CTA NECK  Normal appearance of the thoracic arch, normal branch pattern. Mild to moderate calcific atherosclerosis. The origins of the innominate, left Common carotid artery and subclavian artery are widely patent.  Bilateral Common carotid arteries are widely patent, coursing in a straight line fashion. Eccentric intimal thickening results in less than 40% stenosis of LEFT internal carotid artery. Mild calcific atherosclerosis of the RIGHT Common carotid artery. 2-3 mm eccentric intimal thickening and calcific atherosclerosis of the carotid bulbs extending to the internal carotid artery origins. Normal appearance of the carotid bifurcations without hemodynamically significant stenosis by NASCET criteria. Approximately 40% stenosis of LEFT internal carotid artery origin. Normal appearance of the included internal carotid arteries.  Left vertebral artery is dominant. Atherosclerosis of the origin results in approximately 50% narrowing of RIGHT vertebral artery origin. The vertebral arteries  No dissection, no pseudoaneurysm. No abnormal luminal irregularity. No contrast extravasation.  9 mm LEFT thyroid nodule, below size surveillance recommendations. Fatty parotid glands. Patient is edentulous. Bilateral maxillary sinus mucosal thickening with air-fluid levels. No acute osseous process though bone  windows have not been submitted.  CTA HEAD  Anterior circulation: Normal appearance of the cervical internal carotid arteries, petrous, cavernous and supra clinoid internal carotid arteries. Widely patent anterior communicating artery. Normal appearance of the anterior and middle cerebral arteries. Dolichoectatic intracranial vessels.  Posterior circulation: The LEFT vertebral artery is dominant with normal appearance of the vertebral arteries, vertebrobasilar junction and basilar artery, as well as main branch vessels. RIGHT posterior inferior cerebellar artery origin infundibulum. Large, robust bilateral posterior communicating arteries contribute the predominant vascular supply to the posterior cerebral arteries which are widely patent. Dolichoectatic intracranial vessels.  No large vessel occlusion, hemodynamically significant stenosis, dissection, luminal irregularity, contrast extravasation or aneurysm within the anterior nor posterior circulation.  IMPRESSION: CT HEAD:  Mild to moderate motion degraded examination.  No acute intracranial process. LEFT frontoparietal encephalomalacia suggest remote LEFT middle cerebral artery territory infarct.  Moderate to severe parenchymal brain volume loss. Moderate to severe white matter changes suggest chronic small vessel ischemic disease. Chronic appearing RIGHT basal ganglia and RIGHT thalamus lacunar infarcts.  CTA NECK: Atherosclerosis of the carotid bulbs, with up to 40% stenosis of LEFT internal carotid artery by NASCET criteria.  Approximately 50% stenosis of vertebral artery origin.  Acute maxillary sinusitis.  CTA HEAD: Complete circle of Willis without large vessel occlusion or hemodynamically significant stenosis.  Dolichoectatic intracranial vessels suggests sequelae of chronic hypertension.  Acute findings discussed with and reconfirmed by Dr.PETER SUMNER on 10/25/2014 at 10:59 pm.   Electronically Signed   By: Elon Alas   On: 10/25/2014 23:00   Mr  Brain Wo Contrast  10/26/2014   CLINICAL DATA:  Right-sided weakness with slurred speech which began 10/25/2014. History of hypertension and diabetes. Initial encounter.  EXAM: MRI HEAD WITHOUT CONTRAST  TECHNIQUE: Multiplanar, multiecho pulse sequences of the brain and surrounding structures were obtained without intravenous contrast.  COMPARISON:  CT angio head neck 10/25/2014.  MRI brain 11/19/2006.  FINDINGS: Moderate-sized area of restricted diffusion affects the LEFT posterior frontal cortex and subcortical white matter representing acute infarction. No other areas of restricted diffusion are observed.  Generalized atrophy with prominence of the ventricles, cisterns, and sulci. Moderately extensive chronic microvascular ischemic change throughout the periventricular and subcortical white matter. LEFT frontoparietal encephalomalacia is asymmetric, with the patulous LEFT sylvian fissure, likely remote chronic ischemic insults. Similar chronic lacunar insults are seen in the RIGHT thalamus and RIGHT basal ganglia.  Flow voids are maintained. No foci of acute or chronic hemorrhage. Significant BILATERAL maxillary chronic paranasal sinus disease with both mucosal thickening as well as RIGHT maxillary air-fluid level suggesting acuity. No orbital findings. No mastoid fluid.  Good general agreement with prior CTA. Compared with the prior MR from 2008, there is significant progression of atrophy/encephalomalacia and small vessel disease. Multiple acute infarcts were also present at that time.  IMPRESSION: Acute LEFT MCA territory posterior frontal cortical and subcortical white matter infarction. No hemorrhage or mass effect.  Progression of generalized atrophy and small vessel disease with sequelae of multiple prior infarcts which were acute in 2008.  No large vessel occlusion is evident.  Acute and chronic maxillary sinus disease.   Electronically Signed   By: Rolla Flatten M.D.   On: 10/26/2014 08:48   US  Renal  10/26/2014   CLINICAL DATA:  Chronic kidney disease  EXAM: RENAL/URINARY TRACT ULTRASOUND COMPLETE  COMPARISON:  None.  FINDINGS: Right Kidney:  Length: 10.6 cm.  No mass or hydronephrosis.  Left Kidney:  Length: 11.0 cm.  No mass or hydronephrosis.  Bladder:  Within normal limits.  IMPRESSION: Negative renal ultrasound.   Electronically Signed   By: Julian Hy M.D.   On: 10/26/2014 08:15      Microbiology: No results found for this or any previous visit (from the past 240 hour(s)).   Labs: Results for orders placed or performed during the hospital encounter of 10/25/14 (from the past 48 hour(s))  Glucose, capillary     Status: Abnormal   Collection Time: 10/29/14 11:40 AM  Result Value Ref Range   Glucose-Capillary 119 (H) 70 - 99 mg/dL   Comment 1 Notify RN    Comment 2 Documented in Char   Glucose, capillary     Status: Abnormal   Collection Time: 10/29/14  4:31 PM  Result Value Ref Range   Glucose-Capillary 221 (H) 70 - 99 mg/dL   Comment 1 Notify RN    Comment 2 Documented in Char   Glucose, capillary     Status: Abnormal   Collection Time: 10/29/14  7:56 PM  Result Value Ref Range   Glucose-Capillary 192 (H) 70 - 99 mg/dL   Comment 1 Notify RN    Comment 2 Documented in Char   Glucose, capillary     Status: Abnormal   Collection Time: 10/29/14 11:55 PM  Result Value Ref Range   Glucose-Capillary 187 (H) 70 - 99 mg/dL   Comment 1 Notify RN    Comment 2 Documented in Char   Glucose, capillary     Status: Abnormal   Collection Time: 10/30/14  4:27 AM  Result Value Ref Range   Glucose-Capillary 112 (H) 70 - 99 mg/dL   Comment 1 Notify RN    Comment 2 Documented in Char  Comprehensive metabolic panel     Status: Abnormal   Collection Time: 10/30/14  6:55 AM  Result Value Ref Range   Sodium 138 135 - 145 mmol/L   Potassium 4.0 3.5 - 5.1 mmol/L   Chloride 109 96 - 112 mmol/L   CO2 23 19 - 32 mmol/L   Glucose, Bld 103 (H) 70 - 99 mg/dL   BUN 43 (H) 6 - 23  mg/dL   Creatinine, Ser 2.99 (H) 0.50 - 1.35 mg/dL   Calcium 9.0 8.4 - 10.5 mg/dL   Total Protein 6.4 6.0 - 8.3 g/dL   Albumin 2.9 (L) 3.5 - 5.2 g/dL   AST 33 0 - 37 U/L   ALT 30 0 - 53 U/L   Alkaline Phosphatase 40 39 - 117 U/L   Total Bilirubin 0.5 0.3 - 1.2 mg/dL   GFR calc non Af Amer 19 (L) >90 mL/min   GFR calc Af Amer 22 (L) >90 mL/min    Comment: (NOTE) The eGFR has been calculated using the CKD EPI equation. This calculation has not been validated in all clinical situations. eGFR's persistently <90 mL/min signify possible Chronic Kidney Disease.    Anion gap 6 5 - 15  Glucose, capillary     Status: None   Collection Time: 10/30/14  7:49 AM  Result Value Ref Range   Glucose-Capillary 85 70 - 99 mg/dL  Glucose, capillary     Status: Abnormal   Collection Time: 10/30/14  4:04 PM  Result Value Ref Range   Glucose-Capillary 190 (H) 70 - 99 mg/dL  Glucose, capillary     Status: Abnormal   Collection Time: 10/30/14  8:36 PM  Result Value Ref Range   Glucose-Capillary 167 (H) 70 - 99 mg/dL  Glucose, capillary     Status: Abnormal   Collection Time: 10/31/14 12:05 AM  Result Value Ref Range   Glucose-Capillary 170 (H) 70 - 99 mg/dL   Comment 1 Notify RN    Comment 2 Documented in Char   Glucose, capillary     Status: Abnormal   Collection Time: 10/31/14  4:08 AM  Result Value Ref Range   Glucose-Capillary 146 (H) 70 - 99 mg/dL   Comment 1 Notify RN    Comment 2 Documented in Char   Glucose, capillary     Status: Abnormal   Collection Time: 10/31/14  7:42 AM  Result Value Ref Range   Glucose-Capillary 120 (H) 70 - 99 mg/dL   Comment 1 Notify RN    Comment 2 Documented in Char      HPI :[name] [name] is a 74 y.o. 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 Patient lives with his wife independently prior to admission. Presented 10/25/2014 with increasing right sided weakness as well as slurred speech with blood pressure 180/86. MRI of the brain showed acute left MCA territory posterior frontal cortical and subcortical white matter infarct as well as multiple prior infarcts. Echocardiogram with normal overall left ventricular function no overt valvular abnormalities. Carotid Dopplers with 40-59% left ICA stenosis. Venous Doppler studies lower extremities negative for DVT. Patient did not receive TPA. CTA of the head without large vessel occlusion or significant stenosis. TEE completed 10/27/2014 showing no thrombus without PFO. Patient underwent loop recorder implantation 10/27/2014 per Dr. Caryl Comes. Neurology consulted with  Plavix initiated for CVA prophylaxis as well as subcutaneous Lovenox for DVT prophylaxis. Close monitoring of renal insufficiency lady's creatinine 2.76 which is baseline with renal ultrasound negative. Patient is tolerating a regular consistency diet. Physical and occupational therapy evaluations completed 10/27/2014 with recommendations of SNF   HOSPITAL COURSE: *   1. Acute left MCA stroke Right hemiparesis History of prior CVA in 2008, on aspirin 81 mg at home Continue telemetry, 2-D echo for quality, normal LV function, no overt valvular abnormalities Carotid Doppler shows 1-39% right carotid stenosis, 40-59% stenosis of the left internal carotid artery Venous Doppler negative Patient has been switched to Plavix by neurology -Neurology following -PTOT and speech therapy-elevating CIR, speech therapy recommends regular diet with thin liquids History of SVT-TEE negative for PFO, status post loop recorder ,by cardiology Pt will follow up with Dr. Erlinda Hong at [facility] in about 2 months  2. 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 diabetes/hyperglycemia  Hemoglobin A1c 7.7, Accu-Chek change to 3 times a day before meals Continue Lantus  At bedtime  Cont  Novolog 3 units TID wtih meals for meal coverage  4. History of CAD Last cardiac catheterization in April of 2009; at that time he was found to have a 40% mid LAD, 70% ostial OM and a 90% stenosis in the mid right coronary artery followed by a 70% stenosis in the distal right coronary artery. Drug-eluting stents were placed to both lesions in the right coronary artery successfully. Plavix was stopped in Nov 2012 -Continue metoprolol, statin, now on Plavix  5. Hypertriglyceridemia, triglycerides 197<321, 4 years ago, continue Pravachol  Code Status: full   Discharge Exam:  Blood pressure 139/61, pulse 65, temperature 97.6 F (36.4 C), temperature source Oral, resp. rate 20, height _0  (1.676 m), weight 85.5 kg (188 lb 7.9 oz), SpO2 98 %.  General: No acute respiratory distress Lungs: Clear to auscultation bilaterally without wheezes or crackles Cardiovascular: Regular rate and rhythm without murmur gallop or rub normal S1 and S2 Abdomen: Nontender, nondistended, soft, bowel sounds positive, no rebound, no ascites, no appreciable mass Extremities: No significant cyanosis, clubbing, or edema bilateral lower extremities Neurology Motor Strength - The patient's strength was 3/5 RUE proximal and 4/5 distal, 5-/5 RLE and right pronator drift was present        Discharge Instructions    Ambulatory referral to Neurology    Complete by:  As directed   Pt will follow up with Dr. Erlinda Hong at [facility] in about 2 months. Thanks.     Diet - low sodium heart healthy    Complete by:  As directed      Increase activity slowly    Complete by:  As directed            Follow-up  Information    Follow up with Xu,Jindong, MD. Schedule an appointment as soon as possible for a visit in 2 months.   Specialty:  Neurology   Why:  stroke clinic   Contact information:   [place] [address] [phone]       Follow up with BURNETT,BRENT A, MD. Schedule an appointment as soon as possible for a visit in 3 days.   Specialty:  Family Medicine   Contact information:   [address] [place] [address] [phone]       Signed: Reyne Dumas 10/31/2014, 9:59 AM

## 2014-10-31 NOTE — Progress Notes (Signed)
Discharge orders received, pt for discharge today to [facility].  IV and telemetry D/C.  D/C instructions and Rx in packet for receiving facility and report called to SNF. PTAR brought pt downstairs via stretcher.

## 2014-10-31 NOTE — Progress Notes (Signed)
Rehab admissions - I received a call back from pt's wife late yesterday afternoon and explained the possibility of inpatient rehab. Further discussion was had about the copays associated with [facility] with both CIR and SNF. Pt and his wife shared that they do not want to have the cost of the copay for CIR and they prefer to pursue skilled nursing.  I shared this information with Dysheka, social worker and explained that Dysheka would be following up with them to plan for SNF. Pt/wife had no further questions.   I also called and updated Hassan Rowan, case Freight forwarder.  I will now sign off pt's case in light of pt/wife's preference for SNF.  Thanks.  Nanetta Batty, PT Rehabilitation Admissions Coordinator [phone]

## 2014-11-03 ENCOUNTER — Non-Acute Institutional Stay (SKILLED_NURSING_FACILITY): Payer: Medicare Other | Admitting: Adult Health

## 2014-11-03 DIAGNOSIS — I639 Cerebral infarction, unspecified: Secondary | ICD-10-CM

## 2014-11-03 DIAGNOSIS — E1159 Type 2 diabetes mellitus with other circulatory complications: Secondary | ICD-10-CM

## 2014-11-03 DIAGNOSIS — E785 Hyperlipidemia, unspecified: Secondary | ICD-10-CM | POA: Diagnosis not present

## 2014-11-03 DIAGNOSIS — I10 Essential (primary) hypertension: Secondary | ICD-10-CM

## 2014-11-03 DIAGNOSIS — G8190 Hemiplegia, unspecified affecting unspecified side: Secondary | ICD-10-CM

## 2014-11-03 DIAGNOSIS — G8191 Hemiplegia, unspecified affecting right dominant side: Secondary | ICD-10-CM

## 2014-11-07 ENCOUNTER — Non-Acute Institutional Stay (SKILLED_NURSING_FACILITY): Payer: Medicare Other | Admitting: Internal Medicine

## 2014-11-07 DIAGNOSIS — E0822 Diabetes mellitus due to underlying condition with diabetic chronic kidney disease: Secondary | ICD-10-CM | POA: Diagnosis not present

## 2014-11-07 DIAGNOSIS — I2510 Atherosclerotic heart disease of native coronary artery without angina pectoris: Secondary | ICD-10-CM

## 2014-11-07 DIAGNOSIS — I10 Essential (primary) hypertension: Secondary | ICD-10-CM

## 2014-11-07 DIAGNOSIS — G8190 Hemiplegia, unspecified affecting unspecified side: Secondary | ICD-10-CM | POA: Diagnosis not present

## 2014-11-07 DIAGNOSIS — D519 Vitamin B12 deficiency anemia, unspecified: Secondary | ICD-10-CM | POA: Diagnosis not present

## 2014-11-07 DIAGNOSIS — E785 Hyperlipidemia, unspecified: Secondary | ICD-10-CM

## 2014-11-07 DIAGNOSIS — I6350 Cerebral infarction due to unspecified occlusion or stenosis of unspecified cerebral artery: Secondary | ICD-10-CM

## 2014-11-07 DIAGNOSIS — I639 Cerebral infarction, unspecified: Secondary | ICD-10-CM

## 2014-11-07 DIAGNOSIS — G8191 Hemiplegia, unspecified affecting right dominant side: Secondary | ICD-10-CM

## 2014-11-10 ENCOUNTER — Non-Acute Institutional Stay (SKILLED_NURSING_FACILITY): Payer: Medicare Other | Admitting: Adult Health

## 2014-11-10 DIAGNOSIS — G8190 Hemiplegia, unspecified affecting unspecified side: Secondary | ICD-10-CM | POA: Diagnosis not present

## 2014-11-10 DIAGNOSIS — I639 Cerebral infarction, unspecified: Secondary | ICD-10-CM

## 2014-11-10 DIAGNOSIS — G8191 Hemiplegia, unspecified affecting right dominant side: Secondary | ICD-10-CM

## 2014-11-10 DIAGNOSIS — I480 Paroxysmal atrial fibrillation: Secondary | ICD-10-CM | POA: Diagnosis not present

## 2014-11-10 DIAGNOSIS — I10 Essential (primary) hypertension: Secondary | ICD-10-CM

## 2014-11-13 ENCOUNTER — Ambulatory Visit (INDEPENDENT_AMBULATORY_CARE_PROVIDER_SITE_OTHER): Payer: Medicare Other | Admitting: Internal Medicine

## 2014-11-13 ENCOUNTER — Telehealth: Payer: Self-pay | Admitting: *Deleted

## 2014-11-13 ENCOUNTER — Encounter: Payer: Self-pay | Admitting: Internal Medicine

## 2014-11-13 ENCOUNTER — Ambulatory Visit: Payer: Medicare Other | Admitting: *Deleted

## 2014-11-13 VITALS — BP 128/66 | HR 87 | Ht 66.0 in | Wt 183.8 lb

## 2014-11-13 DIAGNOSIS — I25119 Atherosclerotic heart disease of native coronary artery with unspecified angina pectoris: Secondary | ICD-10-CM

## 2014-11-13 DIAGNOSIS — I10 Essential (primary) hypertension: Secondary | ICD-10-CM

## 2014-11-13 LAB — MDC_IDC_ENUM_SESS_TYPE_INCLINIC

## 2014-11-13 MED ORDER — RIVAROXABAN 15 MG PO TABS: 15.0000 mg | ORAL_TABLET | Freq: Every day | ORAL | Status: DC

## 2014-11-13 MED ORDER — ASPIRIN EC 81 MG PO TBEC: 81.0000 mg | DELAYED_RELEASE_TABLET | Freq: Every day | ORAL | Status: DC

## 2014-11-13 NOTE — Progress Notes (Signed)
Patient Care Team: Stephens Shire, MD as PCP - General (Family Medicine)   HPI  [name] [name] is a 74 y.o. male Seen as an add-on today following presentation in the hospital early March 2016 with his second stroke. Cardiac evaluation demonstrated normal LV function but no cause for stroke. He underwent loop recorder insertion. It identifies atrial fibrillation.  He has residual hemiparesis. He denies chest pain. Respiratory status is stable. No edema.  Past Medical History  Diagnosis Date  . SUPRAVENTRICULAR TACHYCARDIA   . MYOCARDIAL INFARCTION   . HYPERTENSION   . HYPERLIPIDEMIA   . CEREBROVASCULAR DISEASE   . CEREBROVASCULAR ACCIDENT   . CAD   . RENAL INSUFFICIENCY   . DIABETES MELLITUS     Past Surgical History  Procedure Laterality Date  . Coronary stent placement    . Loop recorder implant N/A 10/27/2014    Procedure: LOOP RECORDER IMPLANT;  Surgeon: Deboraha Sprang, MD;  Location: [facility] CATH LAB;  Service: Cardiovascular;  Laterality: N/A;  . Tee without cardioversion N/A 10/27/2014    Procedure: TRANSESOPHAGEAL ECHOCARDIOGRAM (TEE);  Surgeon: Dorothy Spark, MD;  Location: [facility] ENDOSCOPY;  Service: Cardiovascular;  Laterality: N/A;    Current Outpatient Prescriptions  Medication Sig Dispense Refill  . clopidogrel (PLAVIX) 75 MG tablet Take 1 tablet (75 mg total) by mouth daily. 30 tablet 2  . glipiZIDE (GLUCOTROL) 5 MG tablet Take 5 mg by mouth 2 (two) times daily before a meal.     . insulin glargine (LANTUS) 100 UNIT/ML injection Inject 0.3 mLs (30 Units total) into the skin at bedtime. 10 mL 11  . metoprolol (TOPROL-XL) 50 MG 24 hr tablet Take 50 mg by mouth daily.      . niacin (NIASPAN) 1000 MG CR tablet Take 1,000 mg by mouth at bedtime.      . pravastatin (PRAVACHOL) 80 MG tablet Take 80 mg by mouth daily.    . vitamin B-12 (CYANOCOBALAMIN) 100 MCG tablet Take 100 mcg by mouth daily.     No current facility-administered medications for this visit.     No Known Allergies  Review of Systems negative except from HPI and PMH  Physical Exam BP 128/66 mmHg  Pulse 87  Ht 5\' 6"  (1.676 m)  Wt 83.371 kg (183 lb 12.8 oz)  BMI 29.68 kg/m2 Well developed and well nourished in no acute distress HENT normal E scleral and icterus clear Neck Supple JVP flat; carotids brisk and full Clear to ausculation .Device pocket well healed; without hematoma or erythema.  There is no tethering Regular rate and rhythm, no murmurs gallops or rub Soft with active bowel sounds No clubbing cyanosis  Edema Alert and oriented, grossly  Right hemiparesis motor and sensory function Skin Warm and Dry  ECG demonstrates sinus rhythm at 87 Intervals 20/10/38  Assessment and  Plan  Cryptogenic stroke  Atrial fibrillation  Implantable loop recorder  Ischemic heart diseaseon  Renal failure  He has identified atrial fibrillation by loop recorder. It is appropriate in the context of his prior stroke in his CHADS-VASc score of greater than or equal to 6 that he undergo anticoagulation. His renal function is poor. He would be a candidate for Rivaroxaban or warfarin. He will check into the insurance issues. We will in this context discontinue his Plavix and begin him on low-dose aspirin for his stents.  We discussed the use of the NOACs compared to Coumadin. We briefly reviewed the data of at least comparability  in stroke prevention, bleeding and outcome. We discussed some of the new once wherein somewhat associated with decreased ischemic stroke risk, one to be taken daily, and has been shown to be comparable and bleeding risk to aspirin.  We also discussed bleeding associated with warfarin as well as NOACs and a wall bleeding as a complication of all these drugs intracranial bleeding is more frequently associated with warfarin then the NOACs and a GI bleeding is more commonly associated with the latter    In addition, we'll discontinue his niacin  We will  arrange follow-up with Dr. Stanford Breed

## 2014-11-13 NOTE — Patient Instructions (Signed)
Your physician has recommended you make the following change in your medication:  1) STOP Clopidogrel 2) STOP NIacin 3) START Xarelto 15 mg daily with dinner 4) START Aspirin 81 daily   Your physician recommends that you schedule a follow-up appointment in: 4-6 weeks with Dr. Stanford Breed

## 2014-11-13 NOTE — Telephone Encounter (Signed)
Patients wife, [name] called and stated that the patients insurance does not cover the xarelto. She can be reached at [phone]. Please advise. Thanks, MI

## 2014-11-14 NOTE — Telephone Encounter (Signed)
Patient informed me that [employer] told her that this medication was covered and they would send medication.

## 2014-11-22 ENCOUNTER — Encounter: Payer: Self-pay | Admitting: Internal Medicine

## 2014-11-22 DIAGNOSIS — G8191 Hemiplegia, unspecified affecting right dominant side: Secondary | ICD-10-CM

## 2014-11-22 DIAGNOSIS — I10 Essential (primary) hypertension: Secondary | ICD-10-CM

## 2014-11-22 DIAGNOSIS — I6350 Cerebral infarction due to unspecified occlusion or stenosis of unspecified cerebral artery: Secondary | ICD-10-CM

## 2014-11-22 DIAGNOSIS — D519 Vitamin B12 deficiency anemia, unspecified: Secondary | ICD-10-CM

## 2014-11-22 DIAGNOSIS — E0822 Diabetes mellitus due to underlying condition with diabetic chronic kidney disease: Secondary | ICD-10-CM

## 2014-11-22 DIAGNOSIS — I2510 Atherosclerotic heart disease of native coronary artery without angina pectoris: Secondary | ICD-10-CM

## 2014-11-22 DIAGNOSIS — E785 Hyperlipidemia, unspecified: Secondary | ICD-10-CM

## 2014-11-22 NOTE — Progress Notes (Signed)
Patient ID: [name] [name], male   DOB: August 08, 1941, 74 y.o.   MRN: [id number]    HISTORY AND PHYSICAL  Location:    [facility]   Place of Service:   SNF  Extended Emergency Contact Information Primary Emergency Contact: Keddy,Jean Address: [place] Phone: [phone] Relation: Spouse  Advanced Directive information  FULL CODE  Chief Complaint  Patient presents with  . 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 He has hx DM, HTN, CKD, hyperlipidemia, CAD and B12 deficiency. He c/o right arm/leg weakness. He had a loop recorder inserted prior to d/c to determine if he has an arrhythmia that caused CVA. He has no other concerns. Denies CP, SOB or palpitations. No HA or dizziness. No change in vision. He is taking plavix.  BP controlled with toprol XL  He takes glipizide and lantus with SSI at meals for DM. CBG 183 today  He has hyperlipidemia and takes niacin and pravachol  Past Medical History  Diagnosis Date  . SUPRAVENTRICULAR TACHYCARDIA   . MYOCARDIAL INFARCTION   . HYPERTENSION   . HYPERLIPIDEMIA   . CEREBROVASCULAR DISEASE   . CEREBROVASCULAR ACCIDENT   . CAD   . RENAL INSUFFICIENCY   . DIABETES MELLITUS     Past Surgical History  Procedure Laterality Date  . Coronary stent placement    . Loop recorder implant N/A 10/27/2014    Procedure: LOOP RECORDER IMPLANT;  Surgeon: Deboraha Sprang, MD;  Location: [facility] CATH LAB;  Service: Cardiovascular;  Laterality: N/A;  . Tee without cardioversion N/A 10/27/2014    Procedure: TRANSESOPHAGEAL ECHOCARDIOGRAM (TEE);  Surgeon: Dorothy Spark, MD;  Location: [facility];  Service: Cardiovascular;  Laterality: N/A;    Patient Care Team: Stephens Shire, MD as PCP - General (Family Medicine)  History   Social History  . Marital  Status: Married    Spouse Name: N/A  . Number of Children: N/A  . Years of Education: N/A   Occupational History  . Not on file.   Social History Main Topics  . Smoking status: Former Research scientist (life sciences)  . Smokeless tobacco: Not on file  . Alcohol Use: No  . Drug Use: Not on file  . Sexual Activity: Not on file   Other Topics Concern  . Not on file   Social History Narrative     reports that he has quit smoking. He does not have any smokeless tobacco history on file. He reports that he does not drink alcohol. His drug history is not on file.  Family History  Problem Relation Age of Onset  . Stroke Father    Family Status  Relation Status Death Age  . Mother Deceased   . Father Deceased      There is no immunization history on file for this patient.  No Known Allergies  Medications: Patient's Medications  New Prescriptions   No medications on file  Previous Medications   ASPIRIN EC 81 MG TABLET    Take 1 tablet (81 mg total) by mouth daily.   GLIPIZIDE (GLUCOTROL) 5 MG TABLET    Take 5 mg by mouth 2 (two) times daily before a meal.    INSULIN GLARGINE (LANTUS) 100 UNIT/ML INJECTION    Inject 0.3 mLs (30  Units total) into the skin at bedtime.   METOPROLOL (TOPROL-XL) 50 MG 24 HR TABLET    Take 50 mg by mouth daily.     PRAVASTATIN (PRAVACHOL) 80 MG TABLET    Take 80 mg by mouth daily.   RIVAROXABAN (XARELTO) 15 MG TABS TABLET    Take 1 tablet (15 mg total) by mouth daily with supper.   VITAMIN B-12 (CYANOCOBALAMIN) 100 MCG TABLET    Take 100 mcg by mouth daily.  Modified Medications   No medications on file  Discontinued Medications   No medications on file    Review of Systems  Constitutional: Negative for chills, activity change and fatigue.  HENT: Negative for sore throat and trouble swallowing.   Eyes: Negative for visual disturbance.  Respiratory: Negative for cough, chest tightness and shortness of breath.   Cardiovascular: Negative for chest pain, palpitations  and leg swelling.  Gastrointestinal: Negative for nausea, vomiting, abdominal pain and blood in stool.  Genitourinary: Negative for urgency, frequency and difficulty urinating.  Musculoskeletal: Negative for arthralgias and gait problem.  Skin: Negative for rash.  Neurological: Positive for weakness. Negative for headaches.  Psychiatric/Behavioral: Negative for confusion and sleep disturbance. The patient is not nervous/anxious.     Filed Vitals:   11/07/14 0035  BP: 125/70  Pulse: 78  Temp: 97.7 F (36.5 C)  Weight: 181 lb (82.101 kg)   Body mass index is 29.23 kg/(m^2).  Physical Exam  Constitutional: He is oriented to person, place, and time. He appears well-developed and well-nourished.  HENT:  Mouth/Throat: Oropharynx is clear and moist.  Eyes: Pupils are equal, round, and reactive to light. No scleral icterus.  Neck: Neck supple. No thyromegaly present.  Cardiovascular: Normal rate, regular rhythm and intact distal pulses.  Exam reveals no gallop and no friction rub.   Murmur (2/6 SEM) heard. No carotid bruit b/l; trace distal LE swelling  Pulmonary/Chest: Effort normal and breath sounds normal. He has no wheezes. He has no rales. He exhibits no tenderness.  Left ACW loop recorder intact  Abdominal: Soft. Bowel sounds are normal. He exhibits no distension, no abdominal bruit, no pulsatile midline mass and no mass. There is no tenderness. There is no rebound and no guarding.  Lymphadenopathy:    He has no cervical adenopathy.  Neurological: He is alert and oriented to person, place, and time. He displays abnormal reflex (right).  Right grip strength 2/5; b/l LE 3/5 strength;   Skin: Skin is warm, dry and intact. No rash noted.  Psychiatric: He has a normal mood and affect. His behavior is normal. Judgment and thought content normal.     Labs reviewed: Admission on 10/25/2014, Discharged on 10/31/2014  No results displayed because visit has over 200 results.      Ref  Range 69moago (10/30/14) 174mogo (10/29/14) 20m68moo (10/27/14)    Sodium 135 - 145 mmol/L 138 138 140    Potassium 3.5 - 5.1 mmol/L 4.0 4.0 4.4    Chloride 96 - 112 mmol/L 109 106 108    CO2 19 - 32 mmol/L '23 24 21    ' Glucose, Bld 70 - 99 mg/dL 103 (H) 87 111 (H)    BUN 6 - 23 mg/dL 43 (H) 37 (H) 32 (H)    Creatinine, Ser 0.50 - 1.35 mg/dL 2.99 (H) 3.08 (H) 2.76 (H)    Calcium 8.4 - 10.5 mg/dL 9.0 9.0 9.2    Total Protein 6.0 - 8.3 g/dL 6.4 6.2 6.5  Albumin 3.5 - 5.2 g/dL 2.9 (L) 3.0 (L) 3.1 (L)    AST 0 - 37 U/L 33 25 27    ALT 0 - 53 U/L '30 22 23    ' Alkaline Phosphatase 39 - 117 U/L 40 41 36 (L)    Total Bilirubin 0.3 - 1.2 mg/dL 0.5 0.6 0.5    GFR calc non Af Amer >90 mL/min 19 (L) 90 mL/min" class="[email or web address]" style="cursor: pointer; background-color: rgb(222, 231, 239);" onmouseover='jscript: var varStyle="underline"; var bgColor="#[id number]"; this.style.backgroundColor=bgColor; var children=this.getElementsByTagName("div"); for(var child=0;child 18 (L) 90 mL/min" class="[email or web address]" style="cursor: pointer; background-color: rgb(222, 231, 239);" onmouseover='jscript: var varStyle="underline"; var bgColor="#[id number]"; this.style.backgroundColor=bgColor; var children=this.getElementsByTagName("div"); for(var child=0;child 21 (L)    GFR calc Af Amer >90 mL/min 22 (L) 90 mL/min" class="[email or web address]" style="cursor: pointer; background-color: rgb(222, 231, 239);" onmouseover='jscript: var varStyle="underline"; var bgColor="#[email or web address]"; this.style.backgroundColor=bgColor; var children=this.getElementsByTagName("div"); for(var child=0;child 21 (L)CM 90 mL/min" class="[email or web address]" style="cursor: pointer; background-color: rgb(222, 231, 239);" onmouseover='jscript: var varStyle="underline"; var bgColor="#[email or web address]"; this.style.backgroundColor=bgColor; var children=this.getElementsByTagName("div"); for(var child=0;child 24 (L)CM   Comments: (NOTE)  The eGFR has been calculated using the CKD EPI equation.  This  calculation has not been validated in all clinical situations.  eGFR's persistently <90 mL/min signify possible Chronic Kidney  Disease.      Anion gap 5 - '15  6 8 11             ' Ref Range 18moago (10/27/14) 164mogo (10/25/14) 9m44moo (10/25/14)     WBC 4.0 - 10.5 K/uL 9.6 10.0     RBC 4.22 - 5.81 MIL/uL 4.17 (L) 4.10 (L)     Hemoglobin 13.0 - 17.0 g/dL 12.0 (L) 11.8 (L) 12.9 (L)    HCT 39.0 - 52.0 % 36.7 (L) 36.1 (L) 38.0 (L)    MCV 78.0 - 100.0 fL 88.0 88.0     MCH 26.0 - 34.0 pg 28.8 28.8     MCHC 30.0 - 36.0 g/dL 32.7 32.7     RDW 11.5 - 15.5 % 14.1 14.0     Platelets 150 - 400 K/uL 257 263    Resulting Agency  [facility]             Hgb A1c MFr Bld 10/27/14 4.8 - 5.6 % 7.7 (H)   Comments: (NOTE)      Pre-diabetes: 5.7 - 6.4      Diabetes: >6.4      Glycemic control for adults with diabetes: <7.0     Mean Plasma Glucose mg/dL 174            Ref Range 9mo79mo  67yr 367yr 31yr a36yr   Cholesterol 0 - 200 mg/dL 132 138R, CM 107R, CM    Triglycerides <150 mg/dL 197 (H) 321.0 (H)R, CM 145.0R, CM    HDL >39 mg/dL 28 (L) 39.00 mg/dL" class="[email or web address]" style="cursor: pointer;" onmouseover='jscript: var varStyle="underline"; var bgColor="#[email or web address]"; this.style.backgroundColor=bgColor; var children=this.getElementsByTagName("div"); for(var child=0;child 36.20 (L)R 39.00 mg/dL" class="[email or web address]" style="cursor: pointer;" onmouseover='jscript: var varStyle="underline"; var bgColor="#[email or web address]"; this.style.backgroundColor=bgColor; var children=this.getElementsByTagName("div"); for(var child=0;child 37.10 (L)R    Total CHOL/HDL Ratio RATIO 4.7 4R, CM 3R, CM    VLDL 0 - 40 mg/dL 39 64.2 (H)R 29.0R    LDL Cholesterol 0 - 99 mg/dL 65  41R   Comments:     Total Cholesterol/HDL:CHD Risk  Coronary Heart Disease Risk Table            Men  Women  1/2 Average Risk  3.4  3.3  Average Risk    5.0  4.4  2 X Average Risk  9.6  7.1    3 X Average Risk 23.4  11.0      Use the calculated Patient Ratio  above and the CHD Risk Table  to determine the patient's CHD Risk.      ATP III CLASSIFICATION (LDL):  <100   mg/dL  Optimal  100-129 mg/dL  Near or Above           Optimal  130-159 mg/dL  Borderline  160-189 mg/dL  High  >190   mg/dL  Very High     Resulting Agency  [facility]      Specimen Collected: 10/26/14 5:00 AM Last Resulted: 10/26/14 6:43 AM         Ct Angio Head W/cm &/or Wo Cm  10/25/2014   CLINICAL DATA:  RIGHT arm weakness, slurred speech, last seen normal at 1645 hours. History of hypertension, hyperlipidemia, diabetes, stroke.  EXAM: CT ANGIOGRAPHY HEAD AND NECK  TECHNIQUE: Multidetector CT imaging of the head and neck was performed using the standard protocol during bolus administration of intravenous contrast. Multiplanar CT image reconstructions and MIPs were obtained to evaluate the vascular anatomy. Carotid stenosis measurements (when applicable) are obtained utilizing NASCET criteria, using the distal internal carotid diameter as the denominator.  CONTRAST:  1m OMNIPAQUE IOHEXOL 350 MG/ML SOLN  COMPARISON:  MRI of the brain November 19, 2006  FINDINGS: CT HEAD  Brain: Mild to moderately motion degraded examination, particularly degrades evaluation skull base.  No intraparenchymal hemorrhage, mass effect, midline shift or acute large vascular territory infarct. LEFT frontoparietal encephalomalacia. Patchy to confluent supratentorial white matter hypodensities. Moderate to severe ventriculomegaly, likely on the basis of global parenchymal brain volume loss as there is overall commensurate enlargement of cerebral sulci and cerebellar folia. Remote RIGHT cystic basal ganglia and thalamus lacunar infarcts.  Basal cisterns are patent. Moderate calcific atherosclerosis of the carotid siphons.  Calvarium and skull base: No skull fracture though,  assessment limited at the skull base. Patient is edentulous.  Paranasal sinuses: Probable maxillary sinusitis, limited by motion. Limited assessment of mastoid air cells.  Orbits:   Status post bilateral ocular lens implants.  CTA NECK  Normal appearance of the thoracic arch, normal branch pattern. Mild to moderate calcific atherosclerosis. The origins of the innominate, left Common carotid artery and subclavian artery are widely patent.  Bilateral Common carotid arteries are widely patent, coursing in a straight line fashion. Eccentric intimal thickening results in less than 40% stenosis of LEFT internal carotid artery. Mild calcific atherosclerosis of the RIGHT Common carotid artery. 2-3 mm eccentric intimal thickening and calcific atherosclerosis of the carotid bulbs extending to the internal carotid artery origins. Normal appearance of the carotid bifurcations without hemodynamically significant stenosis by NASCET criteria. Approximately 40% stenosis of LEFT internal carotid artery origin. Normal appearance of the included internal carotid arteries.  Left vertebral artery is dominant. Atherosclerosis of the origin results in approximately 50% narrowing of RIGHT vertebral artery origin. The vertebral arteries  No dissection, no pseudoaneurysm. No abnormal luminal irregularity. No contrast extravasation.  9 mm LEFT thyroid nodule, below size surveillance recommendations. Fatty parotid glands. Patient is edentulous. Bilateral maxillary sinus mucosal thickening with air-fluid levels. No acute osseous process though bone windows have not been submitted.  CTA HEAD  Anterior circulation: Normal appearance of the cervical internal carotid arteries, petrous, cavernous and supra clinoid internal carotid arteries. Widely patent anterior communicating artery. Normal appearance of the anterior and middle cerebral arteries. Dolichoectatic intracranial vessels.  Posterior circulation: The LEFT  vertebral artery is dominant with  normal appearance of the vertebral arteries, vertebrobasilar junction and basilar artery, as well as main branch vessels. RIGHT posterior inferior cerebellar artery origin infundibulum. Large, robust bilateral posterior communicating arteries contribute the predominant vascular supply to the posterior cerebral arteries which are widely patent. Dolichoectatic intracranial vessels.  No large vessel occlusion, hemodynamically significant stenosis, dissection, luminal irregularity, contrast extravasation or aneurysm within the anterior nor posterior circulation.  IMPRESSION: CT HEAD:  Mild to moderate motion degraded examination.  No acute intracranial process. LEFT frontoparietal encephalomalacia suggest remote LEFT middle cerebral artery territory infarct.  Moderate to severe parenchymal brain volume loss. Moderate to severe white matter changes suggest chronic small vessel ischemic disease. Chronic appearing RIGHT basal ganglia and RIGHT thalamus lacunar infarcts.  CTA NECK: Atherosclerosis of the carotid bulbs, with up to 40% stenosis of LEFT internal carotid artery by NASCET criteria.  Approximately 50% stenosis of vertebral artery origin.  Acute maxillary sinusitis.  CTA HEAD: Complete circle of Willis without large vessel occlusion or hemodynamically significant stenosis.  Dolichoectatic intracranial vessels suggests sequelae of chronic hypertension.  Acute findings discussed with and reconfirmed by Dr.PETER SUMNER on 10/25/2014 at 10:59 pm.   Electronically Signed   By: Elon Alas   On: 10/25/2014 23:00   Ct Angio Neck W/cm &/or Wo/cm  10/25/2014   CLINICAL DATA:  RIGHT arm weakness, slurred speech, last seen normal at 1645 hours. History of hypertension, hyperlipidemia, diabetes, stroke.  EXAM: CT ANGIOGRAPHY HEAD AND NECK  TECHNIQUE: Multidetector CT imaging of the head and neck was performed using the standard protocol during bolus administration of intravenous contrast. Multiplanar CT image  reconstructions and MIPs were obtained to evaluate the vascular anatomy. Carotid stenosis measurements (when applicable) are obtained utilizing NASCET criteria, using the distal internal carotid diameter as the denominator.  CONTRAST:  82m OMNIPAQUE IOHEXOL 350 MG/ML SOLN  COMPARISON:  MRI of the brain November 19, 2006  FINDINGS: CT HEAD  Brain: Mild to moderately motion degraded examination, particularly degrades evaluation skull base.  No intraparenchymal hemorrhage, mass effect, midline shift or acute large vascular territory infarct. LEFT frontoparietal encephalomalacia. Patchy to confluent supratentorial white matter hypodensities. Moderate to severe ventriculomegaly, likely on the basis of global parenchymal brain volume loss as there is overall commensurate enlargement of cerebral sulci and cerebellar folia. Remote RIGHT cystic basal ganglia and thalamus lacunar infarcts.  Basal cisterns are patent. Moderate calcific atherosclerosis of the carotid siphons.  Calvarium and skull base: No skull fracture though, assessment limited at the skull base. Patient is edentulous.  Paranasal sinuses: Probable maxillary sinusitis, limited by motion. Limited assessment of mastoid air cells.  Orbits:   Status post bilateral ocular lens implants.  CTA NECK  Normal appearance of the thoracic arch, normal branch pattern. Mild to moderate calcific atherosclerosis. The origins of the innominate, left Common carotid artery and subclavian artery are widely patent.  Bilateral Common carotid arteries are widely patent, coursing in a straight line fashion. Eccentric intimal thickening results in less than 40% stenosis of LEFT internal carotid artery. Mild calcific atherosclerosis of the RIGHT Common carotid artery. 2-3 mm eccentric intimal thickening and calcific atherosclerosis of the carotid bulbs extending to the internal carotid artery origins. Normal appearance of the carotid bifurcations without hemodynamically significant  stenosis by NASCET criteria. Approximately 40% stenosis of LEFT internal carotid artery origin. Normal appearance of the included internal carotid arteries.  Left vertebral artery is dominant. Atherosclerosis of the origin results in approximately 50% narrowing of RIGHT vertebral  artery origin. The vertebral arteries  No dissection, no pseudoaneurysm. No abnormal luminal irregularity. No contrast extravasation.  9 mm LEFT thyroid nodule, below size surveillance recommendations. Fatty parotid glands. Patient is edentulous. Bilateral maxillary sinus mucosal thickening with air-fluid levels. No acute osseous process though bone windows have not been submitted.  CTA HEAD  Anterior circulation: Normal appearance of the cervical internal carotid arteries, petrous, cavernous and supra clinoid internal carotid arteries. Widely patent anterior communicating artery. Normal appearance of the anterior and middle cerebral arteries. Dolichoectatic intracranial vessels.  Posterior circulation: The LEFT vertebral artery is dominant with normal appearance of the vertebral arteries, vertebrobasilar junction and basilar artery, as well as main branch vessels. RIGHT posterior inferior cerebellar artery origin infundibulum. Large, robust bilateral posterior communicating arteries contribute the predominant vascular supply to the posterior cerebral arteries which are widely patent. Dolichoectatic intracranial vessels.  No large vessel occlusion, hemodynamically significant stenosis, dissection, luminal irregularity, contrast extravasation or aneurysm within the anterior nor posterior circulation.  IMPRESSION: CT HEAD:  Mild to moderate motion degraded examination.  No acute intracranial process. LEFT frontoparietal encephalomalacia suggest remote LEFT middle cerebral artery territory infarct.  Moderate to severe parenchymal brain volume loss. Moderate to severe white matter changes suggest chronic small vessel ischemic disease. Chronic  appearing RIGHT basal ganglia and RIGHT thalamus lacunar infarcts.  CTA NECK: Atherosclerosis of the carotid bulbs, with up to 40% stenosis of LEFT internal carotid artery by NASCET criteria.  Approximately 50% stenosis of vertebral artery origin.  Acute maxillary sinusitis.  CTA HEAD: Complete circle of Willis without large vessel occlusion or hemodynamically significant stenosis.  Dolichoectatic intracranial vessels suggests sequelae of chronic hypertension.  Acute findings discussed with and reconfirmed by Dr.PETER SUMNER on 10/25/2014 at 10:59 pm.   Electronically Signed   By: Elon Alas   On: 10/25/2014 23:00   Mr Brain Wo Contrast  10/26/2014   CLINICAL DATA:  Right-sided weakness with slurred speech which began 10/25/2014. History of hypertension and diabetes. Initial encounter.  EXAM: MRI HEAD WITHOUT CONTRAST  TECHNIQUE: Multiplanar, multiecho pulse sequences of the brain and surrounding structures were obtained without intravenous contrast.  COMPARISON:  CT angio head neck 10/25/2014.  MRI brain 11/19/2006.  FINDINGS: Moderate-sized area of restricted diffusion affects the LEFT posterior frontal cortex and subcortical white matter representing acute infarction. No other areas of restricted diffusion are observed.  Generalized atrophy with prominence of the ventricles, cisterns, and sulci. Moderately extensive chronic microvascular ischemic change throughout the periventricular and subcortical white matter. LEFT frontoparietal encephalomalacia is asymmetric, with the patulous LEFT sylvian fissure, likely remote chronic ischemic insults. Similar chronic lacunar insults are seen in the RIGHT thalamus and RIGHT basal ganglia.  Flow voids are maintained. No foci of acute or chronic hemorrhage. Significant BILATERAL maxillary chronic paranasal sinus disease with both mucosal thickening as well as RIGHT maxillary air-fluid level suggesting acuity. No orbital findings. No mastoid fluid.  Good general  agreement with prior CTA. Compared with the prior MR from 2008, there is significant progression of atrophy/encephalomalacia and small vessel disease. Multiple acute infarcts were also present at that time.  IMPRESSION: Acute LEFT MCA territory posterior frontal cortical and subcortical white matter infarction. No hemorrhage or mass effect.  Progression of generalized atrophy and small vessel disease with sequelae of multiple prior infarcts which were acute in 2008.  No large vessel occlusion is evident.  Acute and chronic maxillary sinus disease.   Electronically Signed   By: Rolla Flatten M.D.   On: 10/26/2014 08:48  US Renal  10/26/2014   CLINICAL DATA:  Chronic kidney disease  EXAM: RENAL/URINARY TRACT ULTRASOUND COMPLETE  COMPARISON:  None.  FINDINGS: Right Kidney:  Length: 10.6 cm.  No mass or hydronephrosis.  Left Kidney:  Length: 11.0 cm.  No mass or hydronephrosis.  Bladder:  Within normal limits.  IMPRESSION: Negative renal ultrasound.   Electronically Signed   By: Julian Hy M.D.   On: 10/26/2014 08:15   Hospital records reviewed - MRI revealed acute left MCA stroke; 2D echo showed nml LV fxn; carotid doppler showed 40-59% stenosis left ICA and 1-39% right ICA; TEE neg for PFO; venous doppler LE neg  Assessment/Plan    ICD-9-CM ICD-10-CM   1. Cerebral artery occlusion with cerebral infarction - etiology unknown; cont plavix 434.91 I63.9   2. 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 Essential hypertension controlled on toprol xl 401.9 I10   4. HLD (hyperlipidemia) - cont statin and niacin 272.4 E78.5   5. Right hemiparesis due to #1 342.90 G81.90   6. Atherosclerosis of native coronary artery of native heart without angina pectoris - s/p stent; loop recorder inserted 414.01 I25.10   7.      B12 deficiency anemia - cont B12 supplement  --PT/OT/ST as  indicated  --maintain tight glycemic control  --f/u with cardiology and neurology as scheduled  --check CBC and CMP in 1 week to follow blood counts and renal fxn  --GOAL: short term rehab and d/c home when medically appropriate. Communicated with pt and nursing.  Sarenity Ramaker S. Perlie Gold  [facility] [address] [place], [place] [address] [phone] Office (Wednesdays and Fridays 8 AM - 5 PM) [phone] Cell (Monday-Friday 8 AM - 5 PM)  This encounter was created in error - please disregard.

## 2014-11-27 ENCOUNTER — Ambulatory Visit (INDEPENDENT_AMBULATORY_CARE_PROVIDER_SITE_OTHER): Payer: Medicare Other | Admitting: *Deleted

## 2014-11-27 DIAGNOSIS — I25119 Atherosclerotic heart disease of native coronary artery with unspecified angina pectoris: Secondary | ICD-10-CM | POA: Diagnosis not present

## 2014-11-28 NOTE — Progress Notes (Signed)
Loop recorder 

## 2014-12-10 ENCOUNTER — Encounter: Payer: Self-pay | Admitting: Internal Medicine

## 2014-12-10 DIAGNOSIS — D519 Vitamin B12 deficiency anemia, unspecified: Secondary | ICD-10-CM | POA: Insufficient documentation

## 2014-12-10 NOTE — Progress Notes (Signed)
Patient ID: [name] [name], male DOB: 1940/10/16, 74 y.o. MRN: [id number]    HISTORY AND PHYSICAL  Location:   [facility]   Place of Service:   SNF  Extended Emergency Contact Information Primary Emergency Contact: Ditommaso,Jean Address: [address] [place] Phone: [phone] Relation: Spouse  Advanced Directive information  FULL CODE  Chief Complaint  Patient presents with  . 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 He has hx DM, HTN, CKD, hyperlipidemia, CAD and B12 deficiency. He c/o right arm/leg weakness. He had a loop recorder inserted prior to d/c to determine if he has an arrhythmia that caused CVA. He has no other concerns. Denies CP, SOB or palpitations. No HA or dizziness. No change in vision. He is taking plavix.  BP controlled with toprol XL  He takes glipizide and lantus with SSI at meals for DM. CBG 183 today  He has hyperlipidemia and takes niacin and pravachol  Past Medical History  Diagnosis Date  . SUPRAVENTRICULAR TACHYCARDIA   . MYOCARDIAL INFARCTION   . HYPERTENSION   . HYPERLIPIDEMIA   . CEREBROVASCULAR DISEASE   . CEREBROVASCULAR ACCIDENT   . CAD   . RENAL INSUFFICIENCY   . DIABETES MELLITUS     Past Surgical History  Procedure Laterality Date  . Coronary stent placement    . Loop recorder implant N/A 10/27/2014    Procedure: LOOP RECORDER IMPLANT; Surgeon: Deboraha Sprang, MD; Location: [facility] CATH LAB; Service: Cardiovascular; Laterality: N/A;  . Tee without cardioversion N/A 10/27/2014    Procedure: TRANSESOPHAGEAL ECHOCARDIOGRAM (TEE); Surgeon: Dorothy Spark, MD; Location: [facility]; Service: Cardiovascular; Laterality: N/A;    Patient Care Team: Stephens Shire, MD as PCP - General  (Family Medicine)  History   Social History  . Marital Status: Married    Spouse Name: N/A  . Number of Children: N/A  . Years of Education: N/A   Occupational History  . Not on file.   Social History Main Topics  . Smoking status: Former Research scientist (life sciences)  . Smokeless tobacco: Not on file  . Alcohol Use: No  . Drug Use: Not on file  . Sexual Activity: Not on file   Other Topics Concern  . Not on file   Social History Narrative     reports that he has quit smoking. He does not have any smokeless tobacco history on file. He reports that he does not drink alcohol. His drug history is not on file.  Family History  Problem Relation Age of Onset  . Stroke Father    Family Status  Relation Status Death Age  . Mother Deceased   . Father Deceased      There is no immunization history on file for this patient.  No Known Allergies  Medications: Patient's Medications  New Prescriptions   No medications on file  Previous Medications   Plavix 75 mg tablet Take 1 po daily   GLIPIZIDE (GLUCOTROL) 5 MG TABLET  Take 5 mg by mouth 2 (two) times daily before a meal.    INSULIN GLARGINE (LANTUS) 100 UNIT/ML INJECTION  Inject 0.3 mLs (30 Units total) into the skin at bedtime.   METOPROLOL (TOPROL-XL) 50 MG 24 HR TABLET  Take 50 mg by mouth daily.    PRAVASTATIN (PRAVACHOL) 80 MG TABLET  Take 80  mg by mouth daily.   niaspan er 10649m tablet Take 10036mpo qhs   VITAMIN B-12 (CYANOCOBALAMIN) 100 MCG TABLET  Take 100 mcg by mouth daily.  Modified Medications   No medications on file  Discontinued Medications   No medications on file    Review of Systems  Constitutional: Negative for chills, activity change and fatigue.  HENT: Negative for sore throat and trouble swallowing.  Eyes: Negative for visual disturbance.  Respiratory: Negative for cough, chest tightness and shortness  of breath.  Cardiovascular: Negative for chest pain, palpitations and leg swelling.  Gastrointestinal: Negative for nausea, vomiting, abdominal pain and blood in stool.  Genitourinary: Negative for urgency, frequency and difficulty urinating.  Musculoskeletal: Negative for arthralgias and gait problem.  Skin: Negative for rash.  Neurological: Positive for weakness. Negative for headaches.  Psychiatric/Behavioral: Negative for confusion and sleep disturbance. The patient is not nervous/anxious.    Filed Vitals:   11/07/14 0035  BP: 125/70  Pulse: 78  Temp: 97.7 F (36.5 C)  Weight: 181 lb (82.101 kg)   Body mass index is 29.23 kg/(m^2).  Physical Exam  Constitutional: He is oriented to person, place, and time. He appears well-developed and well-nourished.  HENT:  Mouth/Throat: Oropharynx is clear and moist.  Eyes: Pupils are equal, round, and reactive to light. No scleral icterus.  Neck: Neck supple. No thyromegaly present.  Cardiovascular: Normal rate, regular rhythm and intact distal pulses. Exam reveals no gallop and no friction rub.  Murmur (2/6 SEM) heard. No carotid bruit b/l; trace distal LE swelling  Pulmonary/Chest: Effort normal and breath sounds normal. He has no wheezes. He has no rales. He exhibits no tenderness.  Left ACW loop recorder intact  Abdominal: Soft. Bowel sounds are normal. He exhibits no distension, no abdominal bruit, no pulsatile midline mass and no mass. There is no tenderness. There is no rebound and no guarding.  Lymphadenopathy:   He has no cervical adenopathy.  Neurological: He is alert and oriented to person, place, and time. He displays abnormal reflex (right).  Right grip strength 2/5; b/l LE 3/5 strength;  Skin: Skin is warm, dry and intact. No rash noted.  Psychiatric: He has a normal mood and affect. His behavior is normal. Judgment and thought content normal.     Labs reviewed: Admission on 10/25/2014, Discharged on  10/31/2014  No results displayed because visit has over 200 results.      Ref Range 49m79moo (10/30/14) 49mo15mo (10/29/14) 49mo 96mo(10/27/14)    Sodium 135 - 145 mmol/L 138 138 140    Potassium 3.5 - 5.1 mmol/L 4.0 4.0 4.4    Chloride 96 - 112 mmol/L 109 106 108    CO2 19 - 32 mmol/L _0 Glucose, Bld 70 - 99 mg/dL 103 (H) 87 111 (H)    BUN 6 - 23 mg/dL 43 (H) 37 (H) 32 (H)    Creatinine, Ser 0.50 - 1.35 mg/dL 2.99 (H) 3.08 (H) 2.76 (H)    Calcium 8.4 - 10.5 mg/dL 9.0 9.0 9.2    Total Protein 6.0 - 8.3 g/dL 6.4 6.2 6.5    Albumin 3.5 - 5.2 g/dL 2.9 (L) 3.0 (L) 3.1 (L)    AST 0 - 37 U/L 33 25 27    ALT 0 - 53 U/L _1 Alkaline Phosphatase 39 - 117 U/L 40 41 36 (L)    Total Bilirubin 0.3 - 1.2 mg/dL 0.5 0.6 0.5  GFR calc non Af Amer >90 mL/min 19 (L) 90 mL/min" class="[email or web address]" style="cursor: pointer; background-color: rgb(222, 231, 239);" onmouseover='jscript: var varStyle="underline"; var bgColor="#[id number]"; this.style.backgroundColor=bgColor; var children=this.getElementsByTagName("div"); for(var child=0;child 18 (L) 90 mL/min" class="[email or web address]" style="cursor: pointer; background-color: rgb(222, 231, 239);" onmouseover='jscript: var varStyle="underline"; var bgColor="#[id number]"; this.style.backgroundColor=bgColor; var children=this.getElementsByTagName("div"); for(var child=0;child 21 (L)    GFR calc Af Amer >90 mL/min 22 (L) 90 mL/min" class="[email or web address]" style="cursor: pointer; background-color: rgb(222, 231, 239);" onmouseover='jscript: var varStyle="underline"; var bgColor="#[email or web address]"; this.style.backgroundColor=bgColor; var children=this.getElementsByTagName("div"); for(var child=0;child 21 (L)CM 90 mL/min" class="[email or web address]" style="cursor: pointer; background-color: rgb(222, 231, 239);" onmouseover='jscript: var varStyle="underline"; var bgColor="#[email or web address]";  this.style.backgroundColor=bgColor; var children=this.getElementsByTagName("div"); for(var child=0;child 24 (L)CM   Comments: (NOTE)  The eGFR has been calculated using the CKD EPI equation.  This calculation has not been validated in all clinical situations.  eGFR's persistently <90 mL/min signify possible Chronic Kidney  Disease.      Anion gap 5 - _0 Ref Range 55moago (10/27/14) 167mogo (10/25/14) 11m17moo (10/25/14)     WBC 4.0 - 10.5 K/uL 9.6 10.0     RBC 4.22 - 5.81 MIL/uL 4.17 (L) 4.10 (L)     Hemoglobin 13.0 - 17.0 g/dL 12.0 (L) 11.8 (L) 12.9 (L)    HCT 39.0 - 52.0 % 36.7 (L) 36.1 (L) 38.0 (L)    MCV 78.0 - 100.0 fL 88.0 88.0     MCH 26.0 - 34.0 pg 28.8 28.8     MCHC 30.0 - 36.0 g/dL 32.7 32.7     RDW 11.5 - 15.5 % 14.1 14.0     Platelets 150 - 400 K/uL 257 263    Resulting Agency  [facility]             Hgb A1c MFr Bld 10/27/14 4.8 - 5.6 % 7.7 (H)   Comments: (NOTE)      Pre-diabetes: 5.7 - 6.4      Diabetes: >6.4      Glycemic control for adults with diabetes: <7.0     Mean Plasma Glucose mg/dL 174            Ref Range 150mo46mo  12yr 75yr 69yr a101yr   Cholesterol 0 - 200 mg/dL 132 138R, CM 107R, CM    Triglycerides <150 mg/dL 197 (H) 321.0 (H)R, CM 145.0R, CM    HDL >39 mg/dL 28 (L) 39.00 mg/dL" class="[email or web address]" style="cursor: pointer;" onmouseover='jscript: var varStyle="underline"; var bgColor="#[id number]"; this.style.backgroundColor=bgColor; var children=this.getElementsByTagName("div"); for(var child=0;child 36.20 (L)R 39.00 mg/dL" class="[email or web address]" style="cursor: pointer;" onmouseover='jscript: var varStyle="underline"; var bgColor="#[id number]"; this.style.backgroundColor=bgColor; var children=this.getElementsByTagName("div"); for(var child=0;child 37.10 (L)R     Total CHOL/HDL Ratio RATIO 4.7 4R, CM 3R, CM    VLDL 0 - 40 mg/dL 39 64.2 (H)R 29.0R    LDL Cholesterol 0 - 99 mg/dL 65  41R   Comments:     Total Cholesterol/HDL:CHD Risk  Coronary Heart Disease Risk Table            Men  Women  1/2 Average Risk  3.4  3.3  Average Risk    5.0  4.4  2 X Average Risk  9.6  7.1  3 X Average Risk 23.4  11.0      Use the calculated Patient Ratio  above and the CHD Risk Table  to determine the patient's CHD Risk.      ATP III CLASSIFICATION (LDL):  <100   mg/dL  Optimal  100-129 mg/dL  Near or Above  Optimal  130-159 mg/dL  Borderline  160-189 mg/dL  High  >190   mg/dL  Very High     Resulting Agency  [facility]      Specimen Collected: 10/26/14 5:00 AM Last Resulted: 10/26/14 6:43 AM         Ct Angio Head W/cm &/or Wo Cm  10/25/2014 CLINICAL DATA: RIGHT arm weakness, slurred speech, last seen normal at 1645 hours. History of hypertension, hyperlipidemia, diabetes, stroke. EXAM: CT ANGIOGRAPHY HEAD AND NECK TECHNIQUE: Multidetector CT imaging of the head and neck was performed using the standard protocol during bolus administration of intravenous contrast. Multiplanar CT image reconstructions and MIPs were obtained to evaluate the vascular anatomy. Carotid stenosis measurements (when applicable) are obtained utilizing NASCET criteria, using the distal internal carotid diameter as the denominator. CONTRAST: 45m OMNIPAQUE IOHEXOL 350 MG/ML SOLN COMPARISON: MRI of the brain November 19, 2006 FINDINGS: CT HEAD Brain: Mild to moderately motion degraded examination, particularly degrades evaluation skull base. No intraparenchymal hemorrhage, mass effect, midline shift or acute large vascular territory infarct. LEFT frontoparietal encephalomalacia. Patchy to confluent supratentorial white matter hypodensities.  Moderate to severe ventriculomegaly, likely on the basis of global parenchymal brain volume loss as there is overall commensurate enlargement of cerebral sulci and cerebellar folia. Remote RIGHT cystic basal ganglia and thalamus lacunar infarcts. Basal cisterns are patent. Moderate calcific atherosclerosis of the carotid siphons. Calvarium and skull base: No skull fracture though, assessment limited at the skull base. Patient is edentulous. Paranasal sinuses: Probable maxillary sinusitis, limited by motion. Limited assessment of mastoid air cells. Orbits: Status post bilateral ocular lens implants. CTA NECK Normal appearance of the thoracic arch, normal branch pattern. Mild to moderate calcific atherosclerosis. The origins of the innominate, left Common carotid artery and subclavian artery are widely patent. Bilateral Common carotid arteries are widely patent, coursing in a straight line fashion. Eccentric intimal thickening results in less than 40% stenosis of LEFT internal carotid artery. Mild calcific atherosclerosis of the RIGHT Common carotid artery. 2-3 mm eccentric intimal thickening and calcific atherosclerosis of the carotid bulbs extending to the internal carotid artery origins. Normal appearance of the carotid bifurcations without hemodynamically significant stenosis by NASCET criteria. Approximately 40% stenosis of LEFT internal carotid artery origin. Normal appearance of the included internal carotid arteries. Left vertebral artery is dominant. Atherosclerosis of the origin results in approximately 50% narrowing of RIGHT vertebral artery origin. The vertebral arteries No dissection, no pseudoaneurysm. No abnormal luminal irregularity. No contrast extravasation. 9 mm LEFT thyroid nodule, below size surveillance recommendations. Fatty parotid glands. Patient is edentulous. Bilateral maxillary sinus mucosal thickening with air-fluid levels. No acute osseous process though bone windows have not  been submitted. CTA HEAD Anterior circulation: Normal appearance of the cervical internal carotid arteries, petrous, cavernous and supra clinoid internal carotid arteries. Widely patent anterior communicating artery. Normal appearance of the anterior and middle cerebral arteries. Dolichoectatic intracranial vessels. Posterior circulation: The LEFT vertebral artery is dominant with normal appearance of the vertebral arteries, vertebrobasilar junction and basilar artery, as well as main branch vessels. RIGHT posterior inferior cerebellar artery origin infundibulum. Large, robust bilateral posterior communicating arteries contribute the predominant vascular supply to the posterior cerebral arteries which are widely patent. Dolichoectatic intracranial vessels. No large vessel occlusion, hemodynamically significant stenosis, dissection, luminal irregularity, contrast extravasation or aneurysm within the anterior nor posterior circulation. IMPRESSION: CT HEAD: Mild to moderate motion degraded examination. No acute intracranial process. LEFT frontoparietal encephalomalacia suggest remote LEFT middle cerebral artery  territory infarct. Moderate to severe parenchymal brain volume loss. Moderate to severe white matter changes suggest chronic small vessel ischemic disease. Chronic appearing RIGHT basal ganglia and RIGHT thalamus lacunar infarcts. CTA NECK: Atherosclerosis of the carotid bulbs, with up to 40% stenosis of LEFT internal carotid artery by NASCET criteria. Approximately 50% stenosis of vertebral artery origin. Acute maxillary sinusitis. CTA HEAD: Complete circle of Willis without large vessel occlusion or hemodynamically significant stenosis. Dolichoectatic intracranial vessels suggests sequelae of chronic hypertension. Acute findings discussed with and reconfirmed by Dr.PETER SUMNER on 10/25/2014 at 10:59 pm. Electronically Signed By: Elon Alas On: 10/25/2014 23:00   Ct Angio Neck W/cm  &/or Wo/cm  10/25/2014 CLINICAL DATA: RIGHT arm weakness, slurred speech, last seen normal at 1645 hours. History of hypertension, hyperlipidemia, diabetes, stroke. EXAM: CT ANGIOGRAPHY HEAD AND NECK TECHNIQUE: Multidetector CT imaging of the head and neck was performed using the standard protocol during bolus administration of intravenous contrast. Multiplanar CT image reconstructions and MIPs were obtained to evaluate the vascular anatomy. Carotid stenosis measurements (when applicable) are obtained utilizing NASCET criteria, using the distal internal carotid diameter as the denominator. CONTRAST: 56m OMNIPAQUE IOHEXOL 350 MG/ML SOLN COMPARISON: MRI of the brain November 19, 2006 FINDINGS: CT HEAD Brain: Mild to moderately motion degraded examination, particularly degrades evaluation skull base. No intraparenchymal hemorrhage, mass effect, midline shift or acute large vascular territory infarct. LEFT frontoparietal encephalomalacia. Patchy to confluent supratentorial white matter hypodensities. Moderate to severe ventriculomegaly, likely on the basis of global parenchymal brain volume loss as there is overall commensurate enlargement of cerebral sulci and cerebellar folia. Remote RIGHT cystic basal ganglia and thalamus lacunar infarcts. Basal cisterns are patent. Moderate calcific atherosclerosis of the carotid siphons. Calvarium and skull base: No skull fracture though, assessment limited at the skull base. Patient is edentulous. Paranasal sinuses: Probable maxillary sinusitis, limited by motion. Limited assessment of mastoid air cells. Orbits: Status post bilateral ocular lens implants. CTA NECK Normal appearance of the thoracic arch, normal branch pattern. Mild to moderate calcific atherosclerosis. The origins of the innominate, left Common carotid artery and subclavian artery are widely patent. Bilateral Common carotid arteries are widely patent, coursing in a straight line fashion.  Eccentric intimal thickening results in less than 40% stenosis of LEFT internal carotid artery. Mild calcific atherosclerosis of the RIGHT Common carotid artery. 2-3 mm eccentric intimal thickening and calcific atherosclerosis of the carotid bulbs extending to the internal carotid artery origins. Normal appearance of the carotid bifurcations without hemodynamically significant stenosis by NASCET criteria. Approximately 40% stenosis of LEFT internal carotid artery origin. Normal appearance of the included internal carotid arteries. Left vertebral artery is dominant. Atherosclerosis of the origin results in approximately 50% narrowing of RIGHT vertebral artery origin. The vertebral arteries No dissection, no pseudoaneurysm. No abnormal luminal irregularity. No contrast extravasation. 9 mm LEFT thyroid nodule, below size surveillance recommendations. Fatty parotid glands. Patient is edentulous. Bilateral maxillary sinus mucosal thickening with air-fluid levels. No acute osseous process though bone windows have not been submitted. CTA HEAD Anterior circulation: Normal appearance of the cervical internal carotid arteries, petrous, cavernous and supra clinoid internal carotid arteries. Widely patent anterior communicating artery. Normal appearance of the anterior and middle cerebral arteries. Dolichoectatic intracranial vessels. Posterior circulation: The LEFT vertebral artery is dominant with normal appearance of the vertebral arteries, vertebrobasilar junction and basilar artery, as well as main branch vessels. RIGHT posterior inferior cerebellar artery origin infundibulum. Large, robust bilateral posterior communicating arteries contribute the predominant vascular supply to the posterior cerebral  arteries which are widely patent. Dolichoectatic intracranial vessels. No large vessel occlusion, hemodynamically significant stenosis, dissection, luminal irregularity, contrast extravasation or aneurysm within the  anterior nor posterior circulation. IMPRESSION: CT HEAD: Mild to moderate motion degraded examination. No acute intracranial process. LEFT frontoparietal encephalomalacia suggest remote LEFT middle cerebral artery territory infarct. Moderate to severe parenchymal brain volume loss. Moderate to severe white matter changes suggest chronic small vessel ischemic disease. Chronic appearing RIGHT basal ganglia and RIGHT thalamus lacunar infarcts. CTA NECK: Atherosclerosis of the carotid bulbs, with up to 40% stenosis of LEFT internal carotid artery by NASCET criteria. Approximately 50% stenosis of vertebral artery origin. Acute maxillary sinusitis. CTA HEAD: Complete circle of Willis without large vessel occlusion or hemodynamically significant stenosis. Dolichoectatic intracranial vessels suggests sequelae of chronic hypertension. Acute findings discussed with and reconfirmed by Dr.PETER SUMNER on 10/25/2014 at 10:59 pm. Electronically Signed By: Elon Alas On: 10/25/2014 23:00   Mr Brain Wo Contrast  10/26/2014 CLINICAL DATA: Right-sided weakness with slurred speech which began 10/25/2014. History of hypertension and diabetes. Initial encounter. EXAM: MRI HEAD WITHOUT CONTRAST TECHNIQUE: Multiplanar, multiecho pulse sequences of the brain and surrounding structures were obtained without intravenous contrast. COMPARISON: CT angio head neck 10/25/2014. MRI brain 11/19/2006. FINDINGS: Moderate-sized area of restricted diffusion affects the LEFT posterior frontal cortex and subcortical white matter representing acute infarction. No other areas of restricted diffusion are observed. Generalized atrophy with prominence of the ventricles, cisterns, and sulci. Moderately extensive chronic microvascular ischemic change throughout the periventricular and subcortical white matter. LEFT frontoparietal encephalomalacia is asymmetric, with the patulous LEFT sylvian fissure, likely remote chronic  ischemic insults. Similar chronic lacunar insults are seen in the RIGHT thalamus and RIGHT basal ganglia. Flow voids are maintained. No foci of acute or chronic hemorrhage. Significant BILATERAL maxillary chronic paranasal sinus disease with both mucosal thickening as well as RIGHT maxillary air-fluid level suggesting acuity. No orbital findings. No mastoid fluid. Good general agreement with prior CTA. Compared with the prior MR from 2008, there is significant progression of atrophy/encephalomalacia and small vessel disease. Multiple acute infarcts were also present at that time. IMPRESSION: Acute LEFT MCA territory posterior frontal cortical and subcortical white matter infarction. No hemorrhage or mass effect. Progression of generalized atrophy and small vessel disease with sequelae of multiple prior infarcts which were acute in 2008. No large vessel occlusion is evident. Acute and chronic maxillary sinus disease. Electronically Signed By: Rolla Flatten M.D. On: 10/26/2014 08:48   US Renal  10/26/2014 CLINICAL DATA: Chronic kidney disease EXAM: RENAL/URINARY TRACT ULTRASOUND COMPLETE COMPARISON: None. FINDINGS: Right Kidney: Length: 10.6 cm. No mass or hydronephrosis. Left Kidney: Length: 11.0 cm. No mass or hydronephrosis. Bladder: Within normal limits. IMPRESSION: Negative renal ultrasound. Electronically Signed By: Julian Hy M.D. On: 10/26/2014 08:15   Hospital records reviewed - MRI revealed acute left MCA stroke; 2D echo showed nml LV fxn; carotid doppler showed 40-59% stenosis left ICA and 1-39% right ICA; TEE neg for PFO; venous doppler LE neg  Assessment/Plan    ICD-9-CM ICD-10-CM   1. Cerebral artery occlusion with cerebral infarction - etiology unknown; cont plavix 434.91 I63.9   2. 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 Essential hypertension controlled on toprol xl 401.9 I10   4. HLD (hyperlipidemia) - cont statin and niacin 272.4 E78.5   5. Right hemiparesis due to #1 342.90 G81.90   6. Atherosclerosis  of native coronary artery of native heart without angina pectoris - s/p stent; loop recorder inserted 414.01 I25.10   7. B12 deficiency anemia - cont B12 supplement  --PT/OT/ST as indicated  --maintain tight glycemic control  --f/u with cardiology and neurology as scheduled  --check CBC and CMP in 1 week to follow blood counts and renal fxn  --GOAL: short term rehab and d/c home when medically appropriate. Communicated with pt and nursing.  Hazeline Charnley S. Perlie Gold  [facility] [address] [place], [place] [address] [phone] Office (Wednesdays and Fridays 8 AM - 5 PM) [phone] Cell (Monday-Friday 8 AM - 5 PM)

## 2014-12-15 ENCOUNTER — Ambulatory Visit: Payer: Self-pay | Admitting: Neurology

## 2014-12-15 ENCOUNTER — Ambulatory Visit (INDEPENDENT_AMBULATORY_CARE_PROVIDER_SITE_OTHER): Payer: Medicare Other | Admitting: Neurology

## 2014-12-15 ENCOUNTER — Encounter: Payer: Self-pay | Admitting: Neurology

## 2014-12-15 VITALS — BP 147/78 | HR 83 | Ht 70.0 in | Wt 185.2 lb

## 2014-12-15 DIAGNOSIS — I480 Paroxysmal atrial fibrillation: Secondary | ICD-10-CM | POA: Diagnosis not present

## 2014-12-15 DIAGNOSIS — E1159 Type 2 diabetes mellitus with other circulatory complications: Secondary | ICD-10-CM

## 2014-12-15 DIAGNOSIS — I639 Cerebral infarction, unspecified: Secondary | ICD-10-CM

## 2014-12-15 DIAGNOSIS — N189 Chronic kidney disease, unspecified: Secondary | ICD-10-CM | POA: Insufficient documentation

## 2014-12-15 DIAGNOSIS — I63412 Cerebral infarction due to embolism of left middle cerebral artery: Secondary | ICD-10-CM

## 2014-12-15 DIAGNOSIS — N184 Chronic kidney disease, stage 4 (severe): Secondary | ICD-10-CM | POA: Diagnosis not present

## 2014-12-15 DIAGNOSIS — I10 Essential (primary) hypertension: Secondary | ICD-10-CM

## 2014-12-15 DIAGNOSIS — E1169 Type 2 diabetes mellitus with other specified complication: Secondary | ICD-10-CM | POA: Insufficient documentation

## 2014-12-15 DIAGNOSIS — E785 Hyperlipidemia, unspecified: Secondary | ICD-10-CM

## 2014-12-15 NOTE — Patient Instructions (Signed)
-   continue Xarelto 15mg  and pravastatin for stroke prevention - continue ASA 81 for cardiac prevention - monitoring kidney function, if getting worse, we may have to consider switch Xarelto to coumadin. But at this time, no need to change. - Follow up with your primary care physician for stroke risk factor modification. Recommend maintain blood pressure goal <130/80, diabetes with hemoglobin A1c goal below 6.5% and lipids with LDL cholesterol goal below 70 mg/dL.  - check BP and glucose at home, record and bring over to PCP - continue home excerise but I will refer you to outpt PT/OT in [place].  - follow up in 3 months.

## 2014-12-15 NOTE — Progress Notes (Signed)
STROKE NEUROLOGY FOLLOW UP NOTE  NAME: [name] [name] DOB: 1941/05/05  REASON FOR VISIT: stroke follow up HISTORY FROM: chart and wife  Today we had the pleasure of seeing [name] [name] in follow-up at our Neurology Clinic. Pt was accompanied by wife.   History Summary [name] is a 74 y.o. 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 MRI showed acute left MCA territory cortical and subcortical infarcts, again consistent with embolic stroke. CTA head and neck done showed 40% stenosis on that left ICA, lower stream he DVT negative, TEE was done showed aortic atheroma and no PFO. A1c 7.7, LDL 65. Loop recorder placed, and patient put on Plavix, continued on statin, and discharged to CIR.  Interval History During the interval time, the patient has been doing better. Not long after Loop recorder placement, he was found to have A. fib episode, and he was started on Xarelto for stroke prevention. He was also continued on aspirin 81 mg for cardiac prevention. He was later discharged from ER to home with home health, and currently he has finished off rehabilitation therapy. His BP 147/78, and he said he haven't taken BP meds today yet. He stated that his glucose is better controlled, running around 100.  REVIEW OF SYSTEMS: Full 14 system review of systems performed and notable only for those listed below and in HPI above, all others are negative:  Constitutional:   Cardiovascular:  Ear/Nose/Throat:   Skin:  Eyes:   Respiratory:   Gastroitestinal:   Genitourinary:  Hematology/Lymphatic:   Endocrine:  Musculoskeletal:   Allergy/Immunology:   Neurological:   Psychiatric:  Sleep:   The following represents the patient's  updated allergies and side effects list: No Known Allergies  The neurologically relevant items on the patient's problem list were reviewed on today's visit.  Neurologic Examination  A problem focused neurological exam (12 or more points of the single system neurologic examination, vital signs counts as 1 point, cranial nerves count for 8 points) was performed.  Blood pressure 147/78, pulse 83, height 5\' 10"  (1.778 m), weight 185 lb 3.2 oz (84.006 kg).  General - Well nourished, well developed, in no apparent distress.  Ophthalmologic - Sharp disc margins OU.  Cardiovascular - Regular rate and rhythm.  Mental Status -  Level of arousal and orientation to time, place, and person were intact. Language including expression, naming, repetition, comprehension was assessed and found intact.  Cranial Nerves II - XII - II - Visual field intact OU. III, IV, VI - Extraocular movements intact. V - Facial sensation intact bilaterally. VII - Facial movement intact bilaterally. VIII - Hearing & vestibular intact bilaterally. X - Palate elevates symmetrically. XI - Chin turning & shoulder shrug intact bilaterally. XII - Tongue protrusion intact.  Motor Strength - The patient's strength was 5-/5 deltoid, 4/5 bicep and tricep, 3/5 distally on the RUE, mild right shoulder tenderness on palpation but otherwise 5/5 in all extremities and pronator drift was present on the left.  Bulk was normal and fasciculations were absent.   Motor Tone - Muscle tone was assessed at the neck and appendages and was normal.  Reflexes - The patient's reflexes were normal in all extremities and he had no pathological reflexes.  Sensory - Light touch, temperature/pinprick were assessed and were normal.    Coordination - The patient had normal movements in the hands and feet with no ataxia or dysmetria.  Tremor was absent.  Gait and Station - The patient's transfers, posture, gait, station, and turns were observed as  normal, but decreased right arm swing.  Data reviewed: I personally reviewed the images and agree with the radiology interpretations.  Ct Angio Head W/cm &/or Wo Cm 10/25/2014  Mild to moderate motion degraded examination. No acute intracranial process. LEFT frontoparietal encephalomalacia suggest remote LEFT middle cerebral artery territory infarct. Moderate to severe parenchymal brain volume loss. Moderate to severe white matter changes suggest chronic small vessel ischemic disease. Chronic appearing RIGHT basal ganglia and RIGHT thalamus lacunar infarcts.  Atherosclerosis of the carotid bulbs, with up to 40% stenosis of LEFT internal carotid artery by NASCET criteria. Approximately 50% stenosis of vertebral artery origin. Acute maxillary sinusitis.   CTA HEAD:  Complete circle of Willis without large vessel occlusion or hemodynamically significant stenosis. Dolichoectatic intracranial vessels suggests sequelae of chronic hypertension.   MRI of the brain without contrast 10/26/2014 Acute LEFT MCA territory posterior frontal cortical and subcortical white matter infarction.  No hemorrhage or mass effect. Progression of generalized atrophy and small vessel disease with sequelae of multiple prior infarcts which were acute in 2008. No large vessel occlusion is evident. Acute and chronic maxillary sinus disease.  Renal ultrasound 10/26/2014 Negative renal ultrasound.  Bilateral lower extremity venous duplex Study was technically difficult due to poor patient cooperation and posterior acoustic shadowing from lower extremity arteries. Visualized veins of bilateral lower extremities which were able to be compressed are negative for deep vein thrombosis. Unable to perform compression maneuvers on some segments due to poor patient cooperation and guarding. There is no evidence of Baker's cyst bilaterally.  CUS Findings suggest 1-39% right internal carotid artery stenosis and upper  range 1-39% versus low range 40-59% stenosis of the left internal carotid artery. Vertebral arteries are patent with antegrade flow.  2D echo Limited, poor quality study. Probably normal overall LV function. Study is not adequate for wall motion analysis. No overt valvular abnormalities. Normal right ventricular size and function. No pericardial effusion. Impaired relaxation pattern of left ventricular filling. Normal mean left atrial pressure.  TEE  10/27/2014 Left Ventrical: Normal size and function, no WMA. Mitral Valve: Mild MR. Aortic Valve: Trace AI. Tricuspid Valve: Normal. Pulmonic Valve: Norma;. Left Atrium/ Left atrial appendage: No thrombus, normal emptying velicities. Atrial septum: No PFO.  Aorta: Severe non-mobile plague.  Assessment: As you may recall, he is a 74 y.o. 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 CTA head and neck done showed 40% stenosis on the left ICA, TEE was done showed aortic atheroma and no PFO. A1c 7.7, LDL 65. Loop recorder placed, and patient put on Plavix, continued on statin, and discharged to CIR. Loop recorder detected A. fib episode, patient was put on Xarelto and baby aspirin. According to his creatinine clearance at about 25, he was put on Xarelto 15 mg daily. Right sided weakness much improved. Patient also follows with Dr. Stanford Breed in cardiology for carotid stenosis.  Plan:  - continue  Xarelto 15mg  and pravastatin for stroke prevention - continue ASA 81 for cardiac prevention - monitoring kidney function, if getting worse, we may have to consider switch Xarelto to coumadin. - Continue to follow-up with Dr. Stanford Breed in  cardiology for carotid stenosis.  - Follow up with primary care physician for stroke risk factor modification. Recommend maintain blood pressure goal <130/80, diabetes with hemoglobin A1c goal below 6.5% and lipids with LDL cholesterol goal below 70 mg/dL.  - check BP and glucose at home, record and bring over to PCP. - Refer to outpt PT/OT in [place].  - RTC in 3 months.  Orders Placed This Encounter  Procedures  . Ambulatory referral to Physical Therapy    Referral Priority:  Routine    Referral Type:  Physical Medicine    Referral Reason:  Specialty Services Required    Requested Specialty:  Physical Therapy    Number of Visits Requested:  1  . Ambulatory referral to Occupational Therapy    Referral Priority:  Routine    Referral Type:  Occupational Therapy    Referral Reason:  Specialty Services Required    Requested Specialty:  Occupational Therapy    Number of Visits Requested:  1    Meds ordered this encounter  Medications  . furosemide (LASIX) 20 MG tablet    Sig: Take 20 mg by mouth.    Patient Instructions  - continue Xarelto 15mg  and pravastatin for stroke prevention - continue ASA 81 for cardiac prevention - monitoring kidney function, if getting worse, we may have to consider switch Xarelto to coumadin. But at this time, no need to change. - Follow up with your primary care physician for stroke risk factor modification. Recommend maintain blood pressure goal <130/80, diabetes with hemoglobin A1c goal below 6.5% and lipids with LDL cholesterol goal below 70 mg/dL.  - check BP and glucose at home, record and bring over to PCP - continue home excerise but I will refer you to outpt PT/OT in [place].  - follow up in 3 months.   Rosalin Hawking, MD PhD [facility] [address], [place], [place] [address] [phone]

## 2014-12-20 LAB — MDC_IDC_ENUM_SESS_TYPE_REMOTE

## 2014-12-26 ENCOUNTER — Ambulatory Visit (INDEPENDENT_AMBULATORY_CARE_PROVIDER_SITE_OTHER): Payer: Medicare Other | Admitting: *Deleted

## 2014-12-26 DIAGNOSIS — I639 Cerebral infarction, unspecified: Secondary | ICD-10-CM | POA: Diagnosis not present

## 2014-12-26 DIAGNOSIS — I6350 Cerebral infarction due to unspecified occlusion or stenosis of unspecified cerebral artery: Secondary | ICD-10-CM

## 2014-12-27 NOTE — Progress Notes (Signed)
Loop recorder 

## 2015-01-01 ENCOUNTER — Ambulatory Visit: Payer: Medicare Other | Attending: Neurology | Admitting: Physical Therapy

## 2015-01-01 VITALS — BP 132/68

## 2015-01-01 DIAGNOSIS — R531 Weakness: Secondary | ICD-10-CM | POA: Diagnosis not present

## 2015-01-01 DIAGNOSIS — IMO0002 Reserved for concepts with insufficient information to code with codable children: Secondary | ICD-10-CM

## 2015-01-01 DIAGNOSIS — I69898 Other sequelae of other cerebrovascular disease: Secondary | ICD-10-CM | POA: Insufficient documentation

## 2015-01-01 DIAGNOSIS — R270 Ataxia, unspecified: Secondary | ICD-10-CM

## 2015-01-01 NOTE — Therapy (Signed)
[facility] [place], [place], [address] Phone: [phone]   Fax:  [phone]  Physical Therapy Treatment  Patient Details  Name: [name] [name] MRN: [id number] Date of Birth: August 19, 1941 Referring Provider:  Rosalin Hawking, MD  Encounter Date: 01/01/2015      PT End of Session - 01/01/15 1504    Visit Number 1   Number of Visits 18   Date for PT Re-Evaluation 02/26/15   PT Start Time 0148   PT Stop Time 0230   PT Time Calculation (min) 42 min   Activity Tolerance Patient tolerated treatment well   Behavior During Therapy [facility] for tasks assessed/performed      Past Medical History  Diagnosis Date  . SUPRAVENTRICULAR TACHYCARDIA   . MYOCARDIAL INFARCTION     x 2  . HYPERTENSION   . HYPERLIPIDEMIA   . CEREBROVASCULAR DISEASE   . CEREBROVASCULAR ACCIDENT   . CAD   . RENAL INSUFFICIENCY   . DIABETES MELLITUS   . Cancer     skin    Past Surgical History  Procedure Laterality Date  . Coronary stent placement  2002, 2003    x 4  . Loop recorder implant N/A 10/27/2014    Procedure: LOOP RECORDER IMPLANT;  Surgeon: Deboraha Sprang, MD;  Location: [facility] CATH LAB;  Service: Cardiovascular;  Laterality: N/A;  . Tee without cardioversion N/A 10/27/2014    Procedure: TRANSESOPHAGEAL ECHOCARDIOGRAM (TEE);  Surgeon: Dorothy Spark, MD;  Location: [facility] ENDOSCOPY;  Service: Cardiovascular;  Laterality: N/A;    Filed Vitals:   01/01/15 1402  BP: 132/68  SpO2: 97%    Visit Diagnosis:  Ataxia - Plan: PT plan of care cert/re-cert  Weakness due to cerebrovascular accident - Plan: PT plan of care cert/re-cert      Subjective Assessment - 01/01/15 1428    Subjective Really want to get more use out of my right hand.   Limitations Walking   How long can you walk comfortably? Patient has a staggered gait.  Told him with wife present to use a cane at all times for safety.  I states he has not fallen.            [facility] PT  Assessment - 01/01/15 0001    Assessment   Medical Diagnosis Stroke   Onset Date --  10/31/14.   Precautions   Precautions Fall  Please monitor BP and 02 sat.   Precaution Comments Recommended patient use a cane at all times for safety.   Balance Screen   Has the patient fallen in the past 6 months No   Has the patient had a decrease in activity level because of a fear of falling?  No   Is the patient reluctant to leave their home because of a fear of falling?  No   Coordination   Finger Nose Finger Test --  Minimal Impairment.   ROM / Strength   AROM / PROM / Strength AROM;Strength   AROM   Overall AROM Comments Bilateral active antigravity AROM is equal.  Rigth LE ROM is WNL.   Strength   Overall Strength Comments Right grip= 2# and left= 75#.  Patients weakness in right UE [place] distal.  Right LE 4/5 for major muscle groups.   Special Tests    Special Tests --  Negative Romberg test.   Ambulation/Gait   Ambulation/Gait Yes   Ambulation/Gait Assistance 5: Supervision   Gait Pattern Step-to pattern;Decreased step length - right;Ataxic  PT Short Term Goals - Jan 13, 2015 1507    PT SHORT TERM GOAL #1   Title Ind with initial HEP.   Time 2   Period Weeks   Status New           PT Long Term Goals - 01-13-2015 1507    PT LONG TERM GOAL #1   Title Ind with advanced HEP.   Time 8   Period Weeks   Status New   PT LONG TERM GOAL #2   Title Right grip increased to 35-40#.   Time 8   Period Weeks   Status New   PT LONG TERM GOAL #3   Title Patient perform 20 minutes of aerobic activiity with only one rest.   Time 8   Period Weeks   Status New   PT LONG TERM GOAL #4   Title restore normal right UE proprioception.   Time 8   Period Weeks   Status New   PT LONG TERM GOAL #5   Title Patient walk in clinic 500 feet without ataxia and supervision only.   Time 8   Period Weeks   Status New               Plan -  01/13/2015 1356    Clinical Impression Statement On March 8th, 2016 was seitting in chair and just could not move.  Called [phone].  8 day hospital stay and approximately 10 day inpatient stay for rehab. CC is weakness in right UE and hand.  When I lie down on back my right shoulder hurts alot.   Pt will benefit from skilled therapeutic intervention in order to improve on the following deficits Decreased activity tolerance;Abnormal gait;Decreased coordination;Decreased endurance;Decreased balance;Decreased strength   Rehab Potential Good   PT Frequency 3x / week   PT Duration 8 weeks   PT Treatment/Interventions ADLs/Self Care Home Management;Patient/family education;Therapeutic exercise;Balance training;Neuromuscular re-education   PT Next Visit Plan Right gripping exercises.  Right UE ther ex to increase strength and proprioception.  Right LE exercise; gait and balance activites.   Consulted and Agree with Plan of Care Patient          G-Codes - 01-13-15 1429    Functional Assessment Tool Used Clinical judgement.   Functional Limitation Mobility: Walking and moving around   Mobility: Walking and Moving Around Current Status [phone]) At least 60 percent but less than 80 percent impaired, limited or restricted   Mobility: Walking and Moving Around Goal Status [phone]) At least 20 percent but less than 40 percent impaired, limited or restricted      Problem List Patient Active Problem List   Diagnosis Date Noted  . Cerebral infarction due to embolism of left middle cerebral artery 12/15/2014  . Paroxysmal atrial fibrillation 12/15/2014  . CKD (chronic kidney disease) 12/15/2014  . Hyperlipidemia 12/15/2014  . Type 2 diabetes mellitus with other circulatory complications 77/41/2878  . B12 deficiency anemia 12/10/2014  . Right hemiparesis 10/26/2014  . Diabetes mellitus 10/26/2014  . Essential hypertension   . HLD (hyperlipidemia)   . Stroke 10/25/2014  . CEREBROVASCULAR DISEASE 01/12/10   . MURMUR 12/12/2008  . MYOCARDIAL INFARCTION 12/11/2008  . ANGINA, UNSTABLE 12/11/2008  . Coronary atherosclerosis 12/11/2008  . SUPRAVENTRICULAR TACHYCARDIA 12/11/2008  . Cerebral artery occlusion with cerebral infarction 12/11/2008  . Disorder resulting from impaired renal function 12/11/2008  . CAROTID BRUIT 12/11/2008  . HYPERGLYCEMIA 12/11/2008    Sidni Fusco, [place] MPT 2015/01/13, 3:13 PM  [facility]  [place], [place], [address] Phone: [phone]   Fax:  [phone]

## 2015-01-02 NOTE — Progress Notes (Signed)
Patient ID: [name] [name], male   DOB: [birth year], 74 y.o.   MRN: [id number]  [name]      No Known Allergies     Chief Complaint  Patient presents with  . Hospitalization Follow-up    HPI:  He has been hospitalized with an acute mca cva. He is here for short term rehab with his goal to return back home. He does have right hemiparesis. He is not voicing any concerns today. There are no nursing concerns today.    Past Medical History  Diagnosis Date  . SUPRAVENTRICULAR TACHYCARDIA   . MYOCARDIAL INFARCTION     x 2  . HYPERTENSION   . HYPERLIPIDEMIA   . CEREBROVASCULAR DISEASE   . CEREBROVASCULAR ACCIDENT   . CAD   . RENAL INSUFFICIENCY   . DIABETES MELLITUS   . Cancer     skin    Past Surgical History  Procedure Laterality Date  . Coronary stent placement  2002, 2003    x 4  . Loop recorder implant N/A 10/27/2014    Procedure: LOOP RECORDER IMPLANT;  Surgeon: Deboraha Sprang, MD;  Location: [facility] CATH LAB;  Service: Cardiovascular;  Laterality: N/A;  . Tee without cardioversion N/A 10/27/2014    Procedure: TRANSESOPHAGEAL ECHOCARDIOGRAM (TEE);  Surgeon: Dorothy Spark, MD;  Location: [facility];  Service: Cardiovascular;  Laterality: N/A;    VITAL SIGNS BP 127/79 mmHg  Pulse 80  Ht 5\' 6"  (1.676 m)  Wt 181 lb (82.101 kg)  BMI 29.23 kg/m2   Outpatient Encounter Prescriptions as of 11/03/2014  Medication Sig  plavix 75 mg daily  Vit b12 100 mcg daily  Glipizide 5 mg daily  lantus 30 units nightly niaspan er 1gm daily  novolog 3 units with meals pravachol 80 mg daily  Senna s nighlty  toprol xl 50 mg daily      SIGNIFICANT DIAGNOSTIC EXAMS  10-25-14: ct angio of head and neck: CT HEAD:  Mild to moderate motion degraded examination.No acute intracranial process. LEFT frontoparietal encephalomalacia suggest remote LEFT middle cerebral artery territory infarct. Moderate to severe parenchymal brain volume loss. Moderate to severe white matter  changes suggest chronic small vessel ischemic disease. Chronic appearing RIGHT basal ganglia and RIGHT thalamus lacunar infarcts. CTA NECK: Atherosclerosis of the carotid bulbs, with up to 40% stenosis of LEFT internal carotid artery by NASCET criteria. Approximately 50% stenosis of vertebral artery origin. Acute maxillary sinusitis. CTA HEAD: Complete circle of Willis without large vessel occlusion or hemodynamically significant stenosis. Dolichoectatic intracranial vessels suggests sequelae of chronic hypertension.  10-26-14: renal ultrasound: Negative renal ultrasound  10-26-14 mri of brain: Acute LEFT MCA territory posterior frontal cortical and subcortical white matter infarction. No hemorrhage or mass effect. Progression of generalized atrophy and small vessel disease with sequelae of multiple prior infarcts which were acute in 2008. No large vessel occlusion is evident. Acute and chronic maxillary sinus disease.  10-26-14: bilateral lower extremity doppler: Visualized veins of bilateral lower extremities which were able to be compressed are negative for deep vein thrombosis. Unable to perform compression maneuvers on some segments due to poor patient cooperation and guarding. There is no evidence of Baker&'s cyst bilaterally.  10-27-14: TEE: Left ventricle: Systolic function was normal. The estimated ejection fraction was in the range of 60% to 65%. Wall motion was normal; there were no regional wall motion abnormalities. - Aortic valve: No evidence of vegetation. There was mild regurgitation. 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 - Mitral valve: There was  mild regurgitation. - Left atrium: No evidence of thrombus in the atrial cavity or appendage. No evidence of thrombus in the atrial cavity or appendage. - Right atrium: No evidence of thrombus in the atrial cavity or appendage. - Atrial septum: There was increased thickness of the septum, consistent with lipomatous hypertrophy. No defect or  patent foramen ovale was identified.    LABS REVIEWED:   3-2-216: wbc 10.0; hgb 11.8; hct 36.1; mcv 88; plt 263; glucose 302; bun 41; creat 3.03; k+4.3; na++138; liver normal albumin 2.9 10-26-14: glucose 107; bun 36; creat 2.74; k+3.8; na++141; chol 132; ldl 65; trig 197; hdl 28; hgb a1c 7.7 10-29-14: glucose 87; bun 37; creat 3.08; k+4.0; na++138; liver normal albumin 3.0            Review of Systems  Constitutional: Negative for malaise/fatigue.  Respiratory: Negative for cough and shortness of breath.   Cardiovascular: Positive for palpitations. Negative for chest pain.  Gastrointestinal: Negative for abdominal pain and constipation.  Musculoskeletal: Negative for myalgias and joint pain.  Skin: Negative.   Neurological: Positive for weakness.  Psychiatric/Behavioral: The patient is not nervous/anxious.      Physical Exam  Constitutional: He appears well-developed and well-nourished. No distress.  Neck: Neck supple. No JVD present. No thyromegaly present.  Cardiovascular: Normal rate, regular rhythm and intact distal pulses.   Murmur heard. Respiratory: Effort normal and breath sounds normal. No respiratory distress.  GI: Soft. Bowel sounds are normal. He exhibits no distension. There is no tenderness.  Musculoskeletal: He exhibits no edema.  Has right hemiplegia   Neurological: He is alert.  Skin: Skin is warm and dry. He is not diaphoretic.       ASSESSMENT/ PLAN:  1. Diabetes: will continue glipizide 5 mg daily; lantus 30 units nightly; novolog 3 units with meals and will monitor his hgb a1c is 7.7   2. Hypertension: will continue toprol xl 50 mg daily   3. Dyslipidemia: will continue pravachol 80 mg daily; niaspan er 1 gm daily his ldl is 65; trig 197  4. Constipation: will continue senna s nightly   5. CVA: right hemiparesis: will continue plavix 75 mg daily; will continue therapy as  Directed to improve upon gait; balance and adl retraining  6. CAD: no  complaint of chest pain will continue plavix 75 mg daily; is status post loop recorder for svt.   Will check cbc; cmp   Time spent with patient 50 minutes.     Ok Edwards NP [facility]  Contact [phone] Monday through Friday 8am- 5pm  After hours call [phone]

## 2015-01-04 ENCOUNTER — Encounter: Payer: Self-pay | Admitting: Cardiology

## 2015-01-05 ENCOUNTER — Ambulatory Visit: Payer: Medicare Other | Admitting: Physical Therapy

## 2015-01-05 ENCOUNTER — Encounter: Payer: Self-pay | Admitting: Physical Therapy

## 2015-01-05 DIAGNOSIS — IMO0002 Reserved for concepts with insufficient information to code with codable children: Secondary | ICD-10-CM

## 2015-01-05 DIAGNOSIS — R270 Ataxia, unspecified: Secondary | ICD-10-CM

## 2015-01-05 NOTE — Therapy (Signed)
[facility] [place], [place], [address] Phone: [phone]   Fax:  [phone]  Physical Therapy Treatment  Patient Details  Name: [name] [name] MRN: [id number] Date of Birth: [birth year] Referring Provider:  Stephens Shire, MD  Encounter Date: 01/05/2015      PT End of Session - 01/05/15 1119    Visit Number 2   Number of Visits 18   Date for PT Re-Evaluation 02/26/15   PT Start Time 1117   PT Stop Time 1202   PT Time Calculation (min) 45 min   Activity Tolerance Patient tolerated treatment well   Behavior During Therapy [facility] for tasks assessed/performed      Past Medical History  Diagnosis Date  . SUPRAVENTRICULAR TACHYCARDIA   . MYOCARDIAL INFARCTION     x 2  . HYPERTENSION   . HYPERLIPIDEMIA   . CEREBROVASCULAR DISEASE   . CEREBROVASCULAR ACCIDENT   . CAD   . RENAL INSUFFICIENCY   . DIABETES MELLITUS   . Cancer     skin    Past Surgical History  Procedure Laterality Date  . Coronary stent placement  2002, 2003    x 4  . Loop recorder implant N/A 10/27/2014    Procedure: LOOP RECORDER IMPLANT;  Surgeon: Deboraha Sprang, MD;  Location: [facility] CATH LAB;  Service: Cardiovascular;  Laterality: N/A;  . Tee without cardioversion N/A 10/27/2014    Procedure: TRANSESOPHAGEAL ECHOCARDIOGRAM (TEE);  Surgeon: Dorothy Spark, MD;  Location: [place];  Service: Cardiovascular;  Laterality: N/A;    There were no vitals filed for this visit.  Visit Diagnosis:  Ataxia  Weakness due to cerebrovascular accident      Subjective Assessment - 01/05/15 1121    Subjective States that the R side along his R shoulder blade hurts him when he wakes up   Limitations Walking            [facility] PT Assessment - 01/05/15 0001    Assessment   Medical Diagnosis Stroke                     [facility] Adult PT Treatment/Exercise - 01/05/15 0001    Exercises   Exercises Hand;Knee/Hip   Knee/Hip Exercises: Aerobic    Stationary Bike NuStep L4 x12 min   Knee/Hip Exercises: Standing   Forward Step Up Both;3 sets;10 reps;Hand Hold: 1;Step Height: 4"   Rocker Board 4 minutes   Other Standing Knee Exercises DLS on airex x3 min with no HHA   Knee/Hip Exercises: Seated   Long Arc Quad AROM;Both;3 sets;10 reps   Hand Exercises   Other Hand Exercises Towel pinch, towel grip, red web grip, ball squeeze, R wrist curl 1# with towel for added grip x30 reps                  PT Short Term Goals - 01/05/15 1208    PT SHORT TERM GOAL #1   Title Ind with initial HEP.   Time 2   Period Weeks   Status On-going           PT Long Term Goals - 01/05/15 1208    PT LONG TERM GOAL #1   Title Ind with advanced HEP.   Time 8   Period Weeks   Status On-going   PT LONG TERM GOAL #2   Title Right grip increased to 35-40#.   Time 8   Period Weeks   Status On-going   PT  LONG TERM GOAL #3   Title Patient perform 20 minutes of aerobic activiity with only one rest.   Time 8   Period Weeks   Status On-going   PT LONG TERM GOAL #4   Title restore normal right UE proprioception.   Time 8   Period Weeks   Status On-going   PT LONG TERM GOAL #5   Title Patient walk in clinic 500 feet without ataxia and supervision only.   Time 8   Period Weeks   Status On-going               Plan - 01/05/15 1210    Clinical Impression Statement Patient tolerated treatment well today and is in good spirits regarding therapy. Demonstrates R hand weakness regarding grip or pinch mechanisms used for daily activites. Also demonstrated decreased R hip flexion in order to complete step up using RLE. Denied pain following treatment.   Pt will benefit from skilled therapeutic intervention in order to improve on the following deficits Decreased activity tolerance;Abnormal gait;Decreased coordination;Decreased endurance;Decreased balance;Decreased strength   Rehab Potential Good   PT Frequency 3x / week   PT Duration 8  weeks   PT Treatment/Interventions ADLs/Self Care Home Management;Patient/family education;Therapeutic exercise;Balance training;Neuromuscular re-education   PT Next Visit Plan Continue per PT POC. Consider RUE proprioception exercises next treatment.   Consulted and Agree with Plan of Care Patient        Problem List Patient Active Problem List   Diagnosis Date Noted  . Cerebral infarction due to embolism of left middle cerebral artery 12/15/2014  . Paroxysmal atrial fibrillation 12/15/2014  . CKD (chronic kidney disease) 12/15/2014  . Hyperlipidemia 12/15/2014  . Type 2 diabetes mellitus with other circulatory complications 50/27/7412  . B12 deficiency anemia 12/10/2014  . Right hemiparesis 10/26/2014  . Diabetes mellitus 10/26/2014  . Essential hypertension   . HLD (hyperlipidemia)   . Stroke 10/25/2014  . CEREBROVASCULAR DISEASE 12/31/2009  . MURMUR 12/12/2008  . MYOCARDIAL INFARCTION 12/11/2008  . ANGINA, UNSTABLE 12/11/2008  . Coronary atherosclerosis 12/11/2008  . SUPRAVENTRICULAR TACHYCARDIA 12/11/2008  . Cerebral artery occlusion with cerebral infarction 12/11/2008  . Disorder resulting from impaired renal function 12/11/2008  . CAROTID BRUIT 12/11/2008  . HYPERGLYCEMIA 12/11/2008    Wynelle Fanny, PTA 01/05/2015, 12:14 PM  [facility] [address] [place], [place], [address] Phone: [phone]   Fax:  [phone]

## 2015-01-09 ENCOUNTER — Encounter: Payer: Self-pay | Admitting: Physical Therapy

## 2015-01-09 ENCOUNTER — Ambulatory Visit: Payer: Medicare Other | Admitting: Physical Therapy

## 2015-01-09 DIAGNOSIS — R270 Ataxia, unspecified: Secondary | ICD-10-CM

## 2015-01-09 DIAGNOSIS — IMO0002 Reserved for concepts with insufficient information to code with codable children: Secondary | ICD-10-CM

## 2015-01-09 NOTE — Therapy (Signed)
[facility] [place], [place], [address] Phone: [phone]   Fax:  [phone]  Physical Therapy Treatment  Patient Details  Name: [name] [name] MRN: [id number] Date of Birth: October 30, 1940 Referring Provider:  Stephens Shire, MD  Encounter Date: 01/09/2015      PT End of Session - 01/09/15 1244    Visit Number 3   Number of Visits 18   Date for PT Re-Evaluation 02/26/15   PT Start Time 7867   PT Stop Time 1245   PT Time Calculation (min) 44 min   Activity Tolerance Patient tolerated treatment well   Behavior During Therapy [facility] for tasks assessed/performed      Past Medical History  Diagnosis Date  . SUPRAVENTRICULAR TACHYCARDIA   . MYOCARDIAL INFARCTION     x 2  . HYPERTENSION   . HYPERLIPIDEMIA   . CEREBROVASCULAR DISEASE   . CEREBROVASCULAR ACCIDENT   . CAD   . RENAL INSUFFICIENCY   . DIABETES MELLITUS   . Cancer     skin    Past Surgical History  Procedure Laterality Date  . Coronary stent placement  2002, 2003    x 4  . Loop recorder implant N/A 10/27/2014    Procedure: LOOP RECORDER IMPLANT;  Surgeon: Deboraha Sprang, MD;  Location: [facility] CATH LAB;  Service: Cardiovascular;  Laterality: N/A;  . Tee without cardioversion N/A 10/27/2014    Procedure: TRANSESOPHAGEAL ECHOCARDIOGRAM (TEE);  Surgeon: Dorothy Spark, MD;  Location: [place];  Service: Cardiovascular;  Laterality: N/A;    There were no vitals filed for this visit.  Visit Diagnosis:  Ataxia  Weakness due to cerebrovascular accident      Subjective Assessment - 01/09/15 1205    Subjective did good after last treatment   Limitations Walking   Currently in Pain? No/denies            [facility] PT Assessment - 01/09/15 0001    Strength   Overall Strength Deficits   Overall Strength Comments right grip 5#                     [facility] Adult PT Treatment/Exercise - 01/09/15 0001    Knee/Hip Exercises: Aerobic   Stationary  Bike Nustep L4 x 28min   Knee/Hip Exercises: Standing   Forward Step Up Both;2 sets;10 reps;Step Height: 6"   Rocker Board 4 minutes   Hand Exercises   Other Hand Exercises Red web squeeze, red digi flex(1.4) gripping, red flexbar for wrist flex/ext and bending/squeezeing   Other Hand Exercises Right hand weight bearing on table while left hand rolling ballon table 2 x 76min each, wall push up x7, wall circles with right UE 2 x10                  PT Short Term Goals - 01/09/15 1248    PT SHORT TERM GOAL #1   Title Ind with initial HEP.   Time 2   Period Weeks   Status On-going           PT Long Term Goals - 01/09/15 1248    PT LONG TERM GOAL #1   Title Ind with advanced HEP.   Time 8   Period Weeks   Status On-going   PT LONG TERM GOAL #2   Title Right grip increased to 35-40#.   Time 8   Period Weeks   Status On-going  5#   PT LONG TERM GOAL #3  Title Patient perform 20 minutes of aerobic activiity with only one rest.   Time 8   Period Weeks   Status On-going   PT LONG TERM GOAL #4   Title restore normal right UE proprioception.   Time 8   Period Weeks   Status On-going   PT LONG TERM GOAL #5   Title Patient walk in clinic 500 feet without ataxia and supervision only.   Time 8   Period Weeks   Status On-going               Plan - 01/09/15 1245    Clinical Impression Statement patient tolerated treatment very well with LE strengthening and right UE grip strength and propriocetion activities. has improved grip strength 5# today. golas ongoing due to muscle weakness and limitations.   Pt will benefit from skilled therapeutic intervention in order to improve on the following deficits Decreased activity tolerance;Abnormal gait;Decreased coordination;Decreased endurance;Decreased balance;Decreased strength   Rehab Potential Good   PT Frequency 3x / week   PT Duration 8 weeks   PT Treatment/Interventions ADLs/Self Care Home  Management;Patient/family education;Therapeutic exercise;Balance training;Neuromuscular re-education   PT Next Visit Plan Continue per PT POC with LE strength and  Rt UE proprioception/strength exercises next treatment.   PT Home Exercise Plan issue initial HEP   Consulted and Agree with Plan of Care Patient        Problem List Patient Active Problem List   Diagnosis Date Noted  . Cerebral infarction due to embolism of left middle cerebral artery 12/15/2014  . Paroxysmal atrial fibrillation 12/15/2014  . CKD (chronic kidney disease) 12/15/2014  . Hyperlipidemia 12/15/2014  . Type 2 diabetes mellitus with other circulatory complications 27/78/2423  . B12 deficiency anemia 12/10/2014  . Right hemiparesis 10/26/2014  . Diabetes mellitus 10/26/2014  . Essential hypertension   . HLD (hyperlipidemia)   . Stroke 10/25/2014  . CEREBROVASCULAR DISEASE 12/31/2009  . MURMUR 12/12/2008  . MYOCARDIAL INFARCTION 12/11/2008  . ANGINA, UNSTABLE 12/11/2008  . Coronary atherosclerosis 12/11/2008  . SUPRAVENTRICULAR TACHYCARDIA 12/11/2008  . Cerebral artery occlusion with cerebral infarction 12/11/2008  . Disorder resulting from impaired renal function 12/11/2008  . CAROTID BRUIT 12/11/2008  . HYPERGLYCEMIA 12/11/2008    Randeep Biondolillo P, PTA 01/09/2015, 12:53 PM  [facility] [address] [place], [place], [address] Phone: [phone]   Fax:  [phone]

## 2015-01-10 LAB — CUP PACEART REMOTE DEVICE CHECK: Date Time Interrogation Session: 20160518135859

## 2015-01-11 ENCOUNTER — Encounter: Payer: Self-pay | Admitting: Cardiology

## 2015-01-11 ENCOUNTER — Ambulatory Visit: Payer: Medicare Other | Admitting: Physical Therapy

## 2015-01-11 VITALS — BP 126/65

## 2015-01-11 DIAGNOSIS — R270 Ataxia, unspecified: Secondary | ICD-10-CM

## 2015-01-11 DIAGNOSIS — IMO0002 Reserved for concepts with insufficient information to code with codable children: Secondary | ICD-10-CM

## 2015-01-11 NOTE — Therapy (Signed)
[facility] [place], [place], [address] Phone: [phone]   Fax:  [phone]  Physical Therapy Treatment  Patient Details  Name: [name] [name] MRN: [id number] Date of Birth: 07-Nov-1940 Referring Provider:  Stephens Shire, MD  Encounter Date: 01/11/2015      PT End of Session - 01/11/15 1540    Visit Number 4   Number of Visits 18   Date for PT Re-Evaluation 02/26/15   PT Start Time 0315   PT Stop Time 0402   PT Time Calculation (min) 47 min   Activity Tolerance Patient tolerated treatment well   Behavior During Therapy [facility] for tasks assessed/performed      Past Medical History  Diagnosis Date  . SUPRAVENTRICULAR TACHYCARDIA   . MYOCARDIAL INFARCTION     x 2  . HYPERTENSION   . HYPERLIPIDEMIA   . CEREBROVASCULAR DISEASE   . CEREBROVASCULAR ACCIDENT   . CAD   . RENAL INSUFFICIENCY   . DIABETES MELLITUS   . Cancer     skin    Past Surgical History  Procedure Laterality Date  . Coronary stent placement  2002, 2003    x 4  . Loop recorder implant N/A 10/27/2014    Procedure: LOOP RECORDER IMPLANT;  Surgeon: [name] Sprang, MD;  Location: [facility] CATH LAB;  Service: Cardiovascular;  Laterality: N/A;  . Tee without cardioversion N/A 10/27/2014    Procedure: TRANSESOPHAGEAL ECHOCARDIOGRAM (TEE);  Surgeon: [name] Spark, MD;  Location: [facility] ENDOSCOPY;  Service: Cardiovascular;  Laterality: N/A;    Filed Vitals:   01/11/15 1540  BP: 126/65  SpO2: 97%    Visit Diagnosis:  Ataxia  Weakness due to cerebrovascular accident      Subjective Assessment - 01/11/15 1540    Subjective I'm pleased with how I'm doing so far.                                     PT Short Term Goals - 01/09/15 1248    PT SHORT TERM GOAL #1   Title Ind with initial HEP.   Time 2   Period Weeks   Status On-going           PT Long Term Goals - 01/09/15 1248    PT LONG TERM GOAL #1   Title Ind with  advanced HEP.   Time 8   Period Weeks   Status On-going   PT LONG TERM GOAL #2   Title Right grip increased to 35-40#.   Time 8   Period Weeks   Status On-going  5#   PT LONG TERM GOAL #3   Title Patient perform 20 minutes of aerobic activiity with only one rest.   Time 8   Period Weeks   Status On-going   PT LONG TERM GOAL #4   Title restore normal right UE proprioception.   Time 8   Period Weeks   Status On-going   PT LONG TERM GOAL #5   Title Patient walk in clinic 500 feet without ataxia and supervision only.   Time 8   Period Weeks   Status On-going     TREATMENT:  THERE EX:  Nustep level 3 x 15 minutes; UBE x 8 minutes with assistance to maintain right grip f/b right wrist manually resisted exercise x 5 minutes.  Neuro Re-education:  In parallel bars:  Rockerboard x 5 minutes and inverted  BOSU x 5 minutes          Problem List Patient Active Problem List   Diagnosis Date Noted  . Cerebral infarction due to embolism of left middle cerebral artery 12/15/2014  . Paroxysmal atrial fibrillation 12/15/2014  . CKD (chronic kidney disease) 12/15/2014  . Hyperlipidemia 12/15/2014  . Type 2 diabetes mellitus with other circulatory complications 65/53/7482  . B12 deficiency anemia 12/10/2014  . Right hemiparesis 10/26/2014  . Diabetes mellitus 10/26/2014  . Essential hypertension   . HLD (hyperlipidemia)   . Stroke 10/25/2014  . CEREBROVASCULAR DISEASE 12/31/2009  . MURMUR 12/12/2008  . MYOCARDIAL INFARCTION 12/11/2008  . ANGINA, UNSTABLE 12/11/2008  . Coronary atherosclerosis 12/11/2008  . SUPRAVENTRICULAR TACHYCARDIA 12/11/2008  . Cerebral artery occlusion with cerebral infarction 12/11/2008  . Disorder resulting from impaired renal function 12/11/2008  . CAROTID BRUIT 12/11/2008  . HYPERGLYCEMIA 12/11/2008    [name] [name], [name] [name] MPT 01/11/2015, 4:28 PM  [facility] [address] [place], [place],  [address] Phone: [phone]   Fax:  [phone]

## 2015-01-16 ENCOUNTER — Ambulatory Visit: Payer: Medicare Other | Admitting: Physical Therapy

## 2015-01-16 ENCOUNTER — Encounter: Payer: Self-pay | Admitting: Physical Therapy

## 2015-01-16 VITALS — BP 108/63 | HR 81

## 2015-01-16 DIAGNOSIS — R270 Ataxia, unspecified: Secondary | ICD-10-CM

## 2015-01-16 DIAGNOSIS — IMO0002 Reserved for concepts with insufficient information to code with codable children: Secondary | ICD-10-CM

## 2015-01-16 NOTE — Therapy (Signed)
[facility] [place], [place], [address] Phone: [phone]   Fax:  [phone]  Physical Therapy Treatment  Patient Details  Name: [name] [name] MRN: [id number] Date of Birth: 07-08-1941 Referring Provider:  Stephens Shire, MD  Encounter Date: 01/16/2015      PT End of Session - 01/16/15 1308    Visit Number 5   Number of Visits 18   Date for PT Re-Evaluation 02/26/15   PT Start Time 1300   PT Stop Time 1346   PT Time Calculation (min) 46 min   Activity Tolerance Patient tolerated treatment well   Behavior During Therapy [facility] for tasks assessed/performed      Past Medical History  Diagnosis Date  . SUPRAVENTRICULAR TACHYCARDIA   . MYOCARDIAL INFARCTION     x 2  . HYPERTENSION   . HYPERLIPIDEMIA   . CEREBROVASCULAR DISEASE   . CEREBROVASCULAR ACCIDENT   . CAD   . RENAL INSUFFICIENCY   . DIABETES MELLITUS   . Cancer     skin    Past Surgical History  Procedure Laterality Date  . Coronary stent placement  2002, 2003    x 4  . Loop recorder implant N/A 10/27/2014    Procedure: LOOP RECORDER IMPLANT;  Surgeon: Deboraha Sprang, MD;  Location: [facility] CATH LAB;  Service: Cardiovascular;  Laterality: N/A;  . Tee without cardioversion N/A 10/27/2014    Procedure: TRANSESOPHAGEAL ECHOCARDIOGRAM (TEE);  Surgeon: Dorothy Spark, MD;  Location: [facility] ENDOSCOPY;  Service: Cardiovascular;  Laterality: N/A;    Filed Vitals:   01/16/15 1308  BP: 108/63  Pulse: 81  SpO2: 94%    Visit Diagnosis:  Ataxia  Weakness due to cerebrovascular accident      Subjective Assessment - 01/16/15 1306    Subjective States that the glove is too tight and doesn't have anyone at home to help him with anything. States that the pain around the R shoulder blade has spread to the L shoulder blade now. Reports that last night and this morning he had pain in his L chest into L back and down R arm and almost called to cancel appointment today.   Limitations Walking            [facility] PT Assessment - 01/16/15 0001    Assessment   Medical Diagnosis Stroke                     [facility] Adult PT Treatment/Exercise - 01/16/15 0001    Knee/Hip Exercises: Aerobic   Stationary Bike NuStep L4 x12 min   Elliptical UBE 60 RPM x30min  Requires assist with R grip   Knee/Hip Exercises: Standing   Forward Step Up Both;2 sets;10 reps;Step Height: 6"   Rocker Board 3 minutes   Other Standing Knee Exercises RUE stab ball roll 3x10   Hand Exercises   Other Hand Exercises Red web gri, R wrist flex/ext with manual resistance x30 reps each, towel squeeze x63min                  PT Short Term Goals - 01/09/15 1248    PT SHORT TERM GOAL #1   Title Ind with initial HEP.   Time 2   Period Weeks   Status On-going           PT Long Term Goals - 01/09/15 1248    PT LONG TERM GOAL #1   Title Ind with advanced HEP.   Time 8  Period Weeks   Status On-going   PT LONG TERM GOAL #2   Title Right grip increased to 35-40#.   Time 8   Period Weeks   Status On-going  5#   PT LONG TERM GOAL #3   Title Patient perform 20 minutes of aerobic activiity with only one rest.   Time 8   Period Weeks   Status On-going   PT LONG TERM GOAL #4   Title restore normal right UE proprioception.   Time 8   Period Weeks   Status On-going   PT LONG TERM GOAL #5   Title Patient walk in clinic 500 feet without ataxia and supervision only.   Time 8   Period Weeks   Status On-going               Plan - 01/16/15 1358    Clinical Impression Statement Patient was monitored for vitals during session due to chest and back pain. After aerobic 123/60, 96%, 78 bpm, after treatment 135/63, 98%, 82 bpm. Was not wearing glove given at last session. Continues to demonstrate R hand/wrist weakness. Denied pain following treatment.   Pt will benefit from skilled therapeutic intervention in order to improve on the following deficits Decreased  activity tolerance;Abnormal gait;Decreased coordination;Decreased endurance;Decreased balance;Decreased strength   Rehab Potential Good   PT Frequency 3x / week   PT Duration 8 weeks   PT Treatment/Interventions ADLs/Self Care Home Management;Patient/family education;Therapeutic exercise;Balance training;Neuromuscular re-education   PT Next Visit Plan Continue per PT POC with LE strength and  Rt UE proprioception/strength exercises next treatment.   PT Home Exercise Plan issue initial HEP   Consulted and Agree with Plan of Care Patient        Problem List Patient Active Problem List   Diagnosis Date Noted  . Cerebral infarction due to embolism of left middle cerebral artery 12/15/2014  . Paroxysmal atrial fibrillation 12/15/2014  . CKD (chronic kidney disease) 12/15/2014  . Hyperlipidemia 12/15/2014  . Type 2 diabetes mellitus with other circulatory complications 74/03/1447  . B12 deficiency anemia 12/10/2014  . Right hemiparesis 10/26/2014  . Diabetes mellitus 10/26/2014  . Essential hypertension   . HLD (hyperlipidemia)   . Stroke 10/25/2014  . CEREBROVASCULAR DISEASE 12/31/2009  . MURMUR 12/12/2008  . MYOCARDIAL INFARCTION 12/11/2008  . ANGINA, UNSTABLE 12/11/2008  . Coronary atherosclerosis 12/11/2008  . SUPRAVENTRICULAR TACHYCARDIA 12/11/2008  . Cerebral artery occlusion with cerebral infarction 12/11/2008  . Disorder resulting from impaired renal function 12/11/2008  . CAROTID BRUIT 12/11/2008  . HYPERGLYCEMIA 12/11/2008    Wynelle Fanny, PTA 01/16/2015, 2:02 PM  [facility] [address] [place], [place], [address] Phone: [phone]   Fax:  [phone]

## 2015-01-17 ENCOUNTER — Encounter: Payer: Self-pay | Admitting: Internal Medicine

## 2015-01-18 ENCOUNTER — Encounter: Payer: Self-pay | Admitting: Cardiology

## 2015-01-19 ENCOUNTER — Ambulatory Visit: Payer: Medicare Other | Admitting: *Deleted

## 2015-01-19 ENCOUNTER — Encounter: Payer: Self-pay | Admitting: *Deleted

## 2015-01-19 DIAGNOSIS — IMO0002 Reserved for concepts with insufficient information to code with codable children: Secondary | ICD-10-CM

## 2015-01-19 DIAGNOSIS — R270 Ataxia, unspecified: Secondary | ICD-10-CM | POA: Diagnosis not present

## 2015-01-19 NOTE — Therapy (Signed)
[facility] [place], [place], [address] Phone: [phone]   Fax:  [phone]  Physical Therapy Treatment  Patient Details  Name: [name] [name] MRN: [id number] Date of Birth: 01/28/1941 Referring Provider:  Stephens Shire, MD  Encounter Date: 01/19/2015      PT End of Session - 01/19/15 1007    Visit Number 6   Number of Visits 18   Date for PT Re-Evaluation 02/26/15   PT Start Time 0945   PT Stop Time 1036   PT Time Calculation (min) 51 min      Past Medical History  Diagnosis Date  . SUPRAVENTRICULAR TACHYCARDIA   . MYOCARDIAL INFARCTION     x 2  . HYPERTENSION   . HYPERLIPIDEMIA   . CEREBROVASCULAR DISEASE   . CEREBROVASCULAR ACCIDENT   . CAD   . RENAL INSUFFICIENCY   . DIABETES MELLITUS   . Cancer     skin    Past Surgical History  Procedure Laterality Date  . Coronary stent placement  2002, 2003    x 4  . Loop recorder implant N/A 10/27/2014    Procedure: LOOP RECORDER IMPLANT;  Surgeon: Deboraha Sprang, MD;  Location: [facility] CATH LAB;  Service: Cardiovascular;  Laterality: N/A;  . Tee without cardioversion N/A 10/27/2014    Procedure: TRANSESOPHAGEAL ECHOCARDIOGRAM (TEE);  Surgeon: Dorothy Spark, MD;  Location: [place];  Service: Cardiovascular;  Laterality: N/A;    There were no vitals filed for this visit.  Visit Diagnosis:  Ataxia  Weakness due to cerebrovascular accident      Subjective Assessment - 01/19/15 0950    Subjective States that the glove is too tight and doesn't have anyone at home to help him with anything. States that the pain around the R shoulder blade has spread to the L shoulder blade now, but this happens at night. No pain today . Pt states he will call MD today   Limitations Walking   How long can you walk comfortably? Patient has a staggered gait.  Told him with wife present to use a cane at all times for safety.  I states he has not fallen.   Currently in Pain?  No/denies                         [facility] Adult PT Treatment/Exercise - 01/19/15 0001    Exercises   Exercises Hand;Knee/Hip   Knee/Hip Exercises: Aerobic   Stationary Bike NuStep L5 x13 min   Elliptical UBE 60 RPM x22min   Knee/Hip Exercises: Standing   Forward Step Up Both;2 sets;10 reps;Step Height: 6"  Also toe taps 2x20 for balance   Rocker Board 3 minutes   Other Standing Knee Exercises RUE stab ball roll 3x10   Hand Exercises   Other Hand Exercises Ball on wall for WB RT UE 3x10 up/down and side to side   Other Hand Exercises UE RANGER flexion, circles CCW/CW 2x10 each                  PT Short Term Goals - 01/09/15 1248    PT SHORT TERM GOAL #1   Title Ind with initial HEP.   Time 2   Period Weeks   Status On-going           PT Long Term Goals - 01/09/15 1248    PT LONG TERM GOAL #1   Title Ind with advanced HEP.   Time 8  Period Weeks   Status On-going   PT LONG TERM GOAL #2   Title Right grip increased to 35-40#.   Time 8   Period Weeks   Status On-going  5#   PT LONG TERM GOAL #3   Title Patient perform 20 minutes of aerobic activiity with only one rest.   Time 8   Period Weeks   Status On-going   PT LONG TERM GOAL #4   Title restore normal right UE proprioception.   Time 8   Period Weeks   Status On-going   PT LONG TERM GOAL #5   Title Patient walk in clinic 500 feet without ataxia and supervision only.   Time 8   Period Weeks   Status On-going               Plan - 01/19/15 1007    Pt will benefit from skilled therapeutic intervention in order to improve on the following deficits Decreased activity tolerance;Abnormal gait;Decreased coordination;Decreased endurance;Decreased balance;Decreased strength   PT Frequency 3x / week   PT Duration 8 weeks   PT Treatment/Interventions ADLs/Self Care Home Management;Patient/family education;Therapeutic exercise;Balance training;Neuromuscular re-education   PT Next  Visit Plan Continue per PT POC with LE strength and  Rt UE proprioception/strength exercises next treatment.        Problem List Patient Active Problem List   Diagnosis Date Noted  . Cerebral infarction due to embolism of left middle cerebral artery 12/15/2014  . Paroxysmal atrial fibrillation 12/15/2014  . CKD (chronic kidney disease) 12/15/2014  . Hyperlipidemia 12/15/2014  . Type 2 diabetes mellitus with other circulatory complications 83/66/2947  . B12 deficiency anemia 12/10/2014  . Right hemiparesis 10/26/2014  . Diabetes mellitus 10/26/2014  . Essential hypertension   . HLD (hyperlipidemia)   . Stroke 10/25/2014  . CEREBROVASCULAR DISEASE 12/31/2009  . MURMUR 12/12/2008  . MYOCARDIAL INFARCTION 12/11/2008  . ANGINA, UNSTABLE 12/11/2008  . Coronary atherosclerosis 12/11/2008  . SUPRAVENTRICULAR TACHYCARDIA 12/11/2008  . Cerebral artery occlusion with cerebral infarction 12/11/2008  . Disorder resulting from impaired renal function 12/11/2008  . CAROTID BRUIT 12/11/2008  . HYPERGLYCEMIA 12/11/2008    RAMSEUR,[name], PTA 01/19/2015, 11:00 AM  [facility] [place], [place], [address] Phone: [phone]   Fax:  [phone]

## 2015-01-21 NOTE — Progress Notes (Signed)
Patient ID: [name] [name], male   DOB: 19-May-1941, 74 y.o.   MRN: [id number]  [name] [name] living [place]     No Known Allergies     Chief Complaint  Patient presents with  . Discharge Note    HPI:  He is being discharged to home with home health for pt/ot/rn for gait strength adl retraining and medication management. He will not need dme. He will need his prescriptions to be written and will need a follow up with his pcp.    Past Medical History  Diagnosis Date  . SUPRAVENTRICULAR TACHYCARDIA   . MYOCARDIAL INFARCTION     x 2  . HYPERTENSION   . HYPERLIPIDEMIA   . CEREBROVASCULAR DISEASE   . CEREBROVASCULAR ACCIDENT   . CAD   . RENAL INSUFFICIENCY   . DIABETES MELLITUS   . Cancer     skin    Past Surgical History  Procedure Laterality Date  . Coronary stent placement  2002, 2003    x 4  . Loop recorder implant N/A 10/27/2014    Procedure: LOOP RECORDER IMPLANT;  Surgeon: Deboraha Sprang, MD;  Location: [facility] CATH LAB;  Service: Cardiovascular;  Laterality: N/A;  . Tee without cardioversion N/A 10/27/2014    Procedure: TRANSESOPHAGEAL ECHOCARDIOGRAM (TEE);  Surgeon: Dorothy Spark, MD;  Location: [place];  Service: Cardiovascular;  Laterality: N/A;    VITAL SIGNS BP 125/70 mmHg  Pulse 78  Ht 5\' 6"  (1.676 m)  Wt 181 lb (82.101 kg)  BMI 29.23 kg/m2   Outpatient Encounter Prescriptions as of 11/10/2014  Medication Sig   plavix 75 mg daily  Vit b12 100 mcg daily  Glipizide 5 mg daily  lantus 30 units nightly niaspan er 1gm daily  novolog 3 units with meals pravachol 80 mg daily  Senna s nighlty  toprol xl 50 mg daily        SIGNIFICANT DIAGNOSTIC EXAMS   10-25-14: ct angio of head and neck: CT HEAD:  Mild to moderate motion degraded examination.No acute intracranial process. LEFT frontoparietal encephalomalacia suggest remote LEFT middle cerebral artery territory infarct. Moderate to severe parenchymal brain volume loss. Moderate to severe white  matter changes suggest chronic small vessel ischemic disease. Chronic appearing RIGHT basal ganglia and RIGHT thalamus lacunar infarcts. CTA NECK: Atherosclerosis of the carotid bulbs, with up to 40% stenosis of LEFT internal carotid artery by NASCET criteria. Approximately 50% stenosis of vertebral artery origin. Acute maxillary sinusitis. CTA HEAD: Complete circle of Willis without large vessel occlusion or hemodynamically significant stenosis. Dolichoectatic intracranial vessels suggests sequelae of chronic hypertension.  10-26-14: renal ultrasound: Negative renal ultrasound  10-26-14 mri of brain: Acute LEFT MCA territory posterior frontal cortical and subcortical white matter infarction. No hemorrhage or mass effect. Progression of generalized atrophy and small vessel disease with sequelae of multiple prior infarcts which were acute in 2008. No large vessel occlusion is evident. Acute and chronic maxillary sinus disease.  10-26-14: bilateral lower extremity doppler: Visualized veins of bilateral lower extremities which were able to be compressed are negative for deep vein thrombosis. Unable to perform compression maneuvers on some segments due to poor patient cooperation and guarding. There is no evidence of Baker&'s cyst bilaterally.  10-27-14: TEE: Left ventricle: Systolic function was normal. The estimated ejection fraction was in the range of 60% to 65%. Wall motion was normal; there were no regional wall motion abnormalities. - Aortic valve: No evidence of vegetation. There was mild regurgitation. 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 -  Mitral valve: There was mild regurgitation. - Left atrium: No evidence of thrombus in the atrial cavity or appendage. No evidence of thrombus in the atrial cavity or appendage. - Right atrium: No evidence of thrombus in the atrial cavity or appendage. - Atrial septum: There was increased thickness of the septum, consistent with lipomatous hypertrophy. No  defect or patent foramen ovale was identified.    LABS REVIEWED:   3-2-216: wbc 10.0; hgb 11.8; hct 36.1; mcv 88; plt 263; glucose 302; bun 41; creat 3.03; k+4.3; na++138; liver normal albumin 2.9 10-26-14: glucose 107; bun 36; creat 2.74; k+3.8; na++141; chol 132; ldl 65; trig 197; hdl 28; hgb a1c 7.7 10-29-14: glucose 87; bun 37; creat 3.08; k+4.0; na++138; liver normal albumin 3.0  11-06-14: wbc 8.7; hgb 11.2; hct 34.5; mcv 87.3; plt 260; glucose 126; bun 29; creat 2.43; k+4.3; na++141; liver normal albumin 3.2      ROS Constitutional: Negative for malaise/fatigue.  Respiratory: Negative for cough and shortness of breath.   Cardiovascular:  Negative for chest pain.  Gastrointestinal: Negative for abdominal pain and constipation.  Musculoskeletal: Negative for myalgias and joint pain.  Skin: Negative.   Neurological: negative for weakness   Psychiatric/Behavioral: The patient is not nervous/anxious.       Physical Exam Constitutional: He appears well-developed and well-nourished. No distress.  Neck: Neck supple. No JVD present. No thyromegaly present.  Cardiovascular: Normal rate, regular rhythm and intact distal pulses.   Murmur heard. Respiratory: Effort normal and breath sounds normal. No respiratory distress.  GI: Soft. Bowel sounds are normal. He exhibits no distension. There is no tenderness.  Musculoskeletal: He exhibits no edema.  Has right hemiplegia   Neurological: He is alert.  Skin: Skin is warm and dry. He is not diaphoretic.     ASSESSMENT/ PLAN:  Will discharge to home with home health for pt/ot/rn to evaluate and treat as indicated for gait; strength; adl retraining and medication management. He will not need any dme. His prescriptions have been written for a 30 supply of medications. He has a follow up with his pcp Dr. Juanita Craver on 11-24-14 at 9:30 am.    Time spent with patient 40 minutes.     Ok Edwards NP [facility] Adult Medicine  Contact  [phone] Monday through Friday 8am- 5pm  After hours call [phone]

## 2015-01-23 ENCOUNTER — Ambulatory Visit: Payer: Medicare Other | Admitting: *Deleted

## 2015-01-23 ENCOUNTER — Encounter: Payer: Self-pay | Admitting: *Deleted

## 2015-01-23 DIAGNOSIS — R270 Ataxia, unspecified: Secondary | ICD-10-CM

## 2015-01-23 DIAGNOSIS — IMO0002 Reserved for concepts with insufficient information to code with codable children: Secondary | ICD-10-CM

## 2015-01-23 NOTE — Therapy (Signed)
[facility] [place], [place], [address] Phone: [phone]   Fax:  [phone]  Physical Therapy Treatment  Patient Details  Name: [name] [name] MRN: [id number] Date of Birth: 07/19/1941 Referring Provider:  Stephens Shire, MD  Encounter Date: 01/23/2015      PT End of Session - 01/23/15 1318    Visit Number 7   Number of Visits 18   Date for PT Re-Evaluation 02/26/15   PT Start Time 1115   PT Stop Time 1205   PT Time Calculation (min) 50 min      Past Medical History  Diagnosis Date  . SUPRAVENTRICULAR TACHYCARDIA   . MYOCARDIAL INFARCTION     x 2  . HYPERTENSION   . HYPERLIPIDEMIA   . CEREBROVASCULAR DISEASE   . CEREBROVASCULAR ACCIDENT   . CAD   . RENAL INSUFFICIENCY   . DIABETES MELLITUS   . Cancer     skin    Past Surgical History  Procedure Laterality Date  . Coronary stent placement  2002, 2003    x 4  . Loop recorder implant N/A 10/27/2014    Procedure: LOOP RECORDER IMPLANT;  Surgeon: Deboraha Sprang, MD;  Location: [facility] CATH LAB;  Service: Cardiovascular;  Laterality: N/A;  . Tee without cardioversion N/A 10/27/2014    Procedure: TRANSESOPHAGEAL ECHOCARDIOGRAM (TEE);  Surgeon: Dorothy Spark, MD;  Location: [place];  Service: Cardiovascular;  Laterality: N/A;    There were no vitals filed for this visit.  Visit Diagnosis:  Ataxia  Weakness due to cerebrovascular accident      Subjective Assessment - 01/23/15 1130    Subjective Did ok after last Rx.  Stay stiff   Limitations Walking   How long can you walk comfortably? Patient has a staggered gait.  Told him with wife present to use a cane at all times for safety.  I states he has not fallen.   Currently in Pain? No/denies                         [facility] Adult PT Treatment/Exercise - 01/23/15 0001    Exercises   Exercises Shoulder;Hand;Knee/Hip;Ankle   Knee/Hip Exercises: Aerobic   Stationary Bike NuStep L5 x13 min  UE/LE activity   Elliptical UBE 120 RPM x19min with focus on RT UE. needs Assistance for RT hand at times   Knee/Hip Exercises: Standing   Forward Step Up Both;2 sets;10 reps;Step Height: 6"  Also toe taps 2x20 for balance   Rocker Board 5 minutes   Shoulder Exercises: Standing   Other Standing Exercises UE Ranger elevation, circles each way 3x10                  PT Short Term Goals - 01/09/15 1248    PT SHORT TERM GOAL #1   Title Ind with initial HEP.   Time 2   Period Weeks   Status On-going           PT Long Term Goals - 01/09/15 1248    PT LONG TERM GOAL #1   Title Ind with advanced HEP.   Time 8   Period Weeks   Status On-going   PT LONG TERM GOAL #2   Title Right grip increased to 35-40#.   Time 8   Period Weeks   Status On-going  5#   PT LONG TERM GOAL #3   Title Patient perform 20 minutes of aerobic activiity with only one  rest.   Time 8   Period Weeks   Status On-going   PT LONG TERM GOAL #4   Title restore normal right UE proprioception.   Time 8   Period Weeks   Status On-going   PT LONG TERM GOAL #5   Title Patient walk in clinic 500 feet without ataxia and supervision only.   Time 8   Period Weeks   Status On-going               Plan - 01/23/15 1308    Clinical Impression Statement Pt feels that he is doing better over all with balance and walking. His RT UE is still with strength deficits, but he was able to control it better with Exs. still needs help eith grip on UBE.   Pt will benefit from skilled therapeutic intervention in order to improve on the following deficits Decreased activity tolerance;Abnormal gait;Decreased coordination;Decreased endurance;Decreased balance;Decreased strength   Rehab Potential Good   PT Frequency 3x / week   PT Duration 8 weeks   PT Treatment/Interventions ADLs/Self Care Home Management;Patient/family education;Therapeutic exercise;Balance training;Neuromuscular re-education   PT Next Visit Plan  Continue per PT POC with LE strength and  Rt UE proprioception/strength exercises next treatment.   Consulted and Agree with Plan of Care Patient        Problem List Patient Active Problem List   Diagnosis Date Noted  . Cerebral infarction due to embolism of left middle cerebral artery 12/15/2014  . Paroxysmal atrial fibrillation 12/15/2014  . CKD (chronic kidney disease) 12/15/2014  . Hyperlipidemia 12/15/2014  . Type 2 diabetes mellitus with other circulatory complications 93/26/7124  . B12 deficiency anemia 12/10/2014  . Right hemiparesis 10/26/2014  . Diabetes mellitus 10/26/2014  . Essential hypertension   . HLD (hyperlipidemia)   . Stroke 10/25/2014  . CEREBROVASCULAR DISEASE 12/31/2009  . MURMUR 12/12/2008  . MYOCARDIAL INFARCTION 12/11/2008  . ANGINA, UNSTABLE 12/11/2008  . Coronary atherosclerosis 12/11/2008  . SUPRAVENTRICULAR TACHYCARDIA 12/11/2008  . Cerebral artery occlusion with cerebral infarction 12/11/2008  . Disorder resulting from impaired renal function 12/11/2008  . CAROTID BRUIT 12/11/2008  . HYPERGLYCEMIA 12/11/2008    Gayla Benn,[name], PTA 01/23/2015, 1:19 PM  [facility] [address] [place], [place], [address] Phone: [phone]   Fax:  [phone]

## 2015-01-25 ENCOUNTER — Ambulatory Visit: Payer: Medicare Other | Attending: Neurology | Admitting: *Deleted

## 2015-01-25 ENCOUNTER — Encounter: Payer: Self-pay | Admitting: *Deleted

## 2015-01-25 DIAGNOSIS — R270 Ataxia, unspecified: Secondary | ICD-10-CM | POA: Diagnosis present

## 2015-01-25 DIAGNOSIS — I69898 Other sequelae of other cerebrovascular disease: Secondary | ICD-10-CM | POA: Insufficient documentation

## 2015-01-25 DIAGNOSIS — R531 Weakness: Secondary | ICD-10-CM | POA: Diagnosis present

## 2015-01-25 DIAGNOSIS — IMO0002 Reserved for concepts with insufficient information to code with codable children: Secondary | ICD-10-CM

## 2015-01-25 NOTE — Therapy (Signed)
[facility] [place], [place], [address] Phone: [phone]   Fax:  [phone]  Physical Therapy Treatment  Patient Details  Name: [name] [name] MRN: [id number] Date of Birth: Oct 21, 1940 Referring Provider:  Stephens Shire, MD  Encounter Date: 01/25/2015      PT End of Session - 01/25/15 1147    Visit Number 8   Number of Visits 18   PT Start Time 1115   PT Stop Time 1210   PT Time Calculation (min) 55 min      Past Medical History  Diagnosis Date  . SUPRAVENTRICULAR TACHYCARDIA   . MYOCARDIAL INFARCTION     x 2  . HYPERTENSION   . HYPERLIPIDEMIA   . CEREBROVASCULAR DISEASE   . CEREBROVASCULAR ACCIDENT   . CAD   . RENAL INSUFFICIENCY   . DIABETES MELLITUS   . Cancer     skin    Past Surgical History  Procedure Laterality Date  . Coronary stent placement  2002, 2003    x 4  . Loop recorder implant N/A 10/27/2014    Procedure: LOOP RECORDER IMPLANT;  Surgeon: Deboraha Sprang, MD;  Location: [facility] CATH LAB;  Service: Cardiovascular;  Laterality: N/A;  . Tee without cardioversion N/A 10/27/2014    Procedure: TRANSESOPHAGEAL ECHOCARDIOGRAM (TEE);  Surgeon: Dorothy Spark, MD;  Location: [place];  Service: Cardiovascular;  Laterality: N/A;    There were no vitals filed for this visit.  Visit Diagnosis:  Ataxia  Weakness due to cerebrovascular accident      Subjective Assessment - 01/25/15 1153    Subjective My RT arm is doing better. ADLs getting easier. Able to carry buckets and feed my chickens now   How long can you walk comfortably? Patient has a staggered gait.  Told him with wife present to use a cane at all times for safety.  I states he has not fallen.   Currently in Pain? No/denies                         [facility] Adult PT Treatment/Exercise - 01/25/15 0001    Exercises   Exercises Shoulder;Hand;Knee/Hip;Ankle   Knee/Hip Exercises: Aerobic   Stationary Bike NuStep L5 x13 min  UE/LE activity   Knee/Hip Exercises: Standing   Forward Step Up Both;2 sets;10 reps;Step Height: 6"  Also toe taps 2x20 for balance   Rocker Board 5 minutes   Hand Exercises   Theraputty Roll;Grip;Pinch   Other Hand Exercises Power web gripping and finger opposition   Other Hand Exercises Power Tband bar for wrist flexion / extension, supination/pronation                PT Education - 01/25/15 1255    Education provided Yes   Education Details Putty Exs for RT hand   Person(s) Educated Patient   Methods Explanation;Demonstration;Tactile cues;Verbal cues;Handout   Comprehension Verbalized understanding;Returned demonstration          PT Short Term Goals - 01/25/15 1248    PT SHORT TERM GOAL #1   Title Ind with initial HEP.   Time 2   Period Weeks   Status Achieved           PT Long Term Goals - 01/09/15 1248    PT LONG TERM GOAL #1   Title Ind with advanced HEP.   Time 8   Period Weeks   Status On-going   PT LONG TERM GOAL #2  Title Right grip increased to 35-40#.   Time 8   Period Weeks   Status On-going  5#   PT LONG TERM GOAL #3   Title Patient perform 20 minutes of aerobic activiity with only one rest.   Time 8   Period Weeks   Status On-going   PT LONG TERM GOAL #4   Title restore normal right UE proprioception.   Time 8   Period Weeks   Status On-going   PT LONG TERM GOAL #5   Title Patient walk in clinic 500 feet without ataxia and supervision only.   Time 8   Period Weeks   Status On-going               Plan - 01/25/15 1147    Clinical Impression Statement Pt did well with Rx today and is independent with current HEP with putty exercises for RT hand. STG #1 was met, but LTGs are on-going at this time due to strength and balance deficits   Pt will benefit from skilled therapeutic intervention in order to improve on the following deficits Decreased activity tolerance;Abnormal gait;Decreased coordination;Decreased  endurance;Decreased balance;Decreased strength   Rehab Potential Good   PT Frequency 3x / week   PT Duration 8 weeks   PT Treatment/Interventions ADLs/Self Care Home Management;Patient/family education;Therapeutic exercise;Balance training;Neuromuscular re-education   PT Next Visit Plan Continue per PT POC with LE strength and  Rt UE proprioception/strength exercises next treatment. Grip strength and digit opposition        Problem List Patient Active Problem List   Diagnosis Date Noted  . Cerebral infarction due to embolism of left middle cerebral artery 12/15/2014  . Paroxysmal atrial fibrillation 12/15/2014  . CKD (chronic kidney disease) 12/15/2014  . Hyperlipidemia 12/15/2014  . Type 2 diabetes mellitus with other circulatory complications 53/79/4327  . B12 deficiency anemia 12/10/2014  . Right hemiparesis 10/26/2014  . Diabetes mellitus 10/26/2014  . Essential hypertension   . HLD (hyperlipidemia)   . Stroke 10/25/2014  . CEREBROVASCULAR DISEASE 12/31/2009  . MURMUR 12/12/2008  . MYOCARDIAL INFARCTION 12/11/2008  . ANGINA, UNSTABLE 12/11/2008  . Coronary atherosclerosis 12/11/2008  . SUPRAVENTRICULAR TACHYCARDIA 12/11/2008  . Cerebral artery occlusion with cerebral infarction 12/11/2008  . Disorder resulting from impaired renal function 12/11/2008  . CAROTID BRUIT 12/11/2008  . HYPERGLYCEMIA 12/11/2008    RAMSEUR,[name], PTA 01/25/2015, 12:56 PM  [facility] [address] [place], [place], [address] Phone: [phone]   Fax:  [phone]

## 2015-01-30 ENCOUNTER — Ambulatory Visit: Payer: Medicare Other | Admitting: Physical Therapy

## 2015-01-30 ENCOUNTER — Encounter: Payer: Self-pay | Admitting: Physical Therapy

## 2015-01-30 VITALS — HR 89

## 2015-01-30 DIAGNOSIS — IMO0002 Reserved for concepts with insufficient information to code with codable children: Secondary | ICD-10-CM

## 2015-01-30 DIAGNOSIS — R270 Ataxia, unspecified: Secondary | ICD-10-CM

## 2015-01-30 NOTE — Therapy (Signed)
[facility] [place], [place], [address] Phone: [phone]   Fax:  [phone]  Physical Therapy Treatment  Patient Details  Name: [name] [name] MRN: [id number] Date of Birth: Feb 08, 1941 Referring Provider:  Stephens Shire, MD  Encounter Date: 01/30/2015      PT End of Session - 01/30/15 1124    Visit Number 9   Number of Visits 18   Date for PT Re-Evaluation 02/26/15   PT Start Time 1115   PT Stop Time 1200   PT Time Calculation (min) 45 min   Activity Tolerance Patient tolerated treatment well   Behavior During Therapy [facility] for tasks assessed/performed      Past Medical History  Diagnosis Date  . SUPRAVENTRICULAR TACHYCARDIA   . MYOCARDIAL INFARCTION     x 2  . HYPERTENSION   . HYPERLIPIDEMIA   . CEREBROVASCULAR DISEASE   . CEREBROVASCULAR ACCIDENT   . CAD   . RENAL INSUFFICIENCY   . DIABETES MELLITUS   . Cancer     skin    Past Surgical History  Procedure Laterality Date  . Coronary stent placement  2002, 2003    x 4  . Loop recorder implant N/A 10/27/2014    Procedure: LOOP RECORDER IMPLANT;  Surgeon: Deboraha Sprang, MD;  Location: [facility] CATH LAB;  Service: Cardiovascular;  Laterality: N/A;  . Tee without cardioversion N/A 10/27/2014    Procedure: TRANSESOPHAGEAL ECHOCARDIOGRAM (TEE);  Surgeon: Dorothy Spark, MD;  Location: [facility] ENDOSCOPY;  Service: Cardiovascular;  Laterality: N/A;    Filed Vitals:   01/30/15 1135  Pulse: 89  SpO2: 97%    Visit Diagnosis:  Ataxia  Weakness due to cerebrovascular accident      Subjective Assessment - 01/30/15 1124    Subjective Reports fatigue and dizziness prior to therapy. States that he doesn't have any better use of R hand and palm and fingers fell cold and are "dead."   Limitations Walking   How long can you walk comfortably? Patient has a staggered gait.  Told him with wife present to use a cane at all times for safety.  I states he has not fallen.    Currently in Pain? No/denies            [facility] PT Assessment - 01/30/15 0001    Assessment   Medical Diagnosis Stroke   Onset Date/Surgical Date 10/31/14                     [facility] Adult PT Treatment/Exercise - 01/30/15 0001    Knee/Hip Exercises: Aerobic   Stationary Bike NuStep L6 x12 min   Knee/Hip Exercises: Standing   Forward Step Up Both;3 sets;10 reps;Step Height: 6"   Rocker Board 4 minutes   Other Standing Knee Exercises BLE toe taps x30 reps to 6" step   Hand Exercises   Theraputty - Roll 3 min; yellow putty   Theraputty - Grip 3 min; yellow putty   Theraputty - Pinch 3 min; yellow putty   Other Hand Exercises R wrist flex/ext/supination/pronation 1# x30 reps each   Other Hand Exercises Power web grip x3 min                  PT Short Term Goals - 01/25/15 1248    PT SHORT TERM GOAL #1   Title Ind with initial HEP.   Time 2   Period Weeks   Status Achieved  PT Long Term Goals - 01/09/15 1248    PT LONG TERM GOAL #1   Title Ind with advanced HEP.   Time 8   Period Weeks   Status On-going   PT LONG TERM GOAL #2   Title Right grip increased to 35-40#.   Time 8   Period Weeks   Status On-going  5#   PT LONG TERM GOAL #3   Title Patient perform 20 minutes of aerobic activiity with only one rest.   Time 8   Period Weeks   Status On-going   PT LONG TERM GOAL #4   Title restore normal right UE proprioception.   Time 8   Period Weeks   Status On-going   PT LONG TERM GOAL #5   Title Patient walk in clinic 500 feet without ataxia and supervision only.   Time 8   Period Weeks   Status On-going               Plan - 01/30/15 1201    Clinical Impression Statement Patient tolerated treatment well without complaint of pain. Continues to have difficulty with mobility of R hand and fingers. Required manual stabilization of R forearm during R wrist strengthening for correct technique. LT goals considered on-going at  this time due to decreased endurance, strength. Denied pain following treamtent.   Pt will benefit from skilled therapeutic intervention in order to improve on the following deficits Decreased activity tolerance;Abnormal gait;Decreased coordination;Decreased endurance;Decreased balance;Decreased strength   Rehab Potential Good   PT Frequency 3x / week   PT Duration 8 weeks   PT Treatment/Interventions ADLs/Self Care Home Management;Patient/family education;Therapeutic exercise;Balance training;Neuromuscular re-education   PT Next Visit Plan Continue per PT POC with LE strength and  Rt UE proprioception/strength exercises next treatment. Grip strength and digit opposition   Consulted and Agree with Plan of Care Patient        Problem List Patient Active Problem List   Diagnosis Date Noted  . Cerebral infarction due to embolism of left middle cerebral artery 12/15/2014  . Paroxysmal atrial fibrillation 12/15/2014  . CKD (chronic kidney disease) 12/15/2014  . Hyperlipidemia 12/15/2014  . Type 2 diabetes mellitus with other circulatory complications 03/55/9741  . B12 deficiency anemia 12/10/2014  . Right hemiparesis 10/26/2014  . Diabetes mellitus 10/26/2014  . Essential hypertension   . HLD (hyperlipidemia)   . Stroke 10/25/2014  . CEREBROVASCULAR DISEASE 12/31/2009  . MURMUR 12/12/2008  . MYOCARDIAL INFARCTION 12/11/2008  . ANGINA, UNSTABLE 12/11/2008  . Coronary atherosclerosis 12/11/2008  . SUPRAVENTRICULAR TACHYCARDIA 12/11/2008  . Cerebral artery occlusion with cerebral infarction 12/11/2008  . Disorder resulting from impaired renal function 12/11/2008  . CAROTID BRUIT 12/11/2008  . HYPERGLYCEMIA 12/11/2008    Wynelle Fanny, PTA 01/30/2015, 12:06 PM  [facility] [address], [place], [address] Phone: [phone]   Fax:  [phone]

## 2015-01-31 ENCOUNTER — Encounter: Payer: Self-pay | Admitting: Internal Medicine

## 2015-02-01 ENCOUNTER — Ambulatory Visit: Payer: Medicare Other | Admitting: Physical Therapy

## 2015-02-01 ENCOUNTER — Encounter: Payer: Self-pay | Admitting: Physical Therapy

## 2015-02-01 DIAGNOSIS — IMO0002 Reserved for concepts with insufficient information to code with codable children: Secondary | ICD-10-CM

## 2015-02-01 DIAGNOSIS — R270 Ataxia, unspecified: Secondary | ICD-10-CM

## 2015-02-01 NOTE — Therapy (Signed)
[facility] [place], [place], [address] Phone: [phone]   Fax:  [phone]  Physical Therapy Treatment  Patient Details  Name: [name] [name] MRN: [id number] Date of Birth: Apr 02, 1941 Referring Provider:  Stephens Shire, MD  Encounter Date: 02/01/2015      PT End of Session - 02/01/15 1121    Visit Number 10   Number of Visits 18   Date for PT Re-Evaluation 02/26/15   PT Start Time 1115   PT Stop Time 1201   PT Time Calculation (min) 46 min   Activity Tolerance Patient tolerated treatment well   Behavior During Therapy [facility] for tasks assessed/performed      Past Medical History  Diagnosis Date  . SUPRAVENTRICULAR TACHYCARDIA   . MYOCARDIAL INFARCTION     x 2  . HYPERTENSION   . HYPERLIPIDEMIA   . CEREBROVASCULAR DISEASE   . CEREBROVASCULAR ACCIDENT   . CAD   . RENAL INSUFFICIENCY   . DIABETES MELLITUS   . Cancer     skin    Past Surgical History  Procedure Laterality Date  . Coronary stent placement  2002, 2003    x 4  . Loop recorder implant N/A 10/27/2014    Procedure: LOOP RECORDER IMPLANT;  Surgeon: Deboraha Sprang, MD;  Location: [facility] CATH LAB;  Service: Cardiovascular;  Laterality: N/A;  . Tee without cardioversion N/A 10/27/2014    Procedure: TRANSESOPHAGEAL ECHOCARDIOGRAM (TEE);  Surgeon: Dorothy Spark, MD;  Location: [place];  Service: Cardiovascular;  Laterality: N/A;    There were no vitals filed for this visit.  Visit Diagnosis:  Ataxia  Weakness due to cerebrovascular accident      Subjective Assessment - 02/01/15 1121    Subjective Reports only pain in R shoulder at night.   Limitations Walking   How long can you walk comfortably? Patient has a staggered gait.  Told him with wife present to use a cane at all times for safety.  I states he has not fallen.   Currently in Pain? No/denies            [facility] PT Assessment - 02/01/15 0001    Assessment   Medical Diagnosis  Stroke   Onset Date/Surgical Date 10/31/14                     [facility] Adult PT Treatment/Exercise - 02/01/15 0001    Knee/Hip Exercises: Aerobic   Stationary Bike NuStep L6 x15 min   Knee/Hip Exercises: Standing   Forward Step Up Both;3 sets;10 reps;Step Height: 6"   Rocker Board 3 minutes   Other Standing Knee Exercises BLE toe taps x30 reps to 6" step   Other Standing Knee Exercises BLE marching x30 reps   Hand Exercises   Theraputty - Roll 3 min; yellow putty   Theraputty - Grip 3 min; yellow putty   Theraputty - Pinch 3 min; yellow putty   Digiticizer Red digitizer x3 min grip   Other Hand Exercises R wrist flex/ext/supination/pronation 1# x30 reps each   Other Hand Exercises RUE ball squeeze x3 min                  PT Short Term Goals - 02/01/15 1121    PT SHORT TERM GOAL #1   Title Ind with initial HEP.   Time 2   Period Weeks   Status Achieved           PT Long Term Goals -  02/01/15 1121    PT LONG TERM GOAL #1   Title Ind with advanced HEP.   Time 8   Period Weeks   Status On-going   PT LONG TERM GOAL #2   Title Right grip increased to 35-40#.   Time 8   Period Weeks   Status On-going  5#   PT LONG TERM GOAL #3   Title Patient perform 20 minutes of aerobic activiity with only one rest.   Time 8   Period Weeks   Status On-going   PT LONG TERM GOAL #4   Title restore normal right UE proprioception.   Time 8   Period Weeks   Status On-going   PT LONG TERM GOAL #5   Title Patient walk in clinic 500 feet without ataxia and supervision only.   Time 8   Period Weeks   Status On-going               Plan - 02/01/15 1202    Clinical Impression Statement Patient tolerated treatment well today without complaint of pain. Continues to demonstrate difficulty with mobility of the R hand with grip and finger movement. LT goals are considered on-going at this time due to decrease grip strength, proprioception. Continues to ambulate  with short stride lengths, decreased BLE hip and knee flexion during therapy. Denied pain following treatment.   Pt will benefit from skilled therapeutic intervention in order to improve on the following deficits Decreased activity tolerance;Abnormal gait;Decreased coordination;Decreased endurance;Decreased balance;Decreased strength   Rehab Potential Good   PT Frequency 3x / week   PT Duration 8 weeks   PT Treatment/Interventions ADLs/Self Care Home Management;Patient/family education;Therapeutic exercise;Balance training;Neuromuscular re-education   PT Next Visit Plan Continue per PT POC with LE strength and  Rt UE proprioception/strength exercises next treatment. Grip strength and digit opposition   PT Home Exercise Plan issue initial HEP   Consulted and Agree with Plan of Care Patient        Problem List Patient Active Problem List   Diagnosis Date Noted  . Cerebral infarction due to embolism of left middle cerebral artery 12/15/2014  . Paroxysmal atrial fibrillation 12/15/2014  . CKD (chronic kidney disease) 12/15/2014  . Hyperlipidemia 12/15/2014  . Type 2 diabetes mellitus with other circulatory complications 76/73/4193  . B12 deficiency anemia 12/10/2014  . Right hemiparesis 10/26/2014  . Diabetes mellitus 10/26/2014  . Essential hypertension   . HLD (hyperlipidemia)   . Stroke 10/25/2014  . CEREBROVASCULAR DISEASE 12/31/2009  . MURMUR 12/12/2008  . MYOCARDIAL INFARCTION 12/11/2008  . ANGINA, UNSTABLE 12/11/2008  . Coronary atherosclerosis 12/11/2008  . SUPRAVENTRICULAR TACHYCARDIA 12/11/2008  . Cerebral artery occlusion with cerebral infarction 12/11/2008  . Disorder resulting from impaired renal function 12/11/2008  . CAROTID BRUIT 12/11/2008  . HYPERGLYCEMIA 12/11/2008    Ahmed Prima, PTA 02/01/2015 12:07 PM   [facility] [address] [place], [place], [address] Phone: [phone]   Fax:  [phone]

## 2015-02-05 ENCOUNTER — Ambulatory Visit: Payer: Medicare Other | Admitting: *Deleted

## 2015-02-05 DIAGNOSIS — IMO0002 Reserved for concepts with insufficient information to code with codable children: Secondary | ICD-10-CM

## 2015-02-05 DIAGNOSIS — R270 Ataxia, unspecified: Secondary | ICD-10-CM

## 2015-02-05 NOTE — Therapy (Signed)
[facility] [place], [place], [address] Phone: [phone]   Fax:  [phone]  Physical Therapy Treatment  Patient Details  Name: [name] [name] MRN: [id number] Date of Birth: April 01, 1941 Referring Provider:  Stephens Shire, MD  Encounter Date: 02/05/2015      PT End of Session - 02/05/15 1255    Visit Number 11   Number of Visits 18   Date for PT Re-Evaluation 02/26/15   PT Start Time 1115   PT Stop Time 1206   PT Time Calculation (min) 51 min      Past Medical History  Diagnosis Date  . SUPRAVENTRICULAR TACHYCARDIA   . MYOCARDIAL INFARCTION     x 2  . HYPERTENSION   . HYPERLIPIDEMIA   . CEREBROVASCULAR DISEASE   . CEREBROVASCULAR ACCIDENT   . CAD   . RENAL INSUFFICIENCY   . DIABETES MELLITUS   . Cancer     skin    Past Surgical History  Procedure Laterality Date  . Coronary stent placement  2002, 2003    x 4  . Loop recorder implant N/A 10/27/2014    Procedure: LOOP RECORDER IMPLANT;  Surgeon: Deboraha Sprang, MD;  Location: [facility] CATH LAB;  Service: Cardiovascular;  Laterality: N/A;  . Tee without cardioversion N/A 10/27/2014    Procedure: TRANSESOPHAGEAL ECHOCARDIOGRAM (TEE);  Surgeon: Dorothy Spark, MD;  Location: [place];  Service: Cardiovascular;  Laterality: N/A;    There were no vitals filed for this visit.  Visit Diagnosis:  Weakness due to cerebrovascular accident  Ataxia                       [facility] Adult PT Treatment/Exercise - 02/05/15 0001    Exercises   Exercises Shoulder;Hand;Knee/Hip;Ankle   Knee/Hip Exercises: Aerobic   Stationary Bike NuStep L6 x15 min   Knee/Hip Exercises: Standing   Rocker Board 5 minutes  PF/DF calf stretching, and balance with SBA   Other Standing Knee Exercises BLE toe taps x30 reps to 6" step with SBA and CGA   Shoulder Exercises: Seated   Other Seated Exercises Cone stacking x 3 mins   Hand Exercises   Other Hand Exercises RUE  ball squeeze, Ball squeeze with both hands with elevation x 30min   Manual Therapy   Manual Therapy Passive ROM   Passive ROM PROM to all digits and wrist for Mobilty                  PT Short Term Goals - 02/01/15 1121    PT SHORT TERM GOAL #1   Title Ind with initial HEP.   Time 2   Period Weeks   Status Achieved           PT Long Term Goals - 02/01/15 1121    PT LONG TERM GOAL #1   Title Ind with advanced HEP.   Time 8   Period Weeks   Status On-going   PT LONG TERM GOAL #2   Title Right grip increased to 35-40#.   Time 8   Period Weeks   Status On-going  5#   PT LONG TERM GOAL #3   Title Patient perform 20 minutes of aerobic activiity with only one rest.   Time 8   Period Weeks   Status On-going   PT LONG TERM GOAL #4   Title restore normal right UE proprioception.   Time 8   Period Weeks   Status On-going  PT LONG TERM GOAL #5   Title Patient walk in clinic 500 feet without ataxia and supervision only.   Time 8   Period Weeks   Status On-going               Plan - 02/05/15 1426    Clinical Impression Statement Pt did fairly well with Rx today and feels that his balane has improved, but still c/o numbness/stiffness and weakness of RT hand he did well with hand acts, but had difficulty with velcro board acts due to lack of grip strength. Goals are ongoing   Pt will benefit from skilled therapeutic intervention in order to improve on the following deficits Decreased activity tolerance;Abnormal gait;Decreased coordination;Decreased endurance;Decreased balance;Decreased strength   PT Frequency 3x / week   PT Duration 8 weeks   PT Treatment/Interventions ADLs/Self Care Home Management;Patient/family education;Therapeutic exercise;Balance training;Neuromuscular re-education   PT Next Visit Plan Continue per PT POC with LE strength and  Rt UE proprioception/strength exercises next treatment. Grip strength and digit opposition        Problem  List Patient Active Problem List   Diagnosis Date Noted  . Cerebral infarction due to embolism of left middle cerebral artery 12/15/2014  . Paroxysmal atrial fibrillation 12/15/2014  . CKD (chronic kidney disease) 12/15/2014  . Hyperlipidemia 12/15/2014  . Type 2 diabetes mellitus with other circulatory complications 61/44/3154  . B12 deficiency anemia 12/10/2014  . Right hemiparesis 10/26/2014  . Diabetes mellitus 10/26/2014  . Essential hypertension   . HLD (hyperlipidemia)   . Stroke 10/25/2014  . CEREBROVASCULAR DISEASE 12/31/2009  . MURMUR 12/12/2008  . MYOCARDIAL INFARCTION 12/11/2008  . ANGINA, UNSTABLE 12/11/2008  . Coronary atherosclerosis 12/11/2008  . SUPRAVENTRICULAR TACHYCARDIA 12/11/2008  . Cerebral artery occlusion with cerebral infarction 12/11/2008  . Disorder resulting from impaired renal function 12/11/2008  . CAROTID BRUIT 12/11/2008  . HYPERGLYCEMIA 12/11/2008    [name] [name],[name], PTA 02/05/2015, 3:18 PM  [facility] [address] [place], [place], [address] Phone: [phone]   Fax:  [phone]

## 2015-02-08 ENCOUNTER — Ambulatory Visit: Payer: Medicare Other | Admitting: *Deleted

## 2015-02-08 DIAGNOSIS — IMO0002 Reserved for concepts with insufficient information to code with codable children: Secondary | ICD-10-CM

## 2015-02-08 DIAGNOSIS — R270 Ataxia, unspecified: Secondary | ICD-10-CM | POA: Diagnosis not present

## 2015-02-08 NOTE — Therapy (Signed)
[facility] [place], [place], [address] Phone: [phone]   Fax:  [phone]  Physical Therapy Treatment  Patient Details  Name: [name] [name] MRN: [id number] Date of Birth: [birth year] Referring Provider:  Stephens Shire, MD  Encounter Date: 02/08/2015      PT End of Session - 02/08/15 1549    Visit Number 12   Number of Visits 18   Date for PT Re-Evaluation 02/26/15   PT Start Time 1115   PT Stop Time 1205   PT Time Calculation (min) 50 min      Past Medical History  Diagnosis Date  . SUPRAVENTRICULAR TACHYCARDIA   . MYOCARDIAL INFARCTION     x 2  . HYPERTENSION   . HYPERLIPIDEMIA   . CEREBROVASCULAR DISEASE   . CEREBROVASCULAR ACCIDENT   . CAD   . RENAL INSUFFICIENCY   . DIABETES MELLITUS   . Cancer     skin    Past Surgical History  Procedure Laterality Date  . Coronary stent placement  2002, 2003    x 4  . Loop recorder implant N/A 10/27/2014    Procedure: LOOP RECORDER IMPLANT;  Surgeon: Deboraha Sprang, MD;  Location: [facility] CATH LAB;  Service: Cardiovascular;  Laterality: N/A;  . Tee without cardioversion N/A 10/27/2014    Procedure: TRANSESOPHAGEAL ECHOCARDIOGRAM (TEE);  Surgeon: Dorothy Spark, MD;  Location: [facility];  Service: Cardiovascular;  Laterality: N/A;    There were no vitals filed for this visit.  Visit Diagnosis:  Weakness due to cerebrovascular accident  Ataxia      Subjective Assessment - 02/08/15 1137    Subjective Doing better. I can get up easier now than I could before. RT hand is still weak   Limitations Walking   How long can you walk comfortably? Patient has a staggered gait.  Told him with wife present to use a cane at all times for safety.  I states he has not fallen.            [facility] PT Assessment - 02/08/15 0001    Strength   Overall Strength Deficits   Overall Strength Comments right grip 10#                     [facility] Adult PT  Treatment/Exercise - 02/08/15 0001    Exercises   Exercises Shoulder;Hand;Knee/Hip;Ankle   Knee/Hip Exercises: Aerobic   Stationary Bike NuStep L6 x15 min with UE and LE   Knee/Hip Exercises: Standing   Forward Step Up Both;3 sets;10 reps;Step Height: 6"   Rocker Board 5 minutes  PF/DF calf stretching, and balance with SBA   Gait Training Gait x 500 ft. in clinic with supervision only.   Other Standing Knee Exercises BLE toe taps x30 reps to 6" step with SBA and CGA   Shoulder Exercises: Standing   Other Standing Exercises UE Ranger elevation, circles each way 3x10   Other Standing Exercises proprioception drills with pt reaching and touching with RT UE various point indicated by PTA                  PT Short Term Goals - 02/01/15 1121    PT SHORT TERM GOAL #1   Title Ind with initial HEP.   Time 2   Period Weeks   Status Achieved           PT Long Term Goals - 02/08/15 1152    PT LONG TERM GOAL #1  Title Ind with advanced HEP.   Time 8   Status On-going   PT LONG TERM GOAL #2   Title Right grip increased to 35-40#.   Time 8   Period Weeks   Status On-going   PT LONG TERM GOAL #3   Title Patient perform 20 minutes of aerobic activiity with only one rest.   Time 8   Period Weeks   Status On-going   PT LONG TERM GOAL #4   Title restore normal right UE proprioception.   Time 8   Status On-going   PT LONG TERM GOAL #5   Title Patient walk in clinic 500 feet without ataxia and supervision only.   Period Weeks   Status Achieved               Plan - 02/08/15 1535    Clinical Impression Statement Pt did better today with gait, balance and proprioception exs. He was able to improve grip strength to 10#s RT hand from 5#s on eval. He was able to meet LTG for gait in clinic, but not others yet due to weakness     Pt will benefit from skilled therapeutic intervention in order to improve on the following deficits Decreased activity tolerance;Abnormal  gait;Decreased coordination;Decreased endurance;Decreased balance;Decreased strength   PT Frequency 3x / week   PT Duration 8 weeks   PT Treatment/Interventions ADLs/Self Care Home Management;Patient/family education;Therapeutic exercise;Balance training;Neuromuscular re-education   PT Next Visit Plan Continue per PT POC with LE strength and  Rt UE proprioception/strength exercises next treatment. Grip strength and digit opposition   PT Home Exercise Plan issue initial HEP   Consulted and Agree with Plan of Care Patient        Problem List Patient Active Problem List   Diagnosis Date Noted  . Cerebral infarction due to embolism of left middle cerebral artery 12/15/2014  . Paroxysmal atrial fibrillation 12/15/2014  . CKD (chronic kidney disease) 12/15/2014  . Hyperlipidemia 12/15/2014  . Type 2 diabetes mellitus with other circulatory complications 33/83/2919  . B12 deficiency anemia 12/10/2014  . Right hemiparesis 10/26/2014  . Diabetes mellitus 10/26/2014  . Essential hypertension   . HLD (hyperlipidemia)   . Stroke 10/25/2014  . CEREBROVASCULAR DISEASE 12/31/2009  . MURMUR 12/12/2008  . MYOCARDIAL INFARCTION 12/11/2008  . ANGINA, UNSTABLE 12/11/2008  . Coronary atherosclerosis 12/11/2008  . SUPRAVENTRICULAR TACHYCARDIA 12/11/2008  . Cerebral artery occlusion with cerebral infarction 12/11/2008  . Disorder resulting from impaired renal function 12/11/2008  . CAROTID BRUIT 12/11/2008  . HYPERGLYCEMIA 12/11/2008    RAMSEUR,[name], PTA 02/08/2015, 3:49 PM  [facility] [address] [place], [place], [address] Phone: [phone]   Fax:  [phone]

## 2015-02-13 ENCOUNTER — Encounter: Payer: Self-pay | Admitting: Physical Therapy

## 2015-02-13 ENCOUNTER — Ambulatory Visit: Payer: Medicare Other | Admitting: Physical Therapy

## 2015-02-13 DIAGNOSIS — IMO0002 Reserved for concepts with insufficient information to code with codable children: Secondary | ICD-10-CM

## 2015-02-13 DIAGNOSIS — R270 Ataxia, unspecified: Secondary | ICD-10-CM | POA: Diagnosis not present

## 2015-02-13 NOTE — Therapy (Signed)
[facility] [place], [place], [address] Phone: [phone]   Fax:  [phone]  Physical Therapy Treatment  Patient Details  Name: [name] [name] MRN: [id number] Date of Birth: December 16, 1940 Referring Provider:  Stephens Shire, MD  Encounter Date: 02/13/2015      PT End of Session - 02/13/15 1042    Visit Number 13   Number of Visits 18   Date for PT Re-Evaluation 02/26/15   PT Start Time 1003   PT Stop Time 1043   PT Time Calculation (min) 40 min   Activity Tolerance Patient tolerated treatment well   Behavior During Therapy [facility] for tasks assessed/performed      Past Medical History  Diagnosis Date  . SUPRAVENTRICULAR TACHYCARDIA   . MYOCARDIAL INFARCTION     x 2  . HYPERTENSION   . HYPERLIPIDEMIA   . CEREBROVASCULAR DISEASE   . CEREBROVASCULAR ACCIDENT   . CAD   . RENAL INSUFFICIENCY   . DIABETES MELLITUS   . Cancer     skin    Past Surgical History  Procedure Laterality Date  . Coronary stent placement  2002, 2003    x 4  . Loop recorder implant N/A 10/27/2014    Procedure: LOOP RECORDER IMPLANT;  Surgeon: Deboraha Sprang, MD;  Location: [facility] CATH LAB;  Service: Cardiovascular;  Laterality: N/A;  . Tee without cardioversion N/A 10/27/2014    Procedure: TRANSESOPHAGEAL ECHOCARDIOGRAM (TEE);  Surgeon: Dorothy Spark, MD;  Location: [place];  Service: Cardiovascular;  Laterality: N/A;    There were no vitals filed for this visit.  Visit Diagnosis:  Weakness due to cerebrovascular accident  Ataxia      Subjective Assessment - 02/13/15 1007    Subjective Patient reported feeling very weak yesterday and today for unknow reason   Limitations Walking   How long can you walk comfortably? Patient has a staggered gait.  Told him with wife present to use a cane at all times for safety.  I states he has not fallen.   Currently in Pain? No/denies                         [facility] Adult PT  Treatment/Exercise - 02/13/15 0001    Knee/Hip Exercises: Aerobic   Stationary Bike NuStep L4 x15 min with UE/ LE   Knee/Hip Exercises: Standing   Forward Step Up Both;3 sets;10 reps;Step Height: 6"   Rocker Board 3 minutes   Other Standing Knee Exercises BLE toe taps x30 reps to 6" step with SBA and CGA   Shoulder Exercises: Standing   Other Standing Exercises proprioception drills with pt reaching and touching with RT UE various point indicated by PTA   Hand Exercises   Other Hand Exercises Digiflex squeeze x 94min   Other Hand Exercises ball roll with medium pressure x74min                  PT Short Term Goals - 02/01/15 1121    PT SHORT TERM GOAL #1   Title Ind with initial HEP.   Time 2   Period Weeks   Status Achieved           PT Long Term Goals - 02/13/15 1047    PT LONG TERM GOAL #1   Title Ind with advanced HEP.   Time 8   Status On-going   PT LONG TERM GOAL #2   Title Right grip increased to 35-40#.  Time 8   Period Weeks   Status On-going  10#   PT LONG TERM GOAL #3   Title Patient perform 20 minutes of aerobic activiity with only one rest.   Time 8   Period Weeks   Status On-going  68min thus far   PT LONG TERM GOAL #4   Title restore normal right UE proprioception.   Time 8   Period Weeks   Status Achieved  normal per MPT   PT LONG TERM GOAL #5   Title Patient walk in clinic 500 feet without ataxia and supervision only.   Time 8   Period Weeks   Status Achieved               Plan - 02/13/15 1043    Clinical Impression Statement Patient tolerated treatment very well today. Patient was able to perform close to 50 min of aerobic activity with no rest breaks, patient was also able to perform normal proprioception with Right UE according to MPT today. Able to meet LTG#4 today, other goals ongoing due to grip strength limitations and activity tolerance limitations. Right Grip strength 10# today   Pt will benefit from skilled  therapeutic intervention in order to improve on the following deficits Decreased activity tolerance;Abnormal gait;Decreased coordination;Decreased endurance;Decreased balance;Decreased strength   Rehab Potential Good   PT Frequency 3x / week   PT Duration 8 weeks   PT Treatment/Interventions ADLs/Self Care Home Management;Patient/family education;Therapeutic exercise;Balance training;Neuromuscular re-education   PT Next Visit Plan Continue per PT POC with LE strength and  Rt UE proprioception/strength exercises next treatment. Grip strength and digit opposition   Consulted and Agree with Plan of Care Patient        Problem List Patient Active Problem List   Diagnosis Date Noted  . Cerebral infarction due to embolism of left middle cerebral artery 12/15/2014  . Paroxysmal atrial fibrillation 12/15/2014  . CKD (chronic kidney disease) 12/15/2014  . Hyperlipidemia 12/15/2014  . Type 2 diabetes mellitus with other circulatory complications 24/58/0998  . B12 deficiency anemia 12/10/2014  . Right hemiparesis 10/26/2014  . Diabetes mellitus 10/26/2014  . Essential hypertension   . HLD (hyperlipidemia)   . Stroke 10/25/2014  . CEREBROVASCULAR DISEASE 12/31/2009  . MURMUR 12/12/2008  . MYOCARDIAL INFARCTION 12/11/2008  . ANGINA, UNSTABLE 12/11/2008  . Coronary atherosclerosis 12/11/2008  . SUPRAVENTRICULAR TACHYCARDIA 12/11/2008  . Cerebral artery occlusion with cerebral infarction 12/11/2008  . Disorder resulting from impaired renal function 12/11/2008  . CAROTID BRUIT 12/11/2008  . HYPERGLYCEMIA 12/11/2008    Hamzah Savoca P, PTA 02/13/2015, 10:50 AM  [facility] [address] [place], [place], [address] Phone: [phone]   Fax:  [phone]

## 2015-02-15 ENCOUNTER — Ambulatory Visit: Payer: Medicare Other | Admitting: Physical Therapy

## 2015-02-15 ENCOUNTER — Encounter: Payer: Self-pay | Admitting: Physical Therapy

## 2015-02-15 DIAGNOSIS — R270 Ataxia, unspecified: Secondary | ICD-10-CM

## 2015-02-15 DIAGNOSIS — IMO0002 Reserved for concepts with insufficient information to code with codable children: Secondary | ICD-10-CM

## 2015-02-15 NOTE — Therapy (Signed)
[facility] [place], [place], [address] Phone: [phone]   Fax:  [phone]  Physical Therapy Treatment  Patient Details  Name: [name] [name] MRN: [id number] Date of Birth: 1941/01/26 Referring Provider:  Stephens Shire, MD  Encounter Date: 02/15/2015      PT End of Session - 02/15/15 1100    Visit Number 14   Number of Visits 18   Date for PT Re-Evaluation 02/26/15   PT Start Time 1029   PT Stop Time 1110   PT Time Calculation (min) 41 min   Activity Tolerance Patient tolerated treatment well   Behavior During Therapy [facility] for tasks assessed/performed      Past Medical History  Diagnosis Date  . SUPRAVENTRICULAR TACHYCARDIA   . MYOCARDIAL INFARCTION     x 2  . HYPERTENSION   . HYPERLIPIDEMIA   . CEREBROVASCULAR DISEASE   . CEREBROVASCULAR ACCIDENT   . CAD   . RENAL INSUFFICIENCY   . DIABETES MELLITUS   . Cancer     skin    Past Surgical History  Procedure Laterality Date  . Coronary stent placement  2002, 2003    x 4  . Loop recorder implant N/A 10/27/2014    Procedure: LOOP RECORDER IMPLANT;  Surgeon: Deboraha Sprang, MD;  Location: [facility] CATH LAB;  Service: Cardiovascular;  Laterality: N/A;  . Tee without cardioversion N/A 10/27/2014    Procedure: TRANSESOPHAGEAL ECHOCARDIOGRAM (TEE);  Surgeon: Dorothy Spark, MD;  Location: [place];  Service: Cardiovascular;  Laterality: N/A;    There were no vitals filed for this visit.  Visit Diagnosis:  Weakness due to cerebrovascular accident  Ataxia      Subjective Assessment - 02/15/15 1036    Subjective Patient still reports feeling weak today for unkown reason   Limitations Walking   How long can you walk comfortably? Patient has a staggered gait.  Told him with wife present to use a cane at all times for safety.  I states he has not fallen.   Currently in Pain? No/denies                         [facility] Adult PT  Treatment/Exercise - 02/15/15 0001    Knee/Hip Exercises: Aerobic   Stationary Bike NuStep L4 x15 min with UE/ LE, monitored for progression   Knee/Hip Exercises: Standing   Forward Step Up Both;3 sets;10 reps;Step Height: 6"   Rocker Board 5 minutes   Other Standing Knee Exercises BLE toe taps x30 reps to 6" step with SBA and CGA   Hand Exercises   Opposition Right;10 reps   Thumb Opposition Right x10   Other Hand Exercises Digiflex squeeze x 4mn (x2)   Other Hand Exercises ball roll with medium pressure x233m (x2)                  PT Short Term Goals - 02/01/15 1121    PT SHORT TERM GOAL #1   Title Ind with initial HEP.   Time 2   Period Weeks   Status Achieved           PT Long Term Goals - 02/13/15 1047    PT LONG TERM GOAL #1   Title Ind with advanced HEP.   Time 8   Status On-going   PT LONG TERM GOAL #2   Title Right grip increased to 35-40#.   Time 8   Period Weeks   Status  On-going  10#   PT LONG TERM GOAL #3   Title Patient perform 20 minutes of aerobic activiity with only one rest.   Time 8   Period Weeks   Status On-going  27mn thus far   PT LONG TERM GOAL #4   Title restore normal right UE proprioception.   Time 8   Period Weeks   Status Achieved  normal per MPT   PT LONG TERM GOAL #5   Title Patient walk in clinic 500 feet without ataxia and supervision only.   Time 8   Period Weeks   Status Achieved               Plan - 02/15/15 1101    Clinical Impression Statement Patient progressing with all activities and did not require a rest break today. Is progressing with hand grip exercises yet has not met goal at this time. May progress with increased time for endurance on nustep if patient can tolerate.   Pt will benefit from skilled therapeutic intervention in order to improve on the following deficits Decreased activity tolerance;Abnormal gait;Decreased coordination;Decreased endurance;Decreased balance;Decreased strength    Rehab Potential Good   PT Frequency 3x / week   PT Duration 8 weeks   PT Treatment/Interventions ADLs/Self Care Home Management;Patient/family education;Therapeutic exercise;Balance training;Neuromuscular re-education   PT Next Visit Plan Continue per PT POC with LE strength and  Rt UE proprioception/strength exercises next treatment. Grip strength and digit opposition        Problem List Patient Active Problem List   Diagnosis Date Noted  . Cerebral infarction due to embolism of left middle cerebral artery 12/15/2014  . Paroxysmal atrial fibrillation 12/15/2014  . CKD (chronic kidney disease) 12/15/2014  . Hyperlipidemia 12/15/2014  . Type 2 diabetes mellitus with other circulatory complications 015/72/6203 . B12 deficiency anemia 12/10/2014  . Right hemiparesis 10/26/2014  . Diabetes mellitus 10/26/2014  . Essential hypertension   . HLD (hyperlipidemia)   . Stroke 10/25/2014  . CEREBROVASCULAR DISEASE 12/31/2009  . MURMUR 12/12/2008  . MYOCARDIAL INFARCTION 12/11/2008  . ANGINA, UNSTABLE 12/11/2008  . Coronary atherosclerosis 12/11/2008  . SUPRAVENTRICULAR TACHYCARDIA 12/11/2008  . Cerebral artery occlusion with cerebral infarction 12/11/2008  . Disorder resulting from impaired renal function 12/11/2008  . CAROTID BRUIT 12/11/2008  . HYPERGLYCEMIA 12/11/2008    DUNFORD, CHRISTINA P, PTA 02/15/2015, 11:11 AM  [facility] [address]Phone: [phone]  Fax:  [phone]

## 2015-02-19 ENCOUNTER — Ambulatory Visit: Payer: Medicare Other | Admitting: Physical Therapy

## 2015-02-19 ENCOUNTER — Encounter: Payer: Self-pay | Admitting: Physical Therapy

## 2015-02-19 DIAGNOSIS — R270 Ataxia, unspecified: Secondary | ICD-10-CM

## 2015-02-19 DIAGNOSIS — IMO0002 Reserved for concepts with insufficient information to code with codable children: Secondary | ICD-10-CM

## 2015-02-19 NOTE — Therapy (Signed)
[name] [name], [place], [address] Phone: [phone]   Fax:  [phone]  Physical Therapy Treatment  Patient Details  Name: [name] [name] MRN: [id number] Date of Birth: 1941/05/10 Referring Provider:  Stephens Shire, MD  Encounter Date: 02/19/2015      PT End of Session - 02/19/15 1102    Visit Number 15   Number of Visits 18   Date for PT Re-Evaluation 02/26/15   PT Start Time 1030   PT Stop Time 1114   PT Time Calculation (min) 44 min   Activity Tolerance Patient tolerated treatment well   Behavior During Therapy [facility] for tasks assessed/performed      Past Medical History  Diagnosis Date  . SUPRAVENTRICULAR TACHYCARDIA   . MYOCARDIAL INFARCTION     x 2  . HYPERTENSION   . HYPERLIPIDEMIA   . CEREBROVASCULAR DISEASE   . CEREBROVASCULAR ACCIDENT   . CAD   . RENAL INSUFFICIENCY   . DIABETES MELLITUS   . Cancer     skin    Past Surgical History  Procedure Laterality Date  . Coronary stent placement  2002, 2003    x 4  . Loop recorder implant N/A 10/27/2014    Procedure: LOOP RECORDER IMPLANT;  Surgeon: Deboraha Sprang, MD;  Location: [facility] CATH LAB;  Service: Cardiovascular;  Laterality: N/A;  . Tee without cardioversion N/A 10/27/2014    Procedure: TRANSESOPHAGEAL ECHOCARDIOGRAM (TEE);  Surgeon: Dorothy Spark, MD;  Location: [place];  Service: Cardiovascular;  Laterality: N/A;    There were no vitals filed for this visit.  Visit Diagnosis:  Weakness due to cerebrovascular accident  Ataxia      Subjective Assessment - 02/19/15 1037    Subjective Feeling some better per patient   Limitations Walking   How long can you walk comfortably? Patient has a staggered gait.  Told him with wife present to use a cane at all times for safety.  I states he has not fallen.   Currently in Pain? No/denies            [facility] PT Assessment - 02/19/15 0001    Strength   Overall Strength Deficits   Overall Strength Comments right grip 12#                     [facility] Adult PT Treatment/Exercise - 02/19/15 0001    Knee/Hip Exercises: Aerobic   Stationary Bike NuStep L5 x20 min with UE/ LE, monitored for progression   Knee/Hip Exercises: Standing   Forward Step Up Both;3 sets;10 reps;Step Height: 6"   Rocker Board 5 minutes   Other Standing Knee Exercises BLE toe taps x30 reps to 6" step with SBA and CGA   Hand Exercises   Opposition Right;10 reps   Thumb Opposition Right x10   Other Hand Exercises Digiflex squeeze x 24mn (x2)   Other Hand Exercises ball roll with medium pressure x261m (x2)                  PT Short Term Goals - 02/01/15 1121    PT SHORT TERM GOAL #1   Title Ind with initial HEP.   Time 2   Period Weeks   Status Achieved           PT Long Term Goals - 02/19/15 1103    PT LONG TERM GOAL #1   Title Ind with advanced HEP.   Time 8   Period Weeks  Status On-going   PT LONG TERM GOAL #2   Title Right grip increased to 35-40#.   Time 8   Period Weeks   Status On-going   PT LONG TERM GOAL #3   Title Patient perform 20 minutes of aerobic activiity with only one rest.   Time 8   Period Weeks   Status Achieved  79mn with no rest required   PT LONG TERM GOAL #4   Title restore normal right UE proprioception.   Time 8   Period Weeks   Status Achieved   PT LONG TERM GOAL #5   Title Patient walk in clinic 500 feet without ataxia and supervision only.   Time 8   Period Weeks   Status Achieved               Plan - 02/19/15 1105    Clinical Impression Statement Patient progressing with all activities. Patient has improved grip strength by 2# (12#). Patient was able to perform 20 min of aerobic activity with no rest breaks and met LTG#3 today. other goal ongoing due to grip limitation today.   Pt will benefit from skilled therapeutic intervention in order to improve on the following deficits Decreased activity  tolerance;Abnormal gait;Decreased coordination;Decreased endurance;Decreased balance;Decreased strength   Rehab Potential Good   PT Frequency 3x / week   PT Duration 8 weeks   PT Treatment/Interventions ADLs/Self Care Home Management;Patient/family education;Therapeutic exercise;Balance training;Neuromuscular re-education   PT Next Visit Plan Continue per PT POC with LE strength and  Rt UE proprioception/strength exercises next treatment. Grip strength and digit opposition   Consulted and Agree with Plan of Care Patient        Problem List Patient Active Problem List   Diagnosis Date Noted  . Cerebral infarction due to embolism of left middle cerebral artery 12/15/2014  . Paroxysmal atrial fibrillation 12/15/2014  . CKD (chronic kidney disease) 12/15/2014  . Hyperlipidemia 12/15/2014  . Type 2 diabetes mellitus with other circulatory complications 016/38/4665 . B12 deficiency anemia 12/10/2014  . Right hemiparesis 10/26/2014  . Diabetes mellitus 10/26/2014  . Essential hypertension   . HLD (hyperlipidemia)   . Stroke 10/25/2014  . CEREBROVASCULAR DISEASE 12/31/2009  . MURMUR 12/12/2008  . MYOCARDIAL INFARCTION 12/11/2008  . ANGINA, UNSTABLE 12/11/2008  . Coronary atherosclerosis 12/11/2008  . SUPRAVENTRICULAR TACHYCARDIA 12/11/2008  . Cerebral artery occlusion with cerebral infarction 12/11/2008  . Disorder resulting from impaired renal function 12/11/2008  . CAROTID BRUIT 12/11/2008  . HYPERGLYCEMIA 12/11/2008    DUNFORD, CHRISTINA P 02/19/2015, 11:17 AM  [facility] [address]Phone: [phone]  Fax:  [phone]

## 2015-02-22 ENCOUNTER — Encounter: Payer: Self-pay | Admitting: Physical Therapy

## 2015-02-22 ENCOUNTER — Ambulatory Visit: Payer: Medicare Other | Admitting: Physical Therapy

## 2015-02-22 DIAGNOSIS — IMO0002 Reserved for concepts with insufficient information to code with codable children: Secondary | ICD-10-CM

## 2015-02-22 DIAGNOSIS — R270 Ataxia, unspecified: Secondary | ICD-10-CM

## 2015-02-22 NOTE — Therapy (Signed)
[facility] [place], [place], [address] Phone: [phone]   Fax:  [phone]  Physical Therapy Treatment  Patient Details  Name: [name] [name] MRN: [id number] Date of Birth: 08/09/1941 Referring Provider:  Stephens Shire, MD  Encounter Date: 02/22/2015      PT End of Session - 02/22/15 1109    Visit Number 16   Number of Visits 18   Date for PT Re-Evaluation 02/26/15   PT Start Time 1030   PT Stop Time 1113   PT Time Calculation (min) 43 min   Activity Tolerance Patient tolerated treatment well      Past Medical History  Diagnosis Date  . SUPRAVENTRICULAR TACHYCARDIA   . MYOCARDIAL INFARCTION     x 2  . HYPERTENSION   . HYPERLIPIDEMIA   . CEREBROVASCULAR DISEASE   . CEREBROVASCULAR ACCIDENT   . CAD   . RENAL INSUFFICIENCY   . DIABETES MELLITUS   . Cancer     skin    Past Surgical History  Procedure Laterality Date  . Coronary stent placement  2002, 2003    x 4  . Loop recorder implant N/A 10/27/2014    Procedure: LOOP RECORDER IMPLANT;  Surgeon: Deboraha Sprang, MD;  Location: [facility] CATH LAB;  Service: Cardiovascular;  Laterality: N/A;  . Tee without cardioversion N/A 10/27/2014    Procedure: TRANSESOPHAGEAL ECHOCARDIOGRAM (TEE);  Surgeon: Dorothy Spark, MD;  Location: [place];  Service: Cardiovascular;  Laterality: N/A;    There were no vitals filed for this visit.  Visit Diagnosis:  Weakness due to cerebrovascular accident  Ataxia      Subjective Assessment - 02/22/15 1103    Subjective Patient able to hold objects better than before   Limitations Walking   Currently in Pain? No/denies            [facility] PT Assessment - 02/22/15 0001    Strength   Overall Strength Deficits   Overall Strength Comments right grip 12#                     [facility] Adult PT Treatment/Exercise - 02/22/15 0001    Knee/Hip Exercises: Aerobic   Stationary Bike NuStep L5 x20 min with UE/ LE,  monitored for progression   Knee/Hip Exercises: Standing   Forward Step Up Both;3 sets;10 reps;Step Height: 6"   Rocker Board 4 minutes   Hand Exercises   Opposition Right;20 reps   Thumb Opposition Right x20   Other Hand Exercises Digiflex squeeze x 54min (x2)   Other Hand Exercises towel squeeze 2x 2min                  PT Short Term Goals - 02/01/15 1121    PT SHORT TERM GOAL #1   Title Ind with initial HEP.   Time 2   Period Weeks   Status Achieved           PT Long Term Goals - 02/19/15 1103    PT LONG TERM GOAL #1   Title Ind with advanced HEP.   Time 8   Period Weeks   Status On-going   PT LONG TERM GOAL #2   Title Right grip increased to 35-40#.   Time 8   Period Weeks   Status On-going   PT LONG TERM GOAL #3   Title Patient perform 20 minutes of aerobic activiity with only one rest.   Time 8   Period Weeks  Status Achieved  25min with no rest required   PT LONG TERM GOAL #4   Title restore normal right UE proprioception.   Time 8   Period Weeks   Status Achieved   PT LONG TERM GOAL #5   Title Patient walk in clinic 500 feet without ataxia and supervision only.   Time 8   Period Weeks   Status Achieved               Plan - 02/22/15 1113    Clinical Impression Statement Patient progressing with activities. Has reported improvement with grip and is able to hold on to objects better per patient. No improvement with grip strength yet is progressing with exercises. Goals ongoing due to limitations with grip at this time.   Pt will benefit from skilled therapeutic intervention in order to improve on the following deficits Decreased activity tolerance;Abnormal gait;Decreased coordination;Decreased endurance;Decreased balance;Decreased strength   Rehab Potential Good   PT Frequency 3x / week   PT Duration 8 weeks   PT Treatment/Interventions ADLs/Self Care Home Management;Patient/family education;Therapeutic exercise;Balance  training;Neuromuscular re-education   PT Next Visit Plan Continue per PT POC with LE strength and  Rt UE proprioception/strength exercises next treatment. Grip strength and digit opposition   Consulted and Agree with Plan of Care Patient        Problem List Patient Active Problem List   Diagnosis Date Noted  . Cerebral infarction due to embolism of left middle cerebral artery 12/15/2014  . Paroxysmal atrial fibrillation 12/15/2014  . CKD (chronic kidney disease) 12/15/2014  . Hyperlipidemia 12/15/2014  . Type 2 diabetes mellitus with other circulatory complications 87/56/4332  . B12 deficiency anemia 12/10/2014  . Right hemiparesis 10/26/2014  . Diabetes mellitus 10/26/2014  . Essential hypertension   . HLD (hyperlipidemia)   . Stroke 10/25/2014  . CEREBROVASCULAR DISEASE 12/31/2009  . MURMUR 12/12/2008  . MYOCARDIAL INFARCTION 12/11/2008  . ANGINA, UNSTABLE 12/11/2008  . Coronary atherosclerosis 12/11/2008  . SUPRAVENTRICULAR TACHYCARDIA 12/11/2008  . Cerebral artery occlusion with cerebral infarction 12/11/2008  . Disorder resulting from impaired renal function 12/11/2008  . CAROTID BRUIT 12/11/2008  . HYPERGLYCEMIA 12/11/2008    Addylin Manke P, PTA 02/22/2015, 11:15 AM  [facility] [address] [place], [place], [address] Phone: [phone]   Fax:  [phone]

## 2015-02-23 ENCOUNTER — Ambulatory Visit (INDEPENDENT_AMBULATORY_CARE_PROVIDER_SITE_OTHER): Payer: Medicare Other | Admitting: *Deleted

## 2015-02-23 DIAGNOSIS — I639 Cerebral infarction, unspecified: Secondary | ICD-10-CM | POA: Diagnosis not present

## 2015-02-23 DIAGNOSIS — I6350 Cerebral infarction due to unspecified occlusion or stenosis of unspecified cerebral artery: Secondary | ICD-10-CM

## 2015-02-24 ENCOUNTER — Encounter: Payer: Self-pay | Admitting: Internal Medicine

## 2015-02-27 ENCOUNTER — Ambulatory Visit: Payer: Medicare Other | Attending: Neurology | Admitting: Physical Therapy

## 2015-02-27 ENCOUNTER — Encounter: Payer: Self-pay | Admitting: Physical Therapy

## 2015-02-27 DIAGNOSIS — R531 Weakness: Secondary | ICD-10-CM | POA: Insufficient documentation

## 2015-02-27 DIAGNOSIS — IMO0002 Reserved for concepts with insufficient information to code with codable children: Secondary | ICD-10-CM

## 2015-02-27 DIAGNOSIS — I69898 Other sequelae of other cerebrovascular disease: Secondary | ICD-10-CM | POA: Diagnosis present

## 2015-02-27 DIAGNOSIS — R270 Ataxia, unspecified: Secondary | ICD-10-CM | POA: Diagnosis present

## 2015-02-27 NOTE — Therapy (Signed)
[facility] [place], [place], [address] Phone: [phone]   Fax:  [phone]  Physical Therapy Treatment  Patient Details  Name: [name] [name] MRN: [id number] Date of Birth: 03/02/1941 Referring Provider:  Stephens Shire, MD  Encounter Date: 02/27/2015      PT End of Session - 02/27/15 1036    Visit Number 17   Number of Visits 18   Date for PT Re-Evaluation 02/26/15   PT Start Time 1030   PT Stop Time 1115   PT Time Calculation (min) 45 min   Activity Tolerance Patient tolerated treatment well   Behavior During Therapy [facility] for tasks assessed/performed      Past Medical History  Diagnosis Date  . SUPRAVENTRICULAR TACHYCARDIA   . MYOCARDIAL INFARCTION     x 2  . HYPERTENSION   . HYPERLIPIDEMIA   . CEREBROVASCULAR DISEASE   . CEREBROVASCULAR ACCIDENT   . CAD   . RENAL INSUFFICIENCY   . DIABETES MELLITUS   . Cancer     skin    Past Surgical History  Procedure Laterality Date  . Coronary stent placement  2002, 2003    x 4  . Loop recorder implant N/A 10/27/2014    Procedure: LOOP RECORDER IMPLANT;  Surgeon: Deboraha Sprang, MD;  Location: [facility] CATH LAB;  Service: Cardiovascular;  Laterality: N/A;  . Tee without cardioversion N/A 10/27/2014    Procedure: TRANSESOPHAGEAL ECHOCARDIOGRAM (TEE);  Surgeon: Dorothy Spark, MD;  Location: [place];  Service: Cardiovascular;  Laterality: N/A;    There were no vitals filed for this visit.  Visit Diagnosis:  Weakness due to cerebrovascular accident  Ataxia      Subjective Assessment - 02/27/15 1035    Subjective Reported not feeling well and that when he returned home from therapy today that he would take a nap.   Limitations Walking   How long can you walk comfortably? Patient has a staggered gait.  Told him with wife present to use a cane at all times for safety.  I states he has not fallen.   Currently in Pain? No/denies            [facility] PT  Assessment - 02/27/15 0001    Assessment   Medical Diagnosis Stroke   Onset Date/Surgical Date 10/31/14                     [facility] Adult PT Treatment/Exercise - 02/27/15 0001    Exercises   Exercises Knee/Hip;Hand   Knee/Hip Exercises: Aerobic   Nustep L6 x20 min   Knee/Hip Exercises: Standing   Hip Flexion AROM;Both;3 sets;10 reps;Knee bent   Forward Step Up Both;3 sets;10 reps;Step Height: 6"   Hand Exercises   Opposition Strengthening;Right;Seated;Other reps (comment);Squeeze ball;Other (comment)  Red web x3 min, ball squeeze x3 min   Other Hand Exercises Red digiflex squeeze x3 min   Other Hand Exercises Towel squeeze x3 min                PT Education - 02/27/15 1100    Education provided Yes   Education Details HEP- standing marching   Person(s) Educated Patient   Methods Explanation;Demonstration;Verbal cues;Handout   Comprehension Verbalized understanding;Returned demonstration;Verbal cues required          PT Short Term Goals - 02/01/15 1121    PT SHORT TERM GOAL #1   Title Ind with initial HEP.   Time 2   Period Weeks   Status  Achieved           PT Long Term Goals - 02/19/15 1103    PT LONG TERM GOAL #1   Title Ind with advanced HEP.   Time 8   Period Weeks   Status On-going   PT LONG TERM GOAL #2   Title Right grip increased to 35-40#.   Time 8   Period Weeks   Status On-going   PT LONG TERM GOAL #3   Title Patient perform 20 minutes of aerobic activiity with only one rest.   Time 8   Period Weeks   Status Achieved  76min with no rest required   PT LONG TERM GOAL #4   Title restore normal right UE proprioception.   Time 8   Period Weeks   Status Achieved   PT LONG TERM GOAL #5   Title Patient walk in clinic 500 feet without ataxia and supervision only.   Time 8   Period Weeks   Status Achieved               Plan - 02/27/15 1118    Clinical Impression Statement Patient tolerated treatment well wihtout  complaints of pain. Was given new HEP and patient accepted without questions. Remaining goal on-going secondary to decreased R grip strength. Denied pain following treatment only fatigue.   Pt will benefit from skilled therapeutic intervention in order to improve on the following deficits Decreased activity tolerance;Abnormal gait;Decreased coordination;Decreased endurance;Decreased balance;Decreased strength   Rehab Potential Good   PT Frequency 3x / week   PT Duration 8 weeks   PT Treatment/Interventions ADLs/Self Care Home Management;Patient/family education;Therapeutic exercise;Balance training;Neuromuscular re-education   PT Next Visit Plan Continue per PT POC with LE strength and  Rt UE proprioception/strength exercises next treatment. Grip strength and digit opposition   Consulted and Agree with Plan of Care Patient        Problem List Patient Active Problem List   Diagnosis Date Noted  . Cerebral infarction due to embolism of left middle cerebral artery 12/15/2014  . Paroxysmal atrial fibrillation 12/15/2014  . CKD (chronic kidney disease) 12/15/2014  . Hyperlipidemia 12/15/2014  . Type 2 diabetes mellitus with other circulatory complications 71/24/5809  . B12 deficiency anemia 12/10/2014  . Right hemiparesis 10/26/2014  . Diabetes mellitus 10/26/2014  . Essential hypertension   . HLD (hyperlipidemia)   . Stroke 10/25/2014  . CEREBROVASCULAR DISEASE 12/31/2009  . MURMUR 12/12/2008  . MYOCARDIAL INFARCTION 12/11/2008  . ANGINA, UNSTABLE 12/11/2008  . Coronary atherosclerosis 12/11/2008  . SUPRAVENTRICULAR TACHYCARDIA 12/11/2008  . Cerebral artery occlusion with cerebral infarction 12/11/2008  . Disorder resulting from impaired renal function 12/11/2008  . CAROTID BRUIT 12/11/2008  . HYPERGLYCEMIA 12/11/2008    Wynelle Fanny, PTA 02/27/2015, 11:21 AM  [facility] [address] [place], [place], [address] Phone: [phone]    Fax:  [phone]

## 2015-02-27 NOTE — Patient Instructions (Signed)
Hip Flexion (Standing)   Stand at the kitchen counter and lift both knees like you are marching like a soldier. Repeat __10__ times per set. Do __3__ sets per session. Do _2-3___ sessions per day.  http://orth.exer.us/700   Copyright  VHI. All rights reserved.

## 2015-02-28 LAB — CUP PACEART REMOTE DEVICE CHECK: Date Time Interrogation Session: 20160706135719

## 2015-02-28 NOTE — Progress Notes (Signed)
Loop recorder 

## 2015-03-01 ENCOUNTER — Encounter: Payer: Self-pay | Admitting: Internal Medicine

## 2015-03-06 ENCOUNTER — Ambulatory Visit: Payer: Medicare Other | Admitting: Physical Therapy

## 2015-03-06 ENCOUNTER — Encounter: Payer: Self-pay | Admitting: Physical Therapy

## 2015-03-06 DIAGNOSIS — R270 Ataxia, unspecified: Secondary | ICD-10-CM

## 2015-03-06 DIAGNOSIS — I69898 Other sequelae of other cerebrovascular disease: Secondary | ICD-10-CM | POA: Diagnosis not present

## 2015-03-06 DIAGNOSIS — IMO0002 Reserved for concepts with insufficient information to code with codable children: Secondary | ICD-10-CM

## 2015-03-06 NOTE — Therapy (Addendum)
[facility] [place], [place], [address] Phone: [phone]   Fax:  [phone]  Physical Therapy Treatment  Patient Details  Name: [name] [name] MRN: [id number] Date of Birth: 12/15/1940 Referring Provider:  Stephens Shire, MD  Encounter Date: 03/06/2015      PT End of Session - 03/06/15 1034    Visit Number 18   Number of Visits 18   Date for PT Re-Evaluation 02/26/15   PT Start Time 1031   PT Stop Time 1115   PT Time Calculation (min) 44 min   Activity Tolerance Patient tolerated treatment well   Behavior During Therapy [facility] for tasks assessed/performed      Past Medical History  Diagnosis Date  . SUPRAVENTRICULAR TACHYCARDIA   . MYOCARDIAL INFARCTION     x 2  . HYPERTENSION   . HYPERLIPIDEMIA   . CEREBROVASCULAR DISEASE   . CEREBROVASCULAR ACCIDENT   . CAD   . RENAL INSUFFICIENCY   . DIABETES MELLITUS   . Cancer     skin    Past Surgical History  Procedure Laterality Date  . Coronary stent placement  2002, 2003    x 4  . Loop recorder implant N/A 10/27/2014    Procedure: LOOP RECORDER IMPLANT;  Surgeon: Deboraha Sprang, MD;  Location: [facility] CATH LAB;  Service: Cardiovascular;  Laterality: N/A;  . Tee without cardioversion N/A 10/27/2014    Procedure: TRANSESOPHAGEAL ECHOCARDIOGRAM (TEE);  Surgeon: Dorothy Spark, MD;  Location: [place];  Service: Cardiovascular;  Laterality: N/A;    There were no vitals filed for this visit.  Visit Diagnosis:  Weakness due to cerebrovascular accident  Ataxia      Subjective Assessment - 03/06/15 1033    Subjective "My brain ain't working today." Reports his R arm is fine but his hand is dead.    How long can you walk comfortably? Patient has a staggered gait.  Told him with wife present to use a cane at all times for safety.  I states he has not fallen.   Currently in Pain? No/denies            [facility] PT Assessment - 03/06/15 0001    Assessment   Medical Diagnosis Stroke   Onset Date/Surgical Date 10/31/14                     [facility] Adult PT Treatment/Exercise - 03/06/15 0001    Exercises   Exercises Hand   Knee/Hip Exercises: Aerobic   Nustep x20 min   Knee/Hip Exercises: Seated   Long Arc Quad Strengthening;Both;3 sets;10 reps;Weights   Long Arc Quad Weight 4 lbs.   Hand Exercises   Opposition Strengthening;Right;Seated;Other (comment)  x3 min with towel   Thumb Opposition --   Other Hand Exercises R red web grip x4 min, R ball squeeze x3 min, towel pinch/grip x3 min each                  PT Short Term Goals - 02/01/15 1121    PT SHORT TERM GOAL #1   Title Ind with initial HEP.   Time 2   Period Weeks   Status Achieved           PT Long Term Goals - 03/06/15 1057    PT LONG TERM GOAL #1   Title Ind with advanced HEP.   Time 8   Period Weeks   Status Achieved   PT LONG TERM GOAL #2  Title Right grip increased to 35-40#.   Time 8   Period Weeks   Status Not Met   PT LONG TERM GOAL #3   Title Patient perform 20 minutes of aerobic activiity with only one rest.   Time 8   Period Weeks   Status Achieved  69min with no rest required   PT LONG TERM GOAL #4   Title restore normal right UE proprioception.   Time 8   Period Weeks   Status Achieved   PT LONG TERM GOAL #5   Title Patient walk in clinic 500 feet without ataxia and supervision only.   Time 8   Period Weeks   Status Achieved               Plan - 03/14/2015 1100    Clinical Impression Statement Patient tolerated treatment well and achieved all but one goal set at evaluation. The only goal not achieved was the grip strength of 35-40#, grip strength was tested today but patient could not move the needle of the hand dynamometer. Continues to have difficulty with finder dexterity for exercises. Denied pain following treatment.   Pt will benefit from skilled therapeutic intervention in order to improve on the following  deficits Decreased activity tolerance;Abnormal gait;Decreased coordination;Decreased endurance;Decreased balance;Decreased strength   Rehab Potential Good   PT Frequency 3x / week   PT Duration 8 weeks   PT Treatment/Interventions ADLs/Self Care Home Management;Patient/family education;Therapeutic exercise;Balance training;Neuromuscular re-education   PT Next Visit Plan Communicate with MPT regarding need of discharge summary and clinical judgement.   Consulted and Agree with Plan of Care Patient          G-Codes - 03-14-15 1317    Functional Assessment Tool Used Clinical judgement.   Functional Limitation Mobility: Walking and moving around   Mobility: Walking and Moving Around Current Status [phone]) At least 40 percent but less than 60 percent impaired, limited or restricted   Mobility: Walking and Moving Around Goal Status [phone]) At least 20 percent but less than 40 percent impaired, limited or restricted   Mobility: Walking and Moving Around Discharge Status [id number]) At least 40 percent but less than 60 percent impaired, limited or restricted      Problem List Patient Active Problem List   Diagnosis Date Noted  . Cerebral infarction due to embolism of left middle cerebral artery 12/15/2014  . Paroxysmal atrial fibrillation 12/15/2014  . CKD (chronic kidney disease) 12/15/2014  . Hyperlipidemia 12/15/2014  . Type 2 diabetes mellitus with other circulatory complications 85/46/2703  . B12 deficiency anemia 12/10/2014  . Right hemiparesis 10/26/2014  . Diabetes mellitus 10/26/2014  . Essential hypertension   . HLD (hyperlipidemia)   . Stroke 10/25/2014  . CEREBROVASCULAR DISEASE 12/31/2009  . MURMUR 12/12/2008  . MYOCARDIAL INFARCTION 12/11/2008  . ANGINA, UNSTABLE 12/11/2008  . Coronary atherosclerosis 12/11/2008  . SUPRAVENTRICULAR TACHYCARDIA 12/11/2008  . Cerebral artery occlusion with cerebral infarction 12/11/2008  . Disorder resulting from impaired renal function  12/11/2008  . CAROTID BRUIT 12/11/2008  . HYPERGLYCEMIA 12/11/2008  PHYSICAL THERAPY DISCHARGE SUMMARY  Visits from Start of Care: 18  Current functional level related to goals / functional outcomes: Please see above.   Remaining deficits: Grip strength goal unmet.   Education / Equipment: HEP. Plan: Patient agrees to discharge.  Patient goals were partially met. Patient is being discharged due to meeting the stated rehab goals.  ?????       Ayvin Lipinski, [place] MPT 2015/03/14, 1:19 PM  [facility] [place], [place], [address] Phone: [phone]   Fax:  [phone]

## 2015-03-06 NOTE — Therapy (Signed)
[facility] [place], [place], [address] Phone: [phone]   Fax:  [phone]  Physical Therapy Treatment  Patient Details  Name: [name] [name] MRN: [id number] Date of Birth: 12/15/1940 Referring Provider:  Stephens Shire, MD  Encounter Date: 03/06/2015      PT End of Session - 03/06/15 1034    Visit Number 18   Number of Visits 18   Date for PT Re-Evaluation 02/26/15   PT Start Time 1031   PT Stop Time 1115   PT Time Calculation (min) 44 min   Activity Tolerance Patient tolerated treatment well   Behavior During Therapy [facility] for tasks assessed/performed      Past Medical History  Diagnosis Date  . SUPRAVENTRICULAR TACHYCARDIA   . MYOCARDIAL INFARCTION     x 2  . HYPERTENSION   . HYPERLIPIDEMIA   . CEREBROVASCULAR DISEASE   . CEREBROVASCULAR ACCIDENT   . CAD   . RENAL INSUFFICIENCY   . DIABETES MELLITUS   . Cancer     skin    Past Surgical History  Procedure Laterality Date  . Coronary stent placement  2002, 2003    x 4  . Loop recorder implant N/A 10/27/2014    Procedure: LOOP RECORDER IMPLANT;  Surgeon: Deboraha Sprang, MD;  Location: [facility] CATH LAB;  Service: Cardiovascular;  Laterality: N/A;  . Tee without cardioversion N/A 10/27/2014    Procedure: TRANSESOPHAGEAL ECHOCARDIOGRAM (TEE);  Surgeon: Dorothy Spark, MD;  Location: [place];  Service: Cardiovascular;  Laterality: N/A;    There were no vitals filed for this visit.  Visit Diagnosis:  Weakness due to cerebrovascular accident  Ataxia      Subjective Assessment - 03/06/15 1033    Subjective "My brain ain't working today." Reports his R arm is fine but his hand is dead.    How long can you walk comfortably? Patient has a staggered gait.  Told him with wife present to use a cane at all times for safety.  I states he has not fallen.   Currently in Pain? No/denies            [facility] PT Assessment - 03/06/15 0001    Assessment   Medical Diagnosis Stroke   Onset Date/Surgical Date 10/31/14                     [facility] Adult PT Treatment/Exercise - 03/06/15 0001    Exercises   Exercises Hand   Knee/Hip Exercises: Aerobic   Nustep x20 min   Knee/Hip Exercises: Seated   Long Arc Quad Strengthening;Both;3 sets;10 reps;Weights   Long Arc Quad Weight 4 lbs.   Hand Exercises   Opposition Strengthening;Right;Seated;Other (comment)  x3 min with towel   Thumb Opposition --   Other Hand Exercises R red web grip x4 min, R ball squeeze x3 min, towel pinch/grip x3 min each                  PT Short Term Goals - 02/01/15 1121    PT SHORT TERM GOAL #1   Title Ind with initial HEP.   Time 2   Period Weeks   Status Achieved           PT Long Term Goals - 03/06/15 1057    PT LONG TERM GOAL #1   Title Ind with advanced HEP.   Time 8   Period Weeks   Status Achieved   PT LONG TERM GOAL #2  Title Right grip increased to 35-40#.   Time 8   Period Weeks   Status Not Met   PT LONG TERM GOAL #3   Title Patient perform 20 minutes of aerobic activiity with only one rest.   Time 8   Period Weeks   Status Achieved  10mn with no rest required   PT LONG TERM GOAL #4   Title restore normal right UE proprioception.   Time 8   Period Weeks   Status Achieved   PT LONG TERM GOAL #5   Title Patient walk in clinic 500 feet without ataxia and supervision only.   Time 8   Period Weeks   Status Achieved               Plan - 03/06/15 1100    Clinical Impression Statement Patient tolerated treatment well and achieved all but one goal set at evaluation. The only goal not achieved was the grip strength of 35-40#, grip strength was tested today but patient could not move the needle of the hand dynamometer. Continues to have difficulty with finder dexterity for exercises. Denied pain following treatment.   Pt will benefit from skilled therapeutic intervention in order to improve on the following  deficits Decreased activity tolerance;Abnormal gait;Decreased coordination;Decreased endurance;Decreased balance;Decreased strength   Rehab Potential Good   PT Frequency 3x / week   PT Duration 8 weeks   PT Treatment/Interventions ADLs/Self Care Home Management;Patient/family education;Therapeutic exercise;Balance training;Neuromuscular re-education   PT Next Visit Plan Communicate with MPT regarding need of discharge summary and clinical judgement.   Consulted and Agree with Plan of Care Patient        Problem List Patient Active Problem List   Diagnosis Date Noted  . Cerebral infarction due to embolism of left middle cerebral artery 12/15/2014  . Paroxysmal atrial fibrillation 12/15/2014  . CKD (chronic kidney disease) 12/15/2014  . Hyperlipidemia 12/15/2014  . Type 2 diabetes mellitus with other circulatory complications 090/21/1155 . B12 deficiency anemia 12/10/2014  . Right hemiparesis 10/26/2014  . Diabetes mellitus 10/26/2014  . Essential hypertension   . HLD (hyperlipidemia)   . Stroke 10/25/2014  . CEREBROVASCULAR DISEASE 12/31/2009  . MURMUR 12/12/2008  . MYOCARDIAL INFARCTION 12/11/2008  . ANGINA, UNSTABLE 12/11/2008  . Coronary atherosclerosis 12/11/2008  . SUPRAVENTRICULAR TACHYCARDIA 12/11/2008  . Cerebral artery occlusion with cerebral infarction 12/11/2008  . Disorder resulting from impaired renal function 12/11/2008  . CAROTID BRUIT 12/11/2008  . HYPERGLYCEMIA 12/11/2008    KAhmed Prima PTA 03/06/2015 11:19 AM  [facility] [address] [address]: [phone]  Fax:  [phone]

## 2015-03-19 ENCOUNTER — Encounter: Payer: Self-pay | Admitting: Neurology

## 2015-03-19 ENCOUNTER — Ambulatory Visit (INDEPENDENT_AMBULATORY_CARE_PROVIDER_SITE_OTHER): Payer: Medicare Other | Admitting: Neurology

## 2015-03-19 VITALS — BP 142/68 | HR 68 | Ht 70.0 in | Wt 186.8 lb

## 2015-03-19 DIAGNOSIS — I480 Paroxysmal atrial fibrillation: Secondary | ICD-10-CM

## 2015-03-19 DIAGNOSIS — N184 Chronic kidney disease, stage 4 (severe): Secondary | ICD-10-CM

## 2015-03-19 DIAGNOSIS — E785 Hyperlipidemia, unspecified: Secondary | ICD-10-CM | POA: Diagnosis not present

## 2015-03-19 DIAGNOSIS — I63412 Cerebral infarction due to embolism of left middle cerebral artery: Secondary | ICD-10-CM

## 2015-03-19 DIAGNOSIS — I10 Essential (primary) hypertension: Secondary | ICD-10-CM | POA: Diagnosis not present

## 2015-03-19 DIAGNOSIS — E1159 Type 2 diabetes mellitus with other circulatory complications: Secondary | ICD-10-CM

## 2015-03-19 NOTE — Patient Instructions (Addendum)
-   continue Xarelto 15mg  and pravastatin for stroke prevention - continue ASA 81 for cardiac prevention - monitoring kidney function with Dr, Tollie Pizza at next visit, if getting worse, we may have to consider switch Xarelto to coumadin. - Continue to follow-up with Dr. Stanford Breed in cardiology for carotid stenosis.  - aggressive home exercise - Follow up with primary care physician for stroke risk factor modification. Recommend maintain blood pressure goal <130/80, diabetes with hemoglobin A1c goal below 6.5% and lipids with LDL cholesterol goal below 70 mg/dL.  - check BP and glucose at home.  - follow up in 3 months.

## 2015-03-21 NOTE — Progress Notes (Signed)
STROKE NEUROLOGY FOLLOW UP NOTE  NAME: [name] [name] DOB: [birth year]  REASON FOR VISIT: stroke follow up HISTORY FROM: chart and wife  Today we had the pleasure of seeing [name] [name] in follow-up at our Neurology Clinic. Pt was accompanied by wife.   History Summary [name] [name] is a 74 y.o. 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 MRI showed acute left MCA territory cortical and subcortical infarcts, again consistent with embolic stroke. CTA head and neck done showed 40% stenosis on that left ICA, lower stream he DVT negative, TEE was done showed aortic atheroma and no PFO. A1c 7.7, LDL 65. Loop recorder placed, and patient put on Plavix, continued on statin, and discharged to CIR.  12/15/14 follow up - the patient has been doing better. Not long after Loop recorder placement, he was found to have A. fib episode, and he was started on Xarelto for stroke prevention. He was also continued on aspirin 81 mg for cardiac prevention. He was later discharged from ER to home with home health, and currently he has finished off rehabilitation therapy. His BP 147/78, and he said he haven't taken BP meds today yet. He stated that his glucose is better controlled, running around 100.  Interval History During the interval time, pt has been doing well. However, wife just criticize him not doing aggressive home exercise. He still has unsteady walk but he is not using any cane or walker. His Cre last check was 2.99 in 10/2014. Continues on Xarelto and ASA. No side effects. His Bp 142/68 today in clinic. Pt stated that his sugar at home is in good control.   REVIEW OF SYSTEMS: Full 14 system review of systems performed and notable only for those  listed below and in HPI above, all others are negative:  Constitutional:   Cardiovascular:  Ear/Nose/Throat:   Skin:  Eyes:   Respiratory:   Gastroitestinal:   Genitourinary:  Hematology/Lymphatic:   Endocrine:  Musculoskeletal:   Allergy/Immunology:   Neurological:   Psychiatric:  Sleep:   The following represents the patient's updated allergies and side effects list: No Known Allergies  The neurologically relevant items on the patient's problem list were reviewed on today's visit.  Neurologic Examination  A problem focused neurological exam (12 or more points of the single system neurologic examination, vital signs counts as 1 point, cranial nerves count for 8 points) was performed.  Blood pressure 142/68, pulse 68, height 5\' 10"  (1.778 m), weight 186 lb 12.8 oz (84.732 kg).  General - Well nourished, well developed, in no apparent distress.  Ophthalmologic - Sharp disc margins OU.  Cardiovascular - Regular rate and rhythm.  Mental Status -  Level of arousal and orientation to time, place, and person were intact. Language including expression, naming, repetition, comprehension was assessed and found intact.  Cranial Nerves II - XII - II - Visual field intact OU. III, IV, VI - Extraocular movements intact. V - Facial sensation intact bilaterally. VII - Facial movement intact bilaterally. VIII - Hearing & vestibular intact bilaterally. X - Palate elevates symmetrically. XI - Chin turning & shoulder shrug intact bilaterally. XII - Tongue protrusion intact.  Motor Strength - The patient's strength was 5-/5 deltoid, 4/5 bicep and  tricep, 3/5 distally on the RUE, mild right shoulder tenderness on palpation but otherwise 5/5 in all extremities and pronator drift was present on the left.  Bulk was normal and fasciculations were absent.   Motor Tone - Muscle tone was assessed at the neck and appendages and was normal.  Reflexes - The patient's reflexes were normal in all  extremities and he had no pathological reflexes.  Sensory - Light touch, temperature/pinprick were assessed and were normal.    Coordination - The patient had normal movements in the hands and feet with no ataxia or dysmetria.  Tremor was absent.  Gait and Station - unsteady with decreased right arm swing, no fall.  Data reviewed: I personally reviewed the images and agree with the radiology interpretations.  Ct Angio Head W/cm &/or Wo Cm 10/25/2014  Mild to moderate motion degraded examination. No acute intracranial process. LEFT frontoparietal encephalomalacia suggest remote LEFT middle cerebral artery territory infarct. Moderate to severe parenchymal brain volume loss. Moderate to severe white matter changes suggest chronic small vessel ischemic disease. Chronic appearing RIGHT basal ganglia and RIGHT thalamus lacunar infarcts.  Atherosclerosis of the carotid bulbs, with up to 40% stenosis of LEFT internal carotid artery by NASCET criteria. Approximately 50% stenosis of vertebral artery origin. Acute maxillary sinusitis.   CTA HEAD:  Complete circle of Willis without large vessel occlusion or hemodynamically significant stenosis. Dolichoectatic intracranial vessels suggests sequelae of chronic hypertension.   MRI of the brain without contrast 10/26/2014 Acute LEFT MCA territory posterior frontal cortical and subcortical white matter infarction.  No hemorrhage or mass effect. Progression of generalized atrophy and small vessel disease with sequelae of multiple prior infarcts which were acute in 2008. No large vessel occlusion is evident. Acute and chronic maxillary sinus disease.  Renal ultrasound 10/26/2014 Negative renal ultrasound.  Bilateral lower extremity venous duplex Study was technically difficult due to poor patient cooperation and posterior acoustic shadowing from lower extremity arteries. Visualized veins of bilateral lower extremities which were able to be  compressed are negative for deep vein thrombosis. Unable to perform compression maneuvers on some segments due to poor patient cooperation and guarding. There is no evidence of Baker's cyst bilaterally.  CUS Findings suggest 1-39% right internal carotid artery stenosis and upper range 1-39% versus low range 40-59% stenosis of the left internal carotid artery. Vertebral arteries are patent with antegrade flow.  2D echo Limited, poor quality study. Probably normal overall LV function. Study is not adequate for wall motion analysis. No overt valvular abnormalities. Normal right ventricular size and function. No pericardial effusion. Impaired relaxation pattern of left ventricular filling. Normal mean left atrial pressure.  TEE  10/27/2014 Left Ventrical: Normal size and function, no WMA. Mitral Valve: Mild MR. Aortic Valve: Trace AI. Tricuspid Valve: Normal. Pulmonic Valve: Norma;. Left Atrium/ Left atrial appendage: No thrombus, normal emptying velicities. Atrial septum: No PFO.  Aorta: Severe non-mobile plague.  Assessment: As you may recall, he is a 74 y.o. 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 CTA head and neck done showed 40% stenosis on the left ICA, TEE was done showed aortic atheroma and no PFO. A1c 7.7, LDL 65. Loop recorder placed, and patient put  on Plavix, continued on statin, and discharged to CIR. Loop recorder detected A. fib episode, patient was put on Xarelto and baby aspirin. According to his creatinine clearance at about 25, he was put on Xarelto 15 mg daily. Right sided weakness much improved. Patient also follows with Dr. Stanford Breed in cardiology for  carotid stenosis and afib.  Plan:  - continue Xarelto 15mg  and pravastatin for stroke prevention - continue ASA 81 for cardiac prevention - monitoring kidney function with PCP at next visit, if getting worse, we may have to consider switch Xarelto to coumadin. - Continue to follow-up with Dr. Stanford Breed in cardiology for carotid stenosis and afib.  - aggressive home exercise - Follow up with primary care physician for stroke risk factor modification. Recommend maintain blood pressure goal <130/80, diabetes with hemoglobin A1c goal below 6.5% and lipids with LDL cholesterol goal below 70 mg/dL.  - check BP and glucose at home.  - RTC in 3 months.  I spent more than 25 minutes of face to face time with the patient. Greater than 50% of time was spent in counseling and coordination of care. We have discussed about aggressive home exercise and follow up with renal functions.   No orders of the defined types were placed in this encounter.    No orders of the defined types were placed in this encounter.    Patient Instructions  - continue Xarelto 15mg  and pravastatin for stroke prevention - continue ASA 81 for cardiac prevention - monitoring kidney function with Dr, Tollie Pizza at next visit, if getting worse, we may have to consider switch Xarelto to coumadin. - Continue to follow-up with Dr. Stanford Breed in cardiology for carotid stenosis.  - aggressive home exercise - Follow up with primary care physician for stroke risk factor modification. Recommend maintain blood pressure goal <130/80, diabetes with hemoglobin A1c goal below 6.5% and lipids with LDL cholesterol goal below 70 mg/dL.  - check BP and glucose at home.  - follow up in 3 months.    Rosalin Hawking, MD PhD [facility] [address] [place], [place] [address] [phone]

## 2015-03-26 ENCOUNTER — Ambulatory Visit (INDEPENDENT_AMBULATORY_CARE_PROVIDER_SITE_OTHER): Payer: Medicare Other | Admitting: *Deleted

## 2015-03-26 DIAGNOSIS — I639 Cerebral infarction, unspecified: Secondary | ICD-10-CM

## 2015-03-26 DIAGNOSIS — I6350 Cerebral infarction due to unspecified occlusion or stenosis of unspecified cerebral artery: Secondary | ICD-10-CM

## 2015-03-28 NOTE — Progress Notes (Signed)
Loop recorder 

## 2015-04-05 LAB — CUP PACEART REMOTE DEVICE CHECK: MDC IDC SESS DTM: 20160811115244

## 2015-04-17 ENCOUNTER — Encounter: Payer: Self-pay | Admitting: *Deleted

## 2015-04-17 NOTE — Progress Notes (Signed)
Pause episode from 04/13/15 at 09:41 printed for review.  Episode inappropriate, EGM suggests undersensing.  AF episode from 8/4 printed for review.  Episode appropriate, +Xarelto.  Monthly summary reports and ROV overdue with Dr. Stanford Breed.  Message sent to scheduler regarding patient's need for an appointment.

## 2015-04-18 ENCOUNTER — Telehealth: Payer: Self-pay | Admitting: Cardiology

## 2015-04-18 NOTE — Telephone Encounter (Signed)
Closed encounter °

## 2015-04-24 ENCOUNTER — Ambulatory Visit: Payer: Medicare Other | Admitting: Cardiology

## 2015-05-04 NOTE — Progress Notes (Signed)
HPI: 74 yo male for evaluation of atrial fibrillation and CAD. Last cardiac catheterization in April of 2009 showed a 40% mid LAD, 70% ostial OM and a 90% stenosis in the mid right coronary artery followed by a 70% stenosis in the distal right coronary artery. Patient had drug-eluting stents to both lesions in the right coronary artery successfully. Carotid Dopplers 7/14 showed 40-59% bilateral stenosis. Followup was recommended in one year. Had CVA 3/16. Echo 3/16 showed normal LV function and no valvular abnormalities. TEE 3/16 showed normal LV function, mild AI, aortic plaque, mild MR. Loop monitor showed PAF. Since last seen he denies dyspnea, chest pain, palpitations or syncope. No bleeding.  Current Outpatient Prescriptions  Medication Sig Dispense Refill  . amLODipine (NORVASC) 5 MG tablet Take 5 mg by mouth daily.  0  . aspirin EC 81 MG tablet Take 1 tablet (81 mg total) by mouth daily.    . fenofibrate 160 MG tablet Take 160 mg by mouth daily.  0  . furosemide (LASIX) 20 MG tablet Take 20 mg by mouth.    Marland Kitchen glipiZIDE (GLUCOTROL) 5 MG tablet Take 5 mg by mouth 2 (two) times daily before a meal.     . insulin glargine (LANTUS) 100 UNIT/ML injection Inject 0.3 mLs (30 Units total) into the skin at bedtime. 10 mL 11  . metoprolol (TOPROL-XL) 50 MG 24 hr tablet Take 50 mg by mouth daily.      . pravastatin (PRAVACHOL) 80 MG tablet Take 80 mg by mouth daily.    . Rivaroxaban (XARELTO) 15 MG TABS tablet Take 1 tablet (15 mg total) by mouth daily with supper. 30 tablet 11  . vitamin B-12 (CYANOCOBALAMIN) 100 MCG tablet Take 100 mcg by mouth daily.     No current facility-administered medications for this visit.    No Known Allergies   Past Medical History  Diagnosis Date  . SUPRAVENTRICULAR TACHYCARDIA   . MYOCARDIAL INFARCTION     x 2  . HYPERTENSION   . HYPERLIPIDEMIA   . CEREBROVASCULAR DISEASE   . CEREBROVASCULAR ACCIDENT   . CAD   . RENAL INSUFFICIENCY   . DIABETES  MELLITUS   . Cancer     skin    Past Surgical History  Procedure Laterality Date  . Coronary stent placement  2002, 2003    x 4  . Loop recorder implant N/A 10/27/2014    Procedure: LOOP RECORDER IMPLANT;  Surgeon: Deboraha Sprang, MD;  Location: [facility] CATH LAB;  Service: Cardiovascular;  Laterality: N/A;  . Tee without cardioversion N/A 10/27/2014    Procedure: TRANSESOPHAGEAL ECHOCARDIOGRAM (TEE);  Surgeon: Dorothy Spark, MD;  Location: [facility] ENDOSCOPY;  Service: Cardiovascular;  Laterality: N/A;    Social History   Social History  . Marital Status: Married    Spouse Name: Gene  . Number of Children: 0  . Years of Education: Elem   Occupational History  . Retired Other   Social History Main Topics  . Smoking status: Former Research scientist (life sciences)  . Smokeless tobacco: Never Used  . Alcohol Use: No  . Drug Use: No  . Sexual Activity: Not on file   Other Topics Concern  . Not on file   Social History Narrative   Patient lives at home with spouse.   Caffiene: 8 to 12oz daily    Family History  Problem Relation Age of Onset  . Stroke Father     ROS: no fevers or chills, productive cough, hemoptysis, dysphasia,  odynophagia, melena, hematochezia, dysuria, hematuria, rash, seizure activity, orthopnea, PND, pedal edema, claudication. Remaining systems are negative.  Physical Exam:   Blood pressure 102/50, pulse 80, height 6\' 1"  (1.854 m), weight 83.734 kg (184 lb 9.6 oz).  General:  Well developed/well nourished in NAD Skin warm/dry Patient not depressed No peripheral clubbing Back-normal HEENT-normal/normal eyelids Neck supple/normal carotid upstroke bilaterally; no bruits; no JVD; no thyromegaly chest - CTA/ normal expansion CV - RRR/normal S1 and S2; no murmurs, rubs or gallops;  PMI nondisplaced Abdomen -NT/ND, no HSM, no mass, + bowel sounds, no bruit 2+ femoral pulses, no bruits Ext-no edema, chords, 2+ DP Neuro-residual weakness right upper extremity from previous CVA.  ECG  sinus rhythm at a rate of 80. Normal axis.

## 2015-05-07 ENCOUNTER — Ambulatory Visit (INDEPENDENT_AMBULATORY_CARE_PROVIDER_SITE_OTHER): Payer: Medicare Other | Admitting: Cardiology

## 2015-05-07 ENCOUNTER — Encounter: Payer: Self-pay | Admitting: *Deleted

## 2015-05-07 ENCOUNTER — Encounter: Payer: Self-pay | Admitting: Cardiology

## 2015-05-07 VITALS — BP 102/50 | HR 80 | Ht 73.0 in | Wt 184.6 lb

## 2015-05-07 DIAGNOSIS — Z720 Tobacco use: Secondary | ICD-10-CM

## 2015-05-07 DIAGNOSIS — I679 Cerebrovascular disease, unspecified: Secondary | ICD-10-CM | POA: Diagnosis not present

## 2015-05-07 DIAGNOSIS — I480 Paroxysmal atrial fibrillation: Secondary | ICD-10-CM | POA: Diagnosis not present

## 2015-05-07 DIAGNOSIS — I2510 Atherosclerotic heart disease of native coronary artery without angina pectoris: Secondary | ICD-10-CM

## 2015-05-07 LAB — BASIC METABOLIC PANEL
BUN: 37 mg/dL — ABNORMAL HIGH (ref 7–25)
CALCIUM: 9.3 mg/dL (ref 8.6–10.3)
CO2: 28 mmol/L (ref 20–31)
Chloride: 103 mmol/L (ref 98–110)
Creat: 2.77 mg/dL — ABNORMAL HIGH (ref 0.70–1.18)
GLUCOSE: 206 mg/dL — AB (ref 65–99)
Potassium: 4.5 mmol/L (ref 3.5–5.3)
SODIUM: 142 mmol/L (ref 135–146)

## 2015-05-07 LAB — CBC
HEMATOCRIT: 38.3 % — AB (ref 39.0–52.0)
HEMOGLOBIN: 12.2 g/dL — AB (ref 13.0–17.0)
MCH: 27.9 pg (ref 26.0–34.0)
MCHC: 31.9 g/dL (ref 30.0–36.0)
MCV: 87.6 fL (ref 78.0–100.0)
MPV: 11.3 fL (ref 8.6–12.4)
Platelets: 265 10*3/uL (ref 150–400)
RBC: 4.37 MIL/uL (ref 4.22–5.81)
RDW: 14 % (ref 11.5–15.5)
WBC: 11.9 10*3/uL — AB (ref 4.0–10.5)

## 2015-05-07 NOTE — Assessment & Plan Note (Signed)
Schedule follow-up carotid Dopplers. 

## 2015-05-07 NOTE — Assessment & Plan Note (Signed)
Continue statin. 

## 2015-05-07 NOTE — Assessment & Plan Note (Signed)
Patient counseled on discontinuing. 

## 2015-05-07 NOTE — Patient Instructions (Addendum)
Your physician wants you to follow-up in: [place] will receive a reminder letter in the mail two months in advance. If you don't receive a letter, please call our office to schedule the follow-up appointment.   STOP ASPIRIN  Your physician recommends that you HAVE LAB [place]  Your physician has requested that you have a carotid duplex. This test is an ultrasound of the carotid arteries in your neck. It looks at blood flow through these arteries that supply the brain with blood. Allow one hour for this exam. There are no restrictions or special instructions.

## 2015-05-07 NOTE — Assessment & Plan Note (Signed)
Given need for anticoagulation I will discontinue aspirin. Continue statin.

## 2015-05-07 NOTE — Assessment & Plan Note (Signed)
Blood pressure controlled. Continue present medications. 

## 2015-05-07 NOTE — Assessment & Plan Note (Signed)
Continue beta blocker. 

## 2015-05-07 NOTE — Assessment & Plan Note (Signed)
Patient remains in sinus rhythm. Given history of CVA and documented paroxysmal atrial fibrillation he will need lifelong anticoagulation. He does have renal insufficiency. Check hemoglobin and renal function. He may not be a candidate for xarelto in the future if GFR decreases.

## 2015-05-08 ENCOUNTER — Other Ambulatory Visit: Payer: Self-pay | Admitting: Cardiology

## 2015-05-08 DIAGNOSIS — I6523 Occlusion and stenosis of bilateral carotid arteries: Secondary | ICD-10-CM

## 2015-05-08 DIAGNOSIS — I679 Cerebrovascular disease, unspecified: Secondary | ICD-10-CM

## 2015-05-10 ENCOUNTER — Ambulatory Visit (INDEPENDENT_AMBULATORY_CARE_PROVIDER_SITE_OTHER): Payer: Medicare Other | Admitting: Pharmacist Clinician (PhC)/ Clinical Pharmacy Specialist

## 2015-05-10 ENCOUNTER — Ambulatory Visit (HOSPITAL_COMMUNITY)
Admission: RE | Admit: 2015-05-10 | Discharge: 2015-05-10 | Disposition: A | Discharge status: Home / Self Care | Payer: Medicare Other | Source: Ambulatory Visit | Attending: Cardiology | Admitting: Cardiology

## 2015-05-10 DIAGNOSIS — I10 Essential (primary) hypertension: Secondary | ICD-10-CM | POA: Diagnosis not present

## 2015-05-10 DIAGNOSIS — F17200 Nicotine dependence, unspecified, uncomplicated: Secondary | ICD-10-CM | POA: Diagnosis not present

## 2015-05-10 DIAGNOSIS — I6523 Occlusion and stenosis of bilateral carotid arteries: Secondary | ICD-10-CM | POA: Insufficient documentation

## 2015-05-10 DIAGNOSIS — I63412 Cerebral infarction due to embolism of left middle cerebral artery: Secondary | ICD-10-CM | POA: Diagnosis not present

## 2015-05-10 DIAGNOSIS — Z7901 Long term (current) use of anticoagulants: Secondary | ICD-10-CM

## 2015-05-10 DIAGNOSIS — I2510 Atherosclerotic heart disease of native coronary artery without angina pectoris: Secondary | ICD-10-CM | POA: Diagnosis not present

## 2015-05-10 DIAGNOSIS — E119 Type 2 diabetes mellitus without complications: Secondary | ICD-10-CM | POA: Insufficient documentation

## 2015-05-10 DIAGNOSIS — E785 Hyperlipidemia, unspecified: Secondary | ICD-10-CM | POA: Diagnosis not present

## 2015-05-10 DIAGNOSIS — I679 Cerebrovascular disease, unspecified: Secondary | ICD-10-CM | POA: Insufficient documentation

## 2015-05-10 MED ORDER — WARFARIN SODIUM 5 MG PO TABS: 5.0000 mg | ORAL_TABLET | Freq: Every day | ORAL | Status: DC

## 2015-05-10 NOTE — Progress Notes (Signed)
Explained safety issues, side effects, dosing and food concerns of warfarin today.  Patient will take 5 mg daliy, with overlap of Xarelto for 3 days, and return on Wednesday for first INR check.

## 2015-05-15 ENCOUNTER — Encounter (HOSPITAL_COMMUNITY): Payer: Medicare Other

## 2015-05-16 ENCOUNTER — Ambulatory Visit (INDEPENDENT_AMBULATORY_CARE_PROVIDER_SITE_OTHER): Payer: Medicare Other | Admitting: Pharmacist Clinician (PhC)/ Clinical Pharmacy Specialist

## 2015-05-16 DIAGNOSIS — Z7901 Long term (current) use of anticoagulants: Secondary | ICD-10-CM

## 2015-05-16 DIAGNOSIS — I63412 Cerebral infarction due to embolism of left middle cerebral artery: Secondary | ICD-10-CM

## 2015-05-16 LAB — POCT INR: INR: 2.2

## 2015-05-18 ENCOUNTER — Encounter: Payer: Self-pay | Admitting: Internal Medicine

## 2015-05-23 ENCOUNTER — Ambulatory Visit (INDEPENDENT_AMBULATORY_CARE_PROVIDER_SITE_OTHER): Payer: Medicare Other | Admitting: Pharmacist Clinician (PhC)/ Clinical Pharmacy Specialist

## 2015-05-23 DIAGNOSIS — I63412 Cerebral infarction due to embolism of left middle cerebral artery: Secondary | ICD-10-CM | POA: Diagnosis not present

## 2015-05-23 DIAGNOSIS — Z7901 Long term (current) use of anticoagulants: Secondary | ICD-10-CM

## 2015-05-23 LAB — POCT INR: INR: 4.1

## 2015-05-25 ENCOUNTER — Ambulatory Visit (INDEPENDENT_AMBULATORY_CARE_PROVIDER_SITE_OTHER): Payer: Medicare Other | Admitting: *Deleted

## 2015-05-25 ENCOUNTER — Encounter: Payer: Self-pay | Admitting: Internal Medicine

## 2015-05-25 DIAGNOSIS — I63412 Cerebral infarction due to embolism of left middle cerebral artery: Secondary | ICD-10-CM

## 2015-05-29 NOTE — Progress Notes (Signed)
Loop recorder 

## 2015-05-30 ENCOUNTER — Ambulatory Visit (INDEPENDENT_AMBULATORY_CARE_PROVIDER_SITE_OTHER): Payer: Medicare Other | Admitting: Pharmacist Clinician (PhC)/ Clinical Pharmacy Specialist

## 2015-05-30 DIAGNOSIS — I63412 Cerebral infarction due to embolism of left middle cerebral artery: Secondary | ICD-10-CM | POA: Diagnosis not present

## 2015-05-30 DIAGNOSIS — Z7901 Long term (current) use of anticoagulants: Secondary | ICD-10-CM | POA: Diagnosis not present

## 2015-05-30 LAB — POCT INR: INR: 3.4

## 2015-06-05 ENCOUNTER — Encounter: Payer: Self-pay | Admitting: Internal Medicine

## 2015-06-06 ENCOUNTER — Ambulatory Visit (INDEPENDENT_AMBULATORY_CARE_PROVIDER_SITE_OTHER): Payer: Medicare Other | Admitting: Pharmacist

## 2015-06-06 DIAGNOSIS — I63412 Cerebral infarction due to embolism of left middle cerebral artery: Secondary | ICD-10-CM | POA: Diagnosis not present

## 2015-06-06 DIAGNOSIS — Z7901 Long term (current) use of anticoagulants: Secondary | ICD-10-CM | POA: Diagnosis not present

## 2015-06-06 LAB — POCT INR: INR: 2.4

## 2015-06-13 ENCOUNTER — Ambulatory Visit (INDEPENDENT_AMBULATORY_CARE_PROVIDER_SITE_OTHER): Payer: Medicare Other | Admitting: Pharmacist Clinician (PhC)/ Clinical Pharmacy Specialist

## 2015-06-13 DIAGNOSIS — I63412 Cerebral infarction due to embolism of left middle cerebral artery: Secondary | ICD-10-CM

## 2015-06-13 DIAGNOSIS — Z7901 Long term (current) use of anticoagulants: Secondary | ICD-10-CM | POA: Diagnosis not present

## 2015-06-13 LAB — POCT INR: INR: 2.6

## 2015-06-18 LAB — CUP PACEART REMOTE DEVICE CHECK: MDC IDC SESS DTM: 20160930163532

## 2015-06-18 NOTE — Progress Notes (Signed)
Carelink summary report received. Battery status OK. Normal device function. No new symptom episodes, tachy episodes, brady, or pause episodes. 16 AF episodes (burden 0.1%), +warfarin, avg V rate relatively controlled. Monthly summary reports and ROV with SK PRN.

## 2015-06-25 ENCOUNTER — Ambulatory Visit (INDEPENDENT_AMBULATORY_CARE_PROVIDER_SITE_OTHER): Payer: Medicare Other | Admitting: *Deleted

## 2015-06-25 DIAGNOSIS — I63412 Cerebral infarction due to embolism of left middle cerebral artery: Secondary | ICD-10-CM | POA: Diagnosis not present

## 2015-06-26 NOTE — Progress Notes (Signed)
Loop recorder 

## 2015-06-27 ENCOUNTER — Ambulatory Visit (INDEPENDENT_AMBULATORY_CARE_PROVIDER_SITE_OTHER): Payer: Medicare Other | Admitting: Pharmacist Clinician (PhC)/ Clinical Pharmacy Specialist

## 2015-06-27 DIAGNOSIS — I63412 Cerebral infarction due to embolism of left middle cerebral artery: Secondary | ICD-10-CM | POA: Diagnosis not present

## 2015-06-27 DIAGNOSIS — Z7901 Long term (current) use of anticoagulants: Secondary | ICD-10-CM

## 2015-06-27 LAB — POCT INR: INR: 3.7

## 2015-07-07 ENCOUNTER — Other Ambulatory Visit: Payer: Self-pay | Admitting: Cardiology

## 2015-07-13 ENCOUNTER — Ambulatory Visit (INDEPENDENT_AMBULATORY_CARE_PROVIDER_SITE_OTHER): Payer: Medicare Other | Admitting: Pharmacist Clinician (PhC)/ Clinical Pharmacy Specialist

## 2015-07-13 DIAGNOSIS — Z7901 Long term (current) use of anticoagulants: Secondary | ICD-10-CM

## 2015-07-13 DIAGNOSIS — I63412 Cerebral infarction due to embolism of left middle cerebral artery: Secondary | ICD-10-CM

## 2015-07-13 LAB — POCT INR: INR: 2.7

## 2015-07-20 LAB — CUP PACEART REMOTE DEVICE CHECK: MDC IDC SESS DTM: 20161030163713

## 2015-07-20 NOTE — Progress Notes (Signed)
Carelink summary report received. Battery status OK. Normal device function. No new symptom, tachy, or brady episodes. 2 pause episodes--1 appropriate, duration ~3sec, 1 inappropriate, undersensed R-wave. 47 AF episodes (burden 0.8%), +warfarin and metoprolol, avg V rate controlled. Monthly summary reports and ROV with SK PRN.

## 2015-07-24 ENCOUNTER — Ambulatory Visit (INDEPENDENT_AMBULATORY_CARE_PROVIDER_SITE_OTHER): Payer: Medicare Other | Admitting: *Deleted

## 2015-07-24 DIAGNOSIS — I63412 Cerebral infarction due to embolism of left middle cerebral artery: Secondary | ICD-10-CM

## 2015-07-24 NOTE — Progress Notes (Signed)
Carelink Summary Report / Loop Recorder 

## 2015-08-03 ENCOUNTER — Ambulatory Visit (INDEPENDENT_AMBULATORY_CARE_PROVIDER_SITE_OTHER): Payer: Medicare Other | Admitting: Pharmacist Clinician (PhC)/ Clinical Pharmacy Specialist

## 2015-08-03 DIAGNOSIS — I63412 Cerebral infarction due to embolism of left middle cerebral artery: Secondary | ICD-10-CM

## 2015-08-03 DIAGNOSIS — Z7901 Long term (current) use of anticoagulants: Secondary | ICD-10-CM

## 2015-08-03 LAB — POCT INR: INR: 4.3

## 2015-08-15 ENCOUNTER — Ambulatory Visit (INDEPENDENT_AMBULATORY_CARE_PROVIDER_SITE_OTHER): Payer: Medicare Other | Admitting: Pharmacist Clinician (PhC)/ Clinical Pharmacy Specialist

## 2015-08-15 DIAGNOSIS — I63412 Cerebral infarction due to embolism of left middle cerebral artery: Secondary | ICD-10-CM

## 2015-08-15 DIAGNOSIS — Z7901 Long term (current) use of anticoagulants: Secondary | ICD-10-CM

## 2015-08-15 LAB — POCT INR: INR: 1.4

## 2015-08-23 ENCOUNTER — Other Ambulatory Visit: Payer: Self-pay | Admitting: Cardiology

## 2015-08-23 ENCOUNTER — Ambulatory Visit (INDEPENDENT_AMBULATORY_CARE_PROVIDER_SITE_OTHER): Payer: Medicare Other | Admitting: *Deleted

## 2015-08-23 ENCOUNTER — Ambulatory Visit (INDEPENDENT_AMBULATORY_CARE_PROVIDER_SITE_OTHER): Payer: Medicare Other | Admitting: Pharmacist Clinician (PhC)/ Clinical Pharmacy Specialist

## 2015-08-23 DIAGNOSIS — Z7901 Long term (current) use of anticoagulants: Secondary | ICD-10-CM

## 2015-08-23 DIAGNOSIS — I63412 Cerebral infarction due to embolism of left middle cerebral artery: Secondary | ICD-10-CM | POA: Diagnosis not present

## 2015-08-23 LAB — POCT INR: INR: 3.1

## 2015-08-23 NOTE — Progress Notes (Signed)
Carelink Summary Report / Loop Recorder 

## 2015-08-24 NOTE — Telephone Encounter (Signed)
°*  STAT* If patient is at the pharmacy, call can be transferred to refill team.   1. Which medications need to be refilled? (please list name of each medication and dose if known) Warfarin-please call today,he is out  2. Which pharmacy/location (including street and city if local pharmacy) is medication to be sent to?[phone] 3. Do they need a 30 day or 90 day supply? 30 and refills

## 2015-09-05 ENCOUNTER — Encounter: Payer: Medicare Other | Admitting: Pharmacist Clinician (PhC)/ Clinical Pharmacy Specialist

## 2015-09-07 ENCOUNTER — Ambulatory Visit (INDEPENDENT_AMBULATORY_CARE_PROVIDER_SITE_OTHER): Payer: Medicare Other | Admitting: Pharmacist Clinician (PhC)/ Clinical Pharmacy Specialist

## 2015-09-07 DIAGNOSIS — Z7901 Long term (current) use of anticoagulants: Secondary | ICD-10-CM | POA: Diagnosis not present

## 2015-09-07 DIAGNOSIS — I63412 Cerebral infarction due to embolism of left middle cerebral artery: Secondary | ICD-10-CM | POA: Diagnosis not present

## 2015-09-07 LAB — POCT INR: INR: 2.4

## 2015-09-24 ENCOUNTER — Ambulatory Visit (INDEPENDENT_AMBULATORY_CARE_PROVIDER_SITE_OTHER): Payer: Medicare Other | Admitting: *Deleted

## 2015-09-24 DIAGNOSIS — I63412 Cerebral infarction due to embolism of left middle cerebral artery: Secondary | ICD-10-CM

## 2015-09-24 NOTE — Progress Notes (Signed)
Carelink Summary Report / Loop Recorder 

## 2015-09-25 LAB — CUP PACEART REMOTE DEVICE CHECK: MDC IDC SESS DTM: 20161129170838

## 2015-09-28 ENCOUNTER — Ambulatory Visit (INDEPENDENT_AMBULATORY_CARE_PROVIDER_SITE_OTHER): Payer: Medicare Other | Admitting: Pharmacist Clinician (PhC)/ Clinical Pharmacy Specialist

## 2015-09-28 DIAGNOSIS — Z7901 Long term (current) use of anticoagulants: Secondary | ICD-10-CM

## 2015-09-28 DIAGNOSIS — I63412 Cerebral infarction due to embolism of left middle cerebral artery: Secondary | ICD-10-CM | POA: Diagnosis not present

## 2015-09-28 LAB — POCT INR: INR: 3.7

## 2015-09-29 LAB — CUP PACEART REMOTE DEVICE CHECK: Date Time Interrogation Session: 20161229170819

## 2015-10-02 ENCOUNTER — Telehealth: Payer: Self-pay | Admitting: Cardiology

## 2015-10-02 MED ORDER — WARFARIN SODIUM 5 MG PO TABS: ORAL_TABLET | ORAL | Status: DC

## 2015-10-02 NOTE — Telephone Encounter (Signed)
Patient requesting coumadin refill. Can we fill this or do you prefer to do this?  Routed to [facility]

## 2015-10-02 NOTE — Telephone Encounter (Signed)
New message      *STAT* If patient is at the pharmacy, call can be transferred to refill team.   1. Which medications need to be refilled? (please list name of each medication and dose if known) warfarin  5 mg   2. Which pharmacy/location (including street and city if local pharmacy) is medication to be sent to? [facility] on [address] in [place]  3. Do they need a 30 day or 90 day supply? 30 days supply

## 2015-10-12 ENCOUNTER — Encounter: Payer: Self-pay | Admitting: Cardiology

## 2015-10-12 ENCOUNTER — Ambulatory Visit (INDEPENDENT_AMBULATORY_CARE_PROVIDER_SITE_OTHER): Payer: Medicare Other | Admitting: Pharmacist Clinician (PhC)/ Clinical Pharmacy Specialist

## 2015-10-12 DIAGNOSIS — I63412 Cerebral infarction due to embolism of left middle cerebral artery: Secondary | ICD-10-CM

## 2015-10-12 DIAGNOSIS — Z7901 Long term (current) use of anticoagulants: Secondary | ICD-10-CM | POA: Diagnosis not present

## 2015-10-12 LAB — POCT INR: INR: 4.1

## 2015-10-19 ENCOUNTER — Encounter: Payer: Self-pay | Admitting: Cardiology

## 2015-10-22 ENCOUNTER — Ambulatory Visit (INDEPENDENT_AMBULATORY_CARE_PROVIDER_SITE_OTHER): Payer: Medicare Other | Admitting: *Deleted

## 2015-10-22 DIAGNOSIS — I63412 Cerebral infarction due to embolism of left middle cerebral artery: Secondary | ICD-10-CM

## 2015-10-23 NOTE — Progress Notes (Signed)
Carelink Summary Report / Loop Recorder 

## 2015-10-26 ENCOUNTER — Encounter: Payer: Self-pay | Admitting: Cardiology

## 2015-10-29 ENCOUNTER — Ambulatory Visit (INDEPENDENT_AMBULATORY_CARE_PROVIDER_SITE_OTHER): Payer: Medicare Other | Admitting: Pharmacist Clinician (PhC)/ Clinical Pharmacy Specialist

## 2015-10-29 DIAGNOSIS — Z7901 Long term (current) use of anticoagulants: Secondary | ICD-10-CM

## 2015-10-29 DIAGNOSIS — I63412 Cerebral infarction due to embolism of left middle cerebral artery: Secondary | ICD-10-CM

## 2015-10-29 LAB — POCT INR: INR: 3

## 2015-11-11 LAB — CUP PACEART REMOTE DEVICE CHECK: Date Time Interrogation Session: 20170128173810

## 2015-11-11 NOTE — Progress Notes (Signed)
Carelink summary report received. Battery status OK. Normal device function. No new symptom episodes, brady, or pause episodes. 1 pause episode-previously addressed. 1 tachy episode- appears to be atrial in origin, lasting 16 seconds. 29 AF 0.2% burden +Warfarin. Monthly summary reports and ROV/PRN

## 2015-11-19 ENCOUNTER — Ambulatory Visit (INDEPENDENT_AMBULATORY_CARE_PROVIDER_SITE_OTHER): Payer: Medicare Other | Admitting: Pharmacist Clinician (PhC)/ Clinical Pharmacy Specialist

## 2015-11-19 DIAGNOSIS — Z7901 Long term (current) use of anticoagulants: Secondary | ICD-10-CM | POA: Diagnosis not present

## 2015-11-19 DIAGNOSIS — I63412 Cerebral infarction due to embolism of left middle cerebral artery: Secondary | ICD-10-CM | POA: Diagnosis not present

## 2015-11-19 LAB — POCT INR: INR: 2.3

## 2015-11-21 ENCOUNTER — Ambulatory Visit (INDEPENDENT_AMBULATORY_CARE_PROVIDER_SITE_OTHER): Payer: Medicare Other | Admitting: *Deleted

## 2015-11-21 DIAGNOSIS — I63412 Cerebral infarction due to embolism of left middle cerebral artery: Secondary | ICD-10-CM

## 2015-11-21 NOTE — Progress Notes (Signed)
Carelink Summary Report / Loop Recorder 

## 2015-12-19 ENCOUNTER — Ambulatory Visit (INDEPENDENT_AMBULATORY_CARE_PROVIDER_SITE_OTHER): Payer: Medicare Other | Admitting: Pharmacist Clinician (PhC)/ Clinical Pharmacy Specialist

## 2015-12-19 DIAGNOSIS — I63412 Cerebral infarction due to embolism of left middle cerebral artery: Secondary | ICD-10-CM

## 2015-12-19 DIAGNOSIS — Z7901 Long term (current) use of anticoagulants: Secondary | ICD-10-CM | POA: Diagnosis not present

## 2015-12-19 LAB — POCT INR: INR: 4.2

## 2015-12-21 ENCOUNTER — Ambulatory Visit (INDEPENDENT_AMBULATORY_CARE_PROVIDER_SITE_OTHER): Payer: Medicare Other | Admitting: *Deleted

## 2015-12-21 DIAGNOSIS — I63412 Cerebral infarction due to embolism of left middle cerebral artery: Secondary | ICD-10-CM

## 2015-12-21 NOTE — Progress Notes (Signed)
Carelink Summary Report / Loop Recorder 

## 2016-01-02 ENCOUNTER — Ambulatory Visit (INDEPENDENT_AMBULATORY_CARE_PROVIDER_SITE_OTHER): Payer: Medicare Other | Admitting: Pharmacist

## 2016-01-02 DIAGNOSIS — I63412 Cerebral infarction due to embolism of left middle cerebral artery: Secondary | ICD-10-CM | POA: Diagnosis not present

## 2016-01-02 DIAGNOSIS — Z7901 Long term (current) use of anticoagulants: Secondary | ICD-10-CM

## 2016-01-02 LAB — POCT INR: INR: 2.9

## 2016-01-15 LAB — CUP PACEART REMOTE DEVICE CHECK: Date Time Interrogation Session: 20170227180854

## 2016-01-15 NOTE — Progress Notes (Signed)
Carelink summary report received. Battery status OK. Normal device function. No new symptom episodes, tachy episodes, brady, or pause episodes. 1 "AF" episode- 0% burden, see ECG ?PACs, +warfarin. Monthly summary reports and ROV/PRN

## 2016-01-19 LAB — CUP PACEART REMOTE DEVICE CHECK: Date Time Interrogation Session: 20170329183757

## 2016-01-19 NOTE — Progress Notes (Signed)
Carelink summary report received. Battery status OK. Normal device function. No new symptom episodes, tachy episodes, brady episodes. 3.9% AF, +Warfarin, pause episodes chronic. Monthly summary reports and ROV/PRN

## 2016-01-22 ENCOUNTER — Ambulatory Visit (INDEPENDENT_AMBULATORY_CARE_PROVIDER_SITE_OTHER): Payer: Medicare Other | Admitting: *Deleted

## 2016-01-22 DIAGNOSIS — I63412 Cerebral infarction due to embolism of left middle cerebral artery: Secondary | ICD-10-CM

## 2016-01-23 ENCOUNTER — Ambulatory Visit (INDEPENDENT_AMBULATORY_CARE_PROVIDER_SITE_OTHER): Payer: Medicare Other | Admitting: Pharmacist Clinician (PhC)/ Clinical Pharmacy Specialist

## 2016-01-23 DIAGNOSIS — I63412 Cerebral infarction due to embolism of left middle cerebral artery: Secondary | ICD-10-CM

## 2016-01-23 DIAGNOSIS — Z7901 Long term (current) use of anticoagulants: Secondary | ICD-10-CM

## 2016-01-23 LAB — POCT INR: INR: 2.5

## 2016-01-23 NOTE — Progress Notes (Signed)
Carelink Summary Report / Loop Recorder 

## 2016-02-01 LAB — CUP PACEART REMOTE DEVICE CHECK: MDC IDC SESS DTM: 20170428190952

## 2016-02-04 ENCOUNTER — Other Ambulatory Visit: Payer: Self-pay | Admitting: Cardiology

## 2016-02-19 ENCOUNTER — Ambulatory Visit (INDEPENDENT_AMBULATORY_CARE_PROVIDER_SITE_OTHER): Payer: Medicare Other | Admitting: *Deleted

## 2016-02-19 DIAGNOSIS — I63412 Cerebral infarction due to embolism of left middle cerebral artery: Secondary | ICD-10-CM | POA: Diagnosis not present

## 2016-02-20 ENCOUNTER — Ambulatory Visit (INDEPENDENT_AMBULATORY_CARE_PROVIDER_SITE_OTHER): Payer: Medicare Other | Admitting: Pharmacist Clinician (PhC)/ Clinical Pharmacy Specialist

## 2016-02-20 DIAGNOSIS — I63412 Cerebral infarction due to embolism of left middle cerebral artery: Secondary | ICD-10-CM | POA: Diagnosis not present

## 2016-02-20 DIAGNOSIS — Z7901 Long term (current) use of anticoagulants: Secondary | ICD-10-CM

## 2016-02-20 LAB — POCT INR: INR: 3

## 2016-02-20 NOTE — Progress Notes (Signed)
Carelink Summary Report / Loop Recorder 

## 2016-02-25 IMAGING — CT CT ANGIO HEAD
1 of 11 series · 1 of 33 positions shown · IV contrast (Iohexol (Omnipaque 350))
Comparison: MRI of the brain November 19, 2006

CLINICAL DATA: RIGHT arm weakness, slurred speech, last seen normal
at 3325 hours. History of hypertension, hyperlipidemia, diabetes,
stroke.

EXAM:
CT ANGIOGRAPHY HEAD AND NECK
TECHNIQUE: Multidetector CT imaging of the head and neck was performed using
the standard protocol during bolus administration of intravenous
contrast. Multiplanar CT image reconstructions and MIPs were
obtained to evaluate the vascular anatomy. Carotid stenosis
measurements (when applicable) are obtained utilizing NASCET
criteria, using the distal internal carotid diameter as the
denominator.
CONTRAST:  60mL OMNIPAQUE IOHEXOL 350 MG/ML SOLN

[Series 300: locator · axial · 0.49mm/px · 1 of 1 slices shown]
[im 1/1  soft-tissue]
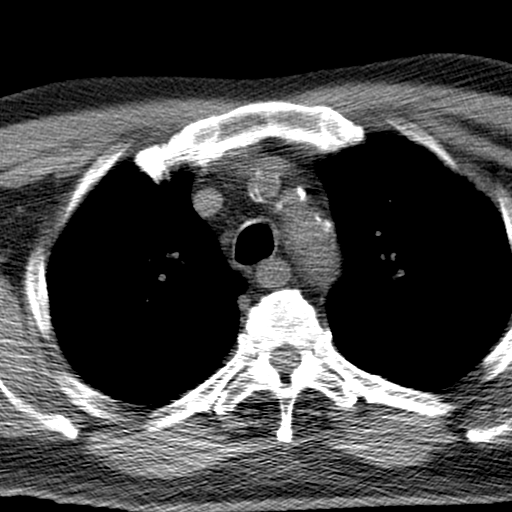

[1 of 33 positions shown; findings below may reference images not displayed]

FINDINGS: CT HEAD

Brain: Mild to moderately motion degraded examination, particularly
degrades evaluation skull base.

No intraparenchymal hemorrhage, mass effect, midline shift or acute
large vascular territory infarct. LEFT frontoparietal
encephalomalacia. Patchy to confluent supratentorial white matter
hypodensities. Moderate to severe ventriculomegaly, likely on the
basis of global parenchymal brain volume loss as there is overall
commensurate enlargement of cerebral sulci and cerebellar folia.
Remote RIGHT cystic basal ganglia and thalamus lacunar infarcts.

Basal cisterns are patent. Moderate calcific atherosclerosis of the
carotid siphons.

Calvarium and skull base: No skull fracture though, assessment
limited at the skull base. Patient is edentulous.

Paranasal sinuses: Probable maxillary sinusitis, limited by motion.
Limited assessment of mastoid air cells.

Orbits:   Status post bilateral ocular lens implants.

CTA NECK

Normal appearance of the thoracic arch, normal branch pattern. Mild
to moderate calcific atherosclerosis. The origins of the innominate,
left Common carotid artery and subclavian artery are widely patent.

Bilateral Common carotid arteries are widely patent, coursing in a
straight line fashion. Eccentric intimal thickening results in less
than 40% stenosis of LEFT internal carotid artery. Mild calcific
atherosclerosis of the RIGHT Common carotid artery. 2-3 mm eccentric
intimal thickening and calcific atherosclerosis of the carotid bulbs
extending to the internal carotid artery origins. Normal appearance
of the carotid bifurcations without hemodynamically significant
stenosis by NASCET criteria. Approximately 40% stenosis of LEFT
internal carotid artery origin. Normal appearance of the included
internal carotid arteries.

Left vertebral artery is dominant. Atherosclerosis of the origin
results in approximately 50% narrowing of RIGHT vertebral artery
origin. The vertebral arteries

No dissection, no pseudoaneurysm. No abnormal luminal irregularity.
No contrast extravasation.

9 mm LEFT thyroid nodule, below size surveillance recommendations.
Fatty parotid glands. Patient is edentulous. Bilateral maxillary
sinus mucosal thickening with air-fluid levels. No acute osseous
process though bone windows have not been submitted.

CTA HEAD

Anterior circulation: Normal appearance of the cervical internal
carotid arteries, petrous, cavernous and supra clinoid internal
carotid arteries. Widely patent anterior communicating artery.
Normal appearance of the anterior and middle cerebral arteries.
Dolichoectatic intracranial vessels.

Posterior circulation: The LEFT vertebral artery is dominant with
normal appearance of the vertebral arteries, vertebrobasilar
junction and basilar artery, as well as main branch vessels. RIGHT
posterior inferior cerebellar artery origin infundibulum. Large,
robust bilateral posterior communicating arteries contribute the
predominant vascular supply to the posterior cerebral arteries which
are widely patent. Dolichoectatic intracranial vessels.

No large vessel occlusion, hemodynamically significant stenosis,
dissection, luminal irregularity, contrast extravasation or aneurysm
within the anterior nor posterior circulation.
IMPRESSION: CT HEAD:  Mild to moderate motion degraded examination.

No acute intracranial process. LEFT frontoparietal encephalomalacia
suggest remote LEFT middle cerebral artery territory infarct.

Moderate to severe parenchymal brain volume loss. Moderate to severe
white matter changes suggest chronic small vessel ischemic disease.
Chronic appearing RIGHT basal ganglia and RIGHT thalamus lacunar
infarcts.

CTA NECK: Atherosclerosis of the carotid bulbs, with up to 40%
stenosis of LEFT internal carotid artery by NASCET criteria.

Approximately 50% stenosis of vertebral artery origin.

Acute maxillary sinusitis.

CTA HEAD: Complete circle of Willis without large vessel occlusion
or hemodynamically significant stenosis.

Dolichoectatic intracranial vessels suggests sequelae of chronic
hypertension.

Acute findings discussed with and reconfirmed by Dr.MANUELLA TIGER on

  By: Mangyu Flemings

## 2016-02-29 LAB — CUP PACEART REMOTE DEVICE CHECK: MDC IDC SESS DTM: 20170528193823

## 2016-03-07 LAB — CUP PACEART REMOTE DEVICE CHECK: Date Time Interrogation Session: 20170627200737

## 2016-03-14 ENCOUNTER — Other Ambulatory Visit: Payer: Self-pay | Admitting: Cardiology

## 2016-03-18 ENCOUNTER — Ambulatory Visit (INDEPENDENT_AMBULATORY_CARE_PROVIDER_SITE_OTHER): Payer: Medicare Other | Admitting: Pharmacist Clinician (PhC)/ Clinical Pharmacy Specialist

## 2016-03-18 DIAGNOSIS — Z7901 Long term (current) use of anticoagulants: Secondary | ICD-10-CM

## 2016-03-18 DIAGNOSIS — I63412 Cerebral infarction due to embolism of left middle cerebral artery: Secondary | ICD-10-CM

## 2016-03-18 LAB — POCT INR: INR: 4.7

## 2016-03-20 ENCOUNTER — Ambulatory Visit (INDEPENDENT_AMBULATORY_CARE_PROVIDER_SITE_OTHER): Payer: Medicare Other | Admitting: *Deleted

## 2016-03-20 DIAGNOSIS — I63412 Cerebral infarction due to embolism of left middle cerebral artery: Secondary | ICD-10-CM | POA: Diagnosis not present

## 2016-03-21 NOTE — Progress Notes (Signed)
Carelink Summary Report / Loop Recorder 

## 2016-04-03 ENCOUNTER — Ambulatory Visit (INDEPENDENT_AMBULATORY_CARE_PROVIDER_SITE_OTHER): Payer: Medicare Other | Admitting: Pharmacist

## 2016-04-03 DIAGNOSIS — I63412 Cerebral infarction due to embolism of left middle cerebral artery: Secondary | ICD-10-CM | POA: Diagnosis not present

## 2016-04-03 DIAGNOSIS — Z7901 Long term (current) use of anticoagulants: Secondary | ICD-10-CM | POA: Diagnosis not present

## 2016-04-03 LAB — POCT INR: INR: 2.9

## 2016-04-20 LAB — CUP PACEART REMOTE DEVICE CHECK: MDC IDC SESS DTM: 20170727210758

## 2016-04-21 ENCOUNTER — Ambulatory Visit (INDEPENDENT_AMBULATORY_CARE_PROVIDER_SITE_OTHER): Payer: Medicare Other | Admitting: *Deleted

## 2016-04-21 DIAGNOSIS — I63412 Cerebral infarction due to embolism of left middle cerebral artery: Secondary | ICD-10-CM | POA: Diagnosis not present

## 2016-04-22 NOTE — Progress Notes (Signed)
Carelink Summary Report / Loop Recorder 

## 2016-04-29 ENCOUNTER — Ambulatory Visit (INDEPENDENT_AMBULATORY_CARE_PROVIDER_SITE_OTHER): Payer: Medicare Other | Admitting: Pharmacist

## 2016-04-29 DIAGNOSIS — I63412 Cerebral infarction due to embolism of left middle cerebral artery: Secondary | ICD-10-CM | POA: Diagnosis not present

## 2016-04-29 DIAGNOSIS — Z7901 Long term (current) use of anticoagulants: Secondary | ICD-10-CM | POA: Diagnosis not present

## 2016-04-29 LAB — POCT INR: INR: 2.8

## 2016-05-17 LAB — CUP PACEART REMOTE DEVICE CHECK: Date Time Interrogation Session: 20170826210956

## 2016-05-17 NOTE — Progress Notes (Signed)
Carelink summary report received. Battery status OK. Normal device function. No new symptom episodes, tachy episodes, brady, or pause episodes. 0.3% AF, V rates relatively well controlled, +Warfarin.  Monthly summary reports and ROV/PRN

## 2016-05-19 ENCOUNTER — Ambulatory Visit (INDEPENDENT_AMBULATORY_CARE_PROVIDER_SITE_OTHER): Payer: Medicare Other | Admitting: *Deleted

## 2016-05-19 DIAGNOSIS — I63412 Cerebral infarction due to embolism of left middle cerebral artery: Secondary | ICD-10-CM | POA: Diagnosis not present

## 2016-05-20 NOTE — Progress Notes (Signed)
Carelink Summary Report / Loop Recorder 

## 2016-05-27 ENCOUNTER — Ambulatory Visit (INDEPENDENT_AMBULATORY_CARE_PROVIDER_SITE_OTHER): Payer: Medicare Other | Admitting: Pharmacist Clinician (PhC)/ Clinical Pharmacy Specialist

## 2016-05-27 DIAGNOSIS — I63412 Cerebral infarction due to embolism of left middle cerebral artery: Secondary | ICD-10-CM | POA: Diagnosis not present

## 2016-05-27 DIAGNOSIS — Z7901 Long term (current) use of anticoagulants: Secondary | ICD-10-CM

## 2016-05-27 LAB — POCT INR: INR: 2.5

## 2016-06-08 ENCOUNTER — Other Ambulatory Visit: Payer: Self-pay | Admitting: Cardiology

## 2016-06-18 ENCOUNTER — Ambulatory Visit (INDEPENDENT_AMBULATORY_CARE_PROVIDER_SITE_OTHER): Payer: Medicare Other | Admitting: *Deleted

## 2016-06-18 DIAGNOSIS — I63412 Cerebral infarction due to embolism of left middle cerebral artery: Secondary | ICD-10-CM

## 2016-06-19 NOTE — Progress Notes (Signed)
Carelink Summary Report / Loop Recorder 

## 2016-06-21 LAB — CUP PACEART REMOTE DEVICE CHECK: MDC IDC SESS DTM: 20170925213921

## 2016-06-21 NOTE — Progress Notes (Signed)
Carelink summary report received. Battery status OK. Normal device function. No new symptom episodes, tachy episodes, or brady episodes. 1 pause- previously reviewed. 110 AF 1.4 % +warfarin. Monthly summary reports and ROV/PRN

## 2016-06-25 ENCOUNTER — Ambulatory Visit (INDEPENDENT_AMBULATORY_CARE_PROVIDER_SITE_OTHER): Payer: Medicare Other | Admitting: Pharmacist Clinician (PhC)/ Clinical Pharmacy Specialist

## 2016-06-25 DIAGNOSIS — I63412 Cerebral infarction due to embolism of left middle cerebral artery: Secondary | ICD-10-CM | POA: Diagnosis not present

## 2016-06-25 DIAGNOSIS — Z7901 Long term (current) use of anticoagulants: Secondary | ICD-10-CM | POA: Diagnosis not present

## 2016-06-25 LAB — POCT INR: INR: 4.8

## 2016-07-10 ENCOUNTER — Ambulatory Visit (INDEPENDENT_AMBULATORY_CARE_PROVIDER_SITE_OTHER): Payer: Medicare Other | Admitting: Pharmacist Clinician (PhC)/ Clinical Pharmacy Specialist

## 2016-07-10 DIAGNOSIS — Z7901 Long term (current) use of anticoagulants: Secondary | ICD-10-CM | POA: Diagnosis not present

## 2016-07-10 DIAGNOSIS — I63412 Cerebral infarction due to embolism of left middle cerebral artery: Secondary | ICD-10-CM

## 2016-07-10 LAB — POCT INR: INR: 4.9

## 2016-07-19 LAB — CUP PACEART REMOTE DEVICE CHECK
Date Time Interrogation Session: 20171025213819
MDC IDC PG IMPLANT DT: 20160304

## 2016-07-19 NOTE — Progress Notes (Signed)
Carelink summary report received. Battery status OK. Normal device function. No new symptom episodes, brady, or pause episodes. 1 tachy- ECG appears AF w/ RVR +Toprol 73 AF 0.8% +Warfarin. Monthly summary reports and ROV/PRN

## 2016-07-21 ENCOUNTER — Ambulatory Visit (INDEPENDENT_AMBULATORY_CARE_PROVIDER_SITE_OTHER): Payer: Medicare Other | Admitting: *Deleted

## 2016-07-21 DIAGNOSIS — I63412 Cerebral infarction due to embolism of left middle cerebral artery: Secondary | ICD-10-CM

## 2016-07-21 NOTE — Progress Notes (Signed)
Carelink Summary Report / Loop Recorder 

## 2016-07-23 ENCOUNTER — Other Ambulatory Visit: Payer: Self-pay | Admitting: Cardiology

## 2016-07-30 ENCOUNTER — Ambulatory Visit (INDEPENDENT_AMBULATORY_CARE_PROVIDER_SITE_OTHER): Payer: Medicare Other | Admitting: Pharmacist Clinician (PhC)/ Clinical Pharmacy Specialist

## 2016-07-30 DIAGNOSIS — I63412 Cerebral infarction due to embolism of left middle cerebral artery: Secondary | ICD-10-CM | POA: Diagnosis not present

## 2016-07-30 DIAGNOSIS — Z7901 Long term (current) use of anticoagulants: Secondary | ICD-10-CM | POA: Diagnosis not present

## 2016-07-30 LAB — POCT INR: INR: 3.2

## 2016-08-19 ENCOUNTER — Ambulatory Visit (INDEPENDENT_AMBULATORY_CARE_PROVIDER_SITE_OTHER): Payer: Medicare Other | Admitting: *Deleted

## 2016-08-19 DIAGNOSIS — I63412 Cerebral infarction due to embolism of left middle cerebral artery: Secondary | ICD-10-CM | POA: Diagnosis not present

## 2016-08-19 NOTE — Progress Notes (Signed)
Carelink Summary Report / Loop Recorder 

## 2016-08-20 ENCOUNTER — Ambulatory Visit (INDEPENDENT_AMBULATORY_CARE_PROVIDER_SITE_OTHER): Payer: Medicare Other | Admitting: Pharmacist

## 2016-08-20 DIAGNOSIS — I63412 Cerebral infarction due to embolism of left middle cerebral artery: Secondary | ICD-10-CM

## 2016-08-20 DIAGNOSIS — Z7901 Long term (current) use of anticoagulants: Secondary | ICD-10-CM

## 2016-08-20 LAB — POCT INR: INR: 2.2

## 2016-08-30 LAB — CUP PACEART REMOTE DEVICE CHECK
MDC IDC PG IMPLANT DT: 20160304
MDC IDC SESS DTM: 20171124223650

## 2016-08-30 NOTE — Progress Notes (Signed)
Carelink summary report received. Battery status OK. Normal device function. No new symptom episodes, tachy episodes, brady, or pause episodes. 98 AF 1.3% +warfarin. Monthly summary reports and ROV/PRN

## 2016-09-06 ENCOUNTER — Encounter (HOSPITAL_COMMUNITY): Payer: Self-pay | Admitting: Emergency Medicine

## 2016-09-06 ENCOUNTER — Emergency Department (HOSPITAL_COMMUNITY): Payer: Medicare Other

## 2016-09-06 ENCOUNTER — Observation Stay (HOSPITAL_COMMUNITY): Payer: Medicare Other

## 2016-09-06 ENCOUNTER — Observation Stay (HOSPITAL_COMMUNITY)
Admission: EM | Admit: 2016-09-06 | Discharge: 2016-09-09 | Disposition: A | Discharge status: Skilled Nursing Facility | Payer: Medicare Other | Attending: Internal Medicine | Admitting: Internal Medicine

## 2016-09-06 DIAGNOSIS — E119 Type 2 diabetes mellitus without complications: Secondary | ICD-10-CM

## 2016-09-06 DIAGNOSIS — I129 Hypertensive chronic kidney disease with stage 1 through stage 4 chronic kidney disease, or unspecified chronic kidney disease: Secondary | ICD-10-CM | POA: Insufficient documentation

## 2016-09-06 DIAGNOSIS — E1122 Type 2 diabetes mellitus with diabetic chronic kidney disease: Secondary | ICD-10-CM | POA: Insufficient documentation

## 2016-09-06 DIAGNOSIS — I252 Old myocardial infarction: Secondary | ICD-10-CM | POA: Diagnosis not present

## 2016-09-06 DIAGNOSIS — D631 Anemia in chronic kidney disease: Secondary | ICD-10-CM | POA: Diagnosis not present

## 2016-09-06 DIAGNOSIS — I2510 Atherosclerotic heart disease of native coronary artery without angina pectoris: Secondary | ICD-10-CM | POA: Diagnosis not present

## 2016-09-06 DIAGNOSIS — R443 Hallucinations, unspecified: Secondary | ICD-10-CM | POA: Insufficient documentation

## 2016-09-06 DIAGNOSIS — Z7901 Long term (current) use of anticoagulants: Secondary | ICD-10-CM | POA: Diagnosis not present

## 2016-09-06 DIAGNOSIS — Z85828 Personal history of other malignant neoplasm of skin: Secondary | ICD-10-CM | POA: Insufficient documentation

## 2016-09-06 DIAGNOSIS — E0822 Diabetes mellitus due to underlying condition with diabetic chronic kidney disease: Secondary | ICD-10-CM

## 2016-09-06 DIAGNOSIS — Z87891 Personal history of nicotine dependence: Secondary | ICD-10-CM | POA: Insufficient documentation

## 2016-09-06 DIAGNOSIS — Z794 Long term (current) use of insulin: Secondary | ICD-10-CM | POA: Insufficient documentation

## 2016-09-06 DIAGNOSIS — E785 Hyperlipidemia, unspecified: Secondary | ICD-10-CM | POA: Diagnosis present

## 2016-09-06 DIAGNOSIS — E872 Acidosis, unspecified: Secondary | ICD-10-CM

## 2016-09-06 DIAGNOSIS — Z955 Presence of coronary angioplasty implant and graft: Secondary | ICD-10-CM | POA: Diagnosis not present

## 2016-09-06 DIAGNOSIS — R531 Weakness: Secondary | ICD-10-CM

## 2016-09-06 DIAGNOSIS — J209 Acute bronchitis, unspecified: Secondary | ICD-10-CM | POA: Diagnosis not present

## 2016-09-06 DIAGNOSIS — I69351 Hemiplegia and hemiparesis following cerebral infarction affecting right dominant side: Secondary | ICD-10-CM | POA: Insufficient documentation

## 2016-09-06 DIAGNOSIS — I10 Essential (primary) hypertension: Secondary | ICD-10-CM | POA: Diagnosis present

## 2016-09-06 DIAGNOSIS — Z888 Allergy status to other drugs, medicaments and biological substances status: Secondary | ICD-10-CM | POA: Diagnosis not present

## 2016-09-06 DIAGNOSIS — N183 Chronic kidney disease, stage 3 unspecified: Secondary | ICD-10-CM

## 2016-09-06 DIAGNOSIS — I480 Paroxysmal atrial fibrillation: Secondary | ICD-10-CM | POA: Diagnosis present

## 2016-09-06 DIAGNOSIS — N184 Chronic kidney disease, stage 4 (severe): Secondary | ICD-10-CM | POA: Diagnosis not present

## 2016-09-06 DIAGNOSIS — R059 Cough, unspecified: Secondary | ICD-10-CM

## 2016-09-06 DIAGNOSIS — R05 Cough: Secondary | ICD-10-CM | POA: Diagnosis present

## 2016-09-06 DIAGNOSIS — M791 Myalgia: Secondary | ICD-10-CM | POA: Diagnosis not present

## 2016-09-06 DIAGNOSIS — A419 Sepsis, unspecified organism: Secondary | ICD-10-CM | POA: Diagnosis not present

## 2016-09-06 LAB — CBC WITH DIFFERENTIAL/PLATELET
BASOS ABS: 0 10*3/uL (ref 0.0–0.1)
Basophils Relative: 0 %
EOS ABS: 0.1 10*3/uL (ref 0.0–0.7)
EOS PCT: 1 %
HCT: 39.8 % (ref 39.0–52.0)
Hemoglobin: 12.8 g/dL — ABNORMAL LOW (ref 13.0–17.0)
LYMPHS ABS: 2.1 10*3/uL (ref 0.7–4.0)
LYMPHS PCT: 19 %
MCH: 27.8 pg (ref 26.0–34.0)
MCHC: 32.2 g/dL (ref 30.0–36.0)
MCV: 86.5 fL (ref 78.0–100.0)
MONO ABS: 0.8 10*3/uL (ref 0.1–1.0)
Monocytes Relative: 8 %
Neutro Abs: 7.8 10*3/uL — ABNORMAL HIGH (ref 1.7–7.7)
Neutrophils Relative %: 72 %
PLATELETS: 277 10*3/uL (ref 150–400)
RBC: 4.6 MIL/uL (ref 4.22–5.81)
RDW: 14.5 % (ref 11.5–15.5)
WBC: 11.9 10*3/uL — AB (ref 4.0–10.5)

## 2016-09-06 LAB — COMPREHENSIVE METABOLIC PANEL
ALT: 19 U/L (ref 17–63)
AST: 25 U/L (ref 15–41)
Albumin: 3.2 g/dL — ABNORMAL LOW (ref 3.5–5.0)
Alkaline Phosphatase: 41 U/L (ref 38–126)
Anion gap: 11 (ref 5–15)
BILIRUBIN TOTAL: 1.1 mg/dL (ref 0.3–1.2)
BUN: 33 mg/dL — AB (ref 6–20)
CO2: 23 mmol/L (ref 22–32)
CREATININE: 2.95 mg/dL — AB (ref 0.61–1.24)
Calcium: 9.3 mg/dL (ref 8.9–10.3)
Chloride: 103 mmol/L (ref 101–111)
GFR calc Af Amer: 22 mL/min — ABNORMAL LOW (ref >60)
GFR, EST NON AFRICAN AMERICAN: 19 mL/min — AB (ref >60)
GLUCOSE: 230 mg/dL — AB (ref 65–99)
POTASSIUM: 4.2 mmol/L (ref 3.5–5.1)
Sodium: 137 mmol/L (ref 135–145)
TOTAL PROTEIN: 7.1 g/dL (ref 6.5–8.1)

## 2016-09-06 LAB — URINALYSIS, ROUTINE W REFLEX MICROSCOPIC
Bacteria, UA: NONE SEEN
Bilirubin Urine: NEGATIVE
Ketones, ur: 5 mg/dL — AB
Nitrite: NEGATIVE
Protein, ur: 30 mg/dL — AB
SPECIFIC GRAVITY, URINE: 1.016 (ref 1.005–1.030)
pH: 6 (ref 5.0–8.0)

## 2016-09-06 LAB — I-STAT CG4 LACTIC ACID, ED
LACTIC ACID, VENOUS: 0.91 mmol/L (ref 0.5–1.9)
Lactic Acid, Venous: 2.91 mmol/L (ref 0.5–1.9)

## 2016-09-06 LAB — INFLUENZA PANEL BY PCR (TYPE A & B)
INFLAPCR: NEGATIVE
INFLBPCR: NEGATIVE

## 2016-09-06 LAB — PROTIME-INR
INR: 1.33
Prothrombin Time: 16.6 seconds — ABNORMAL HIGH (ref 11.4–15.2)

## 2016-09-06 LAB — GLUCOSE, CAPILLARY
GLUCOSE-CAPILLARY: 259 mg/dL — AB (ref 65–99)
Glucose-Capillary: 189 mg/dL — ABNORMAL HIGH (ref 65–99)

## 2016-09-06 LAB — PROCALCITONIN: PROCALCITONIN: 0.19 ng/mL

## 2016-09-06 LAB — APTT: aPTT: 42 seconds — ABNORMAL HIGH (ref 24–36)

## 2016-09-06 MED ORDER — FENOFIBRATE 160 MG PO TABS
160.0000 mg | ORAL_TABLET | Freq: Every day | ORAL | Status: DC
  Administered 2016-09-06 – 2016-09-09 (×4): 160 mg via ORAL
  Filled 2016-09-06 (×4): qty 1

## 2016-09-06 MED ORDER — ONDANSETRON HCL 4 MG PO TABS: 4.0000 mg | ORAL_TABLET | Freq: Four times a day (QID) | ORAL | Status: DC | PRN

## 2016-09-06 MED ORDER — IPRATROPIUM-ALBUTEROL 0.5-2.5 (3) MG/3ML IN SOLN: 3.0000 mL | RESPIRATORY_TRACT | Status: DC | PRN

## 2016-09-06 MED ORDER — PRAVASTATIN SODIUM 40 MG PO TABS
80.0000 mg | ORAL_TABLET | Freq: Every day | ORAL | Status: DC
  Administered 2016-09-06 – 2016-09-09 (×4): 80 mg via ORAL
  Filled 2016-09-06 (×4): qty 2

## 2016-09-06 MED ORDER — VANCOMYCIN HCL IN DEXTROSE 1-5 GM/200ML-% IV SOLN
1000.0000 mg | Freq: Once | INTRAVENOUS | Status: DC
  Administered 2016-09-06: 1000 mg via INTRAVENOUS
  Filled 2016-09-06: qty 200

## 2016-09-06 MED ORDER — INSULIN ASPART 100 UNIT/ML ~~LOC~~ SOLN
0.0000 [IU] | Freq: Three times a day (TID) | SUBCUTANEOUS | Status: DC
  Administered 2016-09-06: 5 [IU] via SUBCUTANEOUS
  Administered 2016-09-07: 2 [IU] via SUBCUTANEOUS
  Administered 2016-09-07: 1 [IU] via SUBCUTANEOUS

## 2016-09-06 MED ORDER — POLYETHYLENE GLYCOL 3350 17 G PO PACK
17.0000 g | PACK | Freq: Every day | ORAL | Status: DC | PRN
Start: 2016-09-06 — End: 2016-09-09

## 2016-09-06 MED ORDER — PIPERACILLIN-TAZOBACTAM 3.375 G IVPB 30 MIN
3.3750 g | Freq: Once | INTRAVENOUS | Status: DC
  Administered 2016-09-06: 3.375 g via INTRAVENOUS
  Filled 2016-09-06: qty 50

## 2016-09-06 MED ORDER — ACETAMINOPHEN 650 MG RE SUPP: 650.0000 mg | Freq: Four times a day (QID) | RECTAL | Status: DC | PRN

## 2016-09-06 MED ORDER — SODIUM CHLORIDE 0.9 % IV BOLUS (SEPSIS)
1000.0000 mL | Freq: Once | INTRAVENOUS | Status: AC
Start: 2016-09-06 — End: 2016-09-06
  Administered 2016-09-06: 1000 mL via INTRAVENOUS

## 2016-09-06 MED ORDER — TRAZODONE HCL 50 MG PO TABS: 25.0000 mg | ORAL_TABLET | Freq: Every evening | ORAL | Status: DC | PRN

## 2016-09-06 MED ORDER — LEVOFLOXACIN IN D5W 500 MG/100ML IV SOLN
500.0000 mg | INTRAVENOUS | Status: DC
  Administered 2016-09-06: 500 mg via INTRAVENOUS
  Filled 2016-09-06 (×2): qty 100

## 2016-09-06 MED ORDER — SODIUM CHLORIDE 0.9 % IV BOLUS (SEPSIS)
1000.0000 mL | Freq: Once | INTRAVENOUS | Status: AC
  Administered 2016-09-06: 1000 mL via INTRAVENOUS

## 2016-09-06 MED ORDER — ACETAMINOPHEN 325 MG PO TABS
650.0000 mg | ORAL_TABLET | Freq: Once | ORAL | Status: AC
  Administered 2016-09-06: 650 mg via ORAL
  Filled 2016-09-06: qty 2

## 2016-09-06 MED ORDER — ONDANSETRON HCL 4 MG/2ML IJ SOLN: 4.0000 mg | Freq: Four times a day (QID) | INTRAMUSCULAR | Status: DC | PRN

## 2016-09-06 MED ORDER — IPRATROPIUM-ALBUTEROL 0.5-2.5 (3) MG/3ML IN SOLN
3.0000 mL | RESPIRATORY_TRACT | Status: DC
Start: 2016-09-06 — End: 2016-09-06

## 2016-09-06 MED ORDER — FUROSEMIDE 20 MG PO TABS: 20.0000 mg | ORAL_TABLET | Freq: Every day | ORAL | Status: DC

## 2016-09-06 MED ORDER — SODIUM CHLORIDE 0.9 % IV SOLN
INTRAVENOUS | Status: DC
  Administered 2016-09-06 – 2016-09-07 (×3): via INTRAVENOUS

## 2016-09-06 MED ORDER — METOPROLOL SUCCINATE ER 50 MG PO TB24
50.0000 mg | ORAL_TABLET | Freq: Every day | ORAL | Status: DC
  Administered 2016-09-06 – 2016-09-09 (×4): 50 mg via ORAL
  Filled 2016-09-06 (×4): qty 1

## 2016-09-06 MED ORDER — SODIUM CHLORIDE 0.9% FLUSH
3.0000 mL | Freq: Two times a day (BID) | INTRAVENOUS | Status: DC
  Administered 2016-09-06 – 2016-09-09 (×4): 3 mL via INTRAVENOUS

## 2016-09-06 MED ORDER — AMLODIPINE BESYLATE 5 MG PO TABS
5.0000 mg | ORAL_TABLET | Freq: Every day | ORAL | Status: DC
  Administered 2016-09-06 – 2016-09-09 (×4): 5 mg via ORAL
  Filled 2016-09-06 (×4): qty 1

## 2016-09-06 MED ORDER — INSULIN GLARGINE 100 UNIT/ML ~~LOC~~ SOLN
30.0000 [IU] | Freq: Every day | SUBCUTANEOUS | Status: DC
  Administered 2016-09-06 – 2016-09-08 (×3): 30 [IU] via SUBCUTANEOUS
  Filled 2016-09-06 (×5): qty 0.3

## 2016-09-06 MED ORDER — WARFARIN SODIUM 7.5 MG PO TABS
7.5000 mg | ORAL_TABLET | Freq: Once | ORAL | Status: AC
  Administered 2016-09-06: 7.5 mg via ORAL
  Filled 2016-09-06 (×2): qty 1

## 2016-09-06 MED ORDER — VITAMIN B-12 100 MCG PO TABS
100.0000 ug | ORAL_TABLET | Freq: Every day | ORAL | Status: DC
  Administered 2016-09-06 – 2016-09-09 (×4): 100 ug via ORAL
  Filled 2016-09-06 (×4): qty 1

## 2016-09-06 MED ORDER — IPRATROPIUM-ALBUTEROL 0.5-2.5 (3) MG/3ML IN SOLN: 3.0000 mL | Freq: Four times a day (QID) | RESPIRATORY_TRACT | Status: AC

## 2016-09-06 MED ORDER — ASPIRIN EC 81 MG PO TBEC
81.0000 mg | DELAYED_RELEASE_TABLET | Freq: Every day | ORAL | Status: DC
  Administered 2016-09-06 – 2016-09-09 (×4): 81 mg via ORAL
  Filled 2016-09-06 (×4): qty 1

## 2016-09-06 MED ORDER — WARFARIN - PHARMACIST DOSING INPATIENT
Freq: Every day | Status: DC
Start: 2016-09-06 — End: 2016-09-09
  Administered 2016-09-06 – 2016-09-07 (×2)

## 2016-09-06 MED ORDER — GUAIFENESIN ER 600 MG PO TB12
600.0000 mg | ORAL_TABLET | Freq: Two times a day (BID) | ORAL | Status: DC
  Administered 2016-09-06 – 2016-09-09 (×5): 600 mg via ORAL
  Filled 2016-09-06 (×6): qty 1

## 2016-09-06 MED ORDER — ACETAMINOPHEN 325 MG PO TABS
650.0000 mg | ORAL_TABLET | Freq: Four times a day (QID) | ORAL | Status: DC | PRN
  Administered 2016-09-09: 650 mg via ORAL
  Filled 2016-09-06: qty 2

## 2016-09-06 NOTE — ED Provider Notes (Signed)
[facility] DEPT Provider Note   CSN: [id number] Arrival date & time: 09/06/16  1055     History   Chief Complaint Chief Complaint  Patient presents with  . Fatigue    The patient's wife says she has been sick and was hoping her husband wouldn not get it.  She says he has been coughing and swallowing the phlegm.  Accroding to the wife, he had a stroke a year ago and never regained his strength.  His right arm is weak from the stroke and cannot use his hand.  Wife also said he cannot walk on his own.    . Cough    HPI [name] [name] is a 76 y.o. male.  HPI  Patient has PMH problems including CAD, diabetes, hyperlipidemia, hypertension, MI, SVT, paroxysmal a. Fib, CKD, stroke, with complaints of coughing and fatigue. He is weak to right arm at baseline but currently he is too weak to walk on his own. She reports that he has had fever. Denies having dysuria, headache, back pain, neck pain.   Past Medical History:  Diagnosis Date  . CAD   . Cancer ([place])    skin  . CEREBROVASCULAR ACCIDENT   . CEREBROVASCULAR DISEASE   . DIABETES MELLITUS   . HYPERLIPIDEMIA   . HYPERTENSION   . MYOCARDIAL INFARCTION    x 2  . RENAL INSUFFICIENCY   . SUPRAVENTRICULAR TACHYCARDIA     Patient Active Problem List   Diagnosis Date Noted  . Long-term (current) use of anticoagulants 05/10/2015  . Tobacco abuse 05/07/2015  . 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 Paroxysmal atrial fibrillation ([place]) 12/15/2014  . CKD (chronic kidney disease) 12/15/2014  . Hyperlipidemia 12/15/2014  . Type 2 diabetes mellitus with other circulatory complications 123456  . B12 deficiency anemia 12/10/2014  . Right hemiparesis ([place]) 10/26/2014  . Diabetes mellitus ([place]) 10/26/2014  . Essential hypertension   . HLD (hyperlipidemia)   . Stroke ([place]) 10/25/2014  . Cerebrovascular disease 12/31/2009  . MURMUR 12/12/2008  . MYOCARDIAL INFARCTION 12/11/2008  .  ANGINA, UNSTABLE 12/11/2008  . Coronary atherosclerosis 12/11/2008  . SUPRAVENTRICULAR TACHYCARDIA 12/11/2008  . Cerebral artery occlusion with cerebral infarction ([place]) 12/11/2008  . Disorder resulting from impaired renal function 12/11/2008  . CAROTID BRUIT 12/11/2008  . HYPERGLYCEMIA 12/11/2008    Past Surgical History:  Procedure Laterality Date  . CORONARY STENT PLACEMENT  2002, 2003   x 4  . LOOP RECORDER IMPLANT N/A 10/27/2014   Procedure: LOOP RECORDER IMPLANT;  Surgeon: Deboraha Sprang, MD;  Location: [facility] CATH LAB;  Service: Cardiovascular;  Laterality: N/A;  . TEE WITHOUT CARDIOVERSION N/A 10/27/2014   Procedure: TRANSESOPHAGEAL ECHOCARDIOGRAM (TEE);  Surgeon: Dorothy Spark, MD;  Location: [facility] ENDOSCOPY;  Service: Cardiovascular;  Laterality: N/A;       Home Medications    Prior to Admission medications   Medication Sig Start Date End Date Taking? Authorizing Provider  amLODipine (NORVASC) 5 MG tablet Take 5 mg by mouth daily. 04/16/15  Yes Historical Provider, MD  fenofibrate 160 MG tablet Take 160 mg by mouth daily. 04/16/15  Yes Historical Provider, MD  furosemide (LASIX) 20 MG tablet Take 20 mg by mouth.   Yes Historical Provider, MD  glipiZIDE (GLUCOTROL) 5 MG tablet Take 5 mg by mouth 2 (two) times daily before a meal.    Yes Historical Provider, MD  insulin glargine (LANTUS) 100 UNIT/ML injection Inject 0.3 mLs (30 Units total) into the  skin at bedtime. 10/31/14  Yes Reyne Dumas, MD  metoprolol (TOPROL-XL) 50 MG 24 hr tablet Take 50 mg by mouth daily.     Yes Historical Provider, MD  pravastatin (PRAVACHOL) 80 MG tablet Take 80 mg by mouth daily.   Yes Historical Provider, MD  vitamin B-12 (CYANOCOBALAMIN) 100 MCG tablet Take 100 mcg by mouth daily.   Yes Historical Provider, MD  warfarin (COUMADIN) 5 MG tablet TAKE 1 TABLET BY MOUTH DAILY OR AS DIRECTED BY COUMADIN CLINIC 07/24/16  Yes [name], MD    Family History Family History  Problem Relation Age of  Onset  . Stroke Father     Social History Social History  Substance Use Topics  . Smoking status: Former Research scientist (life sciences)  . Smokeless tobacco: Never Used  . Alcohol use No     Allergies   Ace inhibitors   Review of Systems Review of Systems Review of Systems All other systems negative except as documented in the HPI. All pertinent positives and negatives as reviewed in the HPI.   Physical Exam Updated Vital Signs BP 147/75   Pulse 103   Temp 100.9 F (38.3 C) (Rectal)   Resp (!) 27   Ht 6\' 1"  (1.854 m)   Wt 81.6 kg   SpO2 95%   BMI 23.75 kg/m   Physical Exam  Constitutional: He appears well-developed and well-nourished. He has a sickly appearance. No distress.  HENT:  Head: Normocephalic and atraumatic.  Eyes: Conjunctivae and EOM are normal. Pupils are equal, round, and reactive to light. No scleral icterus.  Neck: Normal range of motion. Neck supple.  Cardiovascular: Normal rate, regular rhythm, normal heart sounds and intact distal pulses.   Pulmonary/Chest: Effort normal. No respiratory distress. He has no wheezes. He has no rales. He exhibits no tenderness.  Abdominal: Soft. There is no tenderness.  Musculoskeletal: He exhibits no edema or tenderness.  Neurological: He is alert. He displays a negative Romberg sign.  No facial paralysis or slurring of speech, patient is alert and oriented to self. Unable to asses cranial nerves d/t pts inability to follow commands.    Skin: Skin is warm. No petechiae, no purpura and no rash noted. He is diaphoretic.  Psychiatric: His speech is not slurred. Cognition and memory are not impaired.  Nursing note and vitals reviewed.  ED Treatments / Results  Labs (all labs ordered are listed, but only abnormal results are displayed) Labs Reviewed  PROTIME-INR - Abnormal; Notable for the following:       Result Value   Prothrombin Time 16.6 (*)    All other components within normal limits  COMPREHENSIVE METABOLIC PANEL - Abnormal;  Notable for the following:    Glucose, Bld 230 (*)    BUN 33 (*)    Creatinine, Ser 2.95 (*)    Albumin 3.2 (*)    GFR calc non Af Amer 19 (*)    GFR calc Af Amer 22 (*)    All other components within normal limits  CBC WITH DIFFERENTIAL/PLATELET - Abnormal; Notable for the following:    WBC 11.9 (*)    Hemoglobin 12.8 (*)    Neutro Abs 7.8 (*)    All other components within normal limits  URINALYSIS, ROUTINE W REFLEX MICROSCOPIC - Abnormal; Notable for the following:    APPearance HAZY (*)    Glucose, UA >=500 (*)    Hgb urine dipstick MODERATE (*)    Ketones, ur 5 (*)    Protein, ur 30 (*)  Leukocytes, UA SMALL (*)    Squamous Epithelial / LPF 0-5 (*)    All other components within normal limits  I-STAT CG4 LACTIC ACID, ED - Abnormal; Notable for the following:    Lactic Acid, Venous 2.91 (*)    All other components within normal limits  CULTURE, BLOOD (ROUTINE X 2)  CULTURE, BLOOD (ROUTINE X 2)  URINE CULTURE  INFLUENZA PANEL BY PCR (TYPE A & B, H1N1)    EKG  EKG Interpretation None       Radiology Dg Chest 2 View  Result Date: 09/06/2016 CLINICAL DATA:  Pt from home with fever, weakness, and fatigue. Pt states his symptoms began 3 days ago. Wife with same illness. Hx HTN, CAD, MI x2 EXAM: CHEST  2 VIEW COMPARISON:  11/25/2007 FINDINGS: The heart size and mediastinal contours are within normal limits. Both lungs are clear. No pleural effusion or pneumothorax. Skeletal structures are intact. IMPRESSION: No active cardiopulmonary disease. Electronically Signed   By: Lajean Manes M.D.   On: 09/06/2016 12:32    Procedures Procedures (including critical care time)  Medications Ordered in ED Medications  sodium chloride 0.9 % bolus 1,000 mL (1,000 mLs Intravenous New Bag/Given 09/06/16 1307)  sodium chloride 0.9 % bolus 1,000 mL (not administered)  piperacillin-tazobactam (ZOSYN) IVPB 3.375 g (not administered)  vancomycin (VANCOCIN) IVPB 1000 mg/200 mL premix (not  administered)  acetaminophen (TYLENOL) tablet 650 mg (650 mg Oral Given 09/06/16 1346)     Initial Impression / Assessment and Plan / ED Course  I have reviewed the triage vital signs and the nursing notes.  Pertinent labs & imaging results that were available during my care of the patient were reviewed by me and considered in my medical decision making (see chart for details).  Clinical Course    Elevated lactic acid. Flu swab pending. Source of infection is unknown, pt treat empirically with IV abx and blood cultures sent out. Pt admitted to obs/tele, Dr. Olevia Bowens, D. [facility] admits, [facility].  Blood pressure 147/75, pulse 103, temperature 100.9 F (38.3 C), temperature source Rectal, resp. rate (!) 27, height 6\' 1"  (1.854 m), weight 81.6 kg, SpO2 95 %.   Final Clinical Impressions(s) / ED Diagnoses   Final diagnoses:  Cough  Weakness  Lactic acid acidosis    New Prescriptions New Prescriptions   No medications on file     [name] [name], Hershal Coria 09/06/16 [place], MD 09/06/16 1530

## 2016-09-06 NOTE — Progress Notes (Signed)
ANTICOAGULATION CONSULT NOTE - Initial Consult  Pharmacy Consult for Coumadin Indication: atrial fibrillation  Allergies  Allergen Reactions  . Ace Inhibitors Other (See Comments)    Renal failure    Patient Measurements: Height: 6\' 1"  (185.4 cm) Weight: 180 lb (81.6 kg) IBW/kg (Calculated) : 79.9  Vital Signs: Temp: 100.9 F (38.3 C) (01/13 1303) Temp Source: Rectal (01/13 1303) BP: 153/75 (01/13 1426) Pulse Rate: 93 (01/13 1426)  Labs:  Recent Labs  09/06/16 1157  HGB 12.8*  HCT 39.8  PLT 277  LABPROT 16.6*  INR 1.33  CREATININE 2.95*    Estimated Creatinine Clearance: 23.7 mL/min (by C-G formula based on SCr of 2.95 mg/dL (H)).   Medical History: Past Medical History:  Diagnosis Date  . CAD   . Cancer ([place])    skin  . CEREBROVASCULAR ACCIDENT   . CEREBROVASCULAR DISEASE   . DIABETES MELLITUS   . HYPERLIPIDEMIA   . HYPERTENSION   . MYOCARDIAL INFARCTION    x 2  . RENAL INSUFFICIENCY   . SUPRAVENTRICULAR TACHYCARDIA     Assessment: 76 year old male on Coumadin PTA for afib, INR on admission = 1.33 Dose PTA = 2.5 mg po daily x 5 mg on Saturdays  Started on Levofloxacin this admission for pneumonia?  Goal of Therapy:  INR 2-3 Monitor platelets by anticoagulation protocol: Yes   Plan:  Coumadin 7.5 mg po x 1 dose at 1800 pm Daily INR  Thank you Anette Guarneri, PharmD [phone]  09/06/2016,3:23 PM

## 2016-09-06 NOTE — ED Notes (Signed)
istat in mini lab. 

## 2016-09-06 NOTE — H&P (Signed)
History and Physical    [name] [name] [id number] DOB: [birth year] DOA: 09/06/2016  PCP: No PCP Per Patient   Patient coming from: [facility]  Chief Complaint: Progressive weakness, productive cough, myalgia  HPI: [name] [name] is a 76 y.o. male with medical history significant of CAD, paroxysmal A. Fib on Coumadin anticoagulation therapy, CVA with residual right-sided weakness, hyperlipidemia, hypertension, diabetes mellitus who presented to the emergency department with complaints increasing weakness and cough with thick phlegm. Patient's wife said that he has been weak on the right side the stroke, but was able to ambulate using a walker. However during the last 8 hours he became progressively weak and unable to ambulate even with a walker and has been having difficulties to get out of bed or chair. Patient also had low-grade fevers at home and mild myalgia.  ED Course: On presentation his vital signs showed temperature 100.9, he was tachypneic with RR 29, Blood work showed leukocytosis with WBC count of 11,900, creatinine elevated at 2.9 T5, which is not far from his baseline 2.77, lactic acid elevated at 2.91 Patient underwent chest x-ray that showed acute cardiopulmonary findings  Review of Systems: As per HPI otherwise 10 point review of systems negative.   Ambulatory Status: Ambulates with a walker  Past Medical History:  Diagnosis Date  . CAD   . Cancer ([place])    skin  . CEREBROVASCULAR ACCIDENT   . CEREBROVASCULAR DISEASE   . DIABETES MELLITUS   . HYPERLIPIDEMIA   . HYPERTENSION   . MYOCARDIAL INFARCTION    x 2  . RENAL INSUFFICIENCY   . SUPRAVENTRICULAR TACHYCARDIA     Past Surgical History:  Procedure Laterality Date  . CORONARY STENT PLACEMENT  2002, 2003   x 4  . LOOP RECORDER IMPLANT N/A 10/27/2014   Procedure: LOOP RECORDER IMPLANT;  Surgeon: Deboraha Sprang, MD;  Location: [facility] CATH LAB;  Service: Cardiovascular;  Laterality: N/A;  . TEE WITHOUT CARDIOVERSION N/A  10/27/2014   Procedure: TRANSESOPHAGEAL ECHOCARDIOGRAM (TEE);  Surgeon: Dorothy Spark, MD;  Location: [facility] ENDOSCOPY;  Service: Cardiovascular;  Laterality: N/A;    Social History   Social History  . Marital status: Married    Spouse name: Gene  . Number of children: 0  . Years of education: [place]   Occupational History  . Retired Other   Social History Main Topics  . Smoking status: Former Research scientist (life sciences)  . Smokeless tobacco: Never Used  . Alcohol use No  . Drug use: No  . Sexual activity: Not on file   Other Topics Concern  . Not on file   Social History Narrative   Patient lives at home with spouse.   Caffiene: 8 to 12oz daily    Allergies  Allergen Reactions  . Ace Inhibitors Other (See Comments)    Renal failure    Family History  Problem Relation Age of Onset  . Stroke Father     Prior to Admission medications   Medication Sig Start Date End Date Taking? Authorizing Provider  amLODipine (NORVASC) 5 MG tablet Take 5 mg by mouth daily. 04/16/15  Yes Historical Provider, MD  fenofibrate 160 MG tablet Take 160 mg by mouth daily. 04/16/15  Yes Historical Provider, MD  furosemide (LASIX) 20 MG tablet Take 20 mg by mouth.   Yes Historical Provider, MD  glipiZIDE (GLUCOTROL) 5 MG tablet Take 5 mg by mouth 2 (two) times daily before a meal.    Yes Historical Provider, MD  insulin glargine (LANTUS) 100 UNIT/ML  injection Inject 0.3 mLs (30 Units total) into the skin at bedtime. 10/31/14  Yes Reyne Dumas, MD  metoprolol (TOPROL-XL) 50 MG 24 hr tablet Take 50 mg by mouth daily.     Yes Historical Provider, MD  pravastatin (PRAVACHOL) 80 MG tablet Take 80 mg by mouth daily.   Yes Historical Provider, MD  vitamin B-12 (CYANOCOBALAMIN) 100 MCG tablet Take 100 mcg by mouth daily.   Yes Historical Provider, MD  warfarin (COUMADIN) 5 MG tablet TAKE 1 TABLET BY MOUTH DAILY OR AS DIRECTED BY COUMADIN CLINIC 07/24/16  Yes [name], MD    Physical Exam: Vitals:   09/06/16 1600  09/06/16 1705 09/06/16 1710 09/06/16 1750  BP: 138/76  (!) 155/67 (!) 146/83  Pulse: 86  85 (!) 116  Resp: 25  17 (!) 28  Temp:   98 F (36.7 C)   TempSrc:   Oral   SpO2: 93%  95% 95%  Weight:  81.3 kg (179 lb 3.2 oz)    Height:  5\' 10"  (1.778 m)       General: Appears calm and comfortable Eyes: PERRLA, EOMI, normal lids, iris ENT:  grossly normal hearing, lips & tongue, mucous membranes moist and intact Neck: no lymphoadenopathy, masses or thyromegaly Cardiovascular: RRR, no m/r/g. No JVD, carotid bruits. No LE edema.  Respiratory: bilateral wheezes, rales, and rhonchi. Normal respiratory effort. No accessory muscle use observed Abdomen: soft, non-tender, non-distended, no organomegaly or masses appreciated. BS present in all quadrants Skin: no rash, ulcers or induration seen on limited exam Musculoskeletal: grossly normal tone BUE/BLE, good ROM, no bony abnormality or joint deformities observed Psychiatric: grossly normal mood and affect, speech fluent and appropriate, alert and oriented x3 Neurologic: CN II-XII grossly intact, moves all extremities in coordinated fashion, sensation intact  Labs on Admission: I have personally reviewed following labs and imaging studies  CBC, BMP  GFR: Estimated Creatinine Clearance: 21.7 mL/min (by C-G formula based on SCr of 2.95 mg/dL (H)).   Creatinine Clearance: Estimated Creatinine Clearance: 21.7 mL/min (by C-G formula based on SCr of 2.95 mg/dL (H)).    Radiological Exams on Admission: Dg Chest 2 View  Result Date: 09/06/2016 CLINICAL DATA:  Pt from home with fever, weakness, and fatigue. Pt states his symptoms began 3 days ago. Wife with same illness. Hx HTN, CAD, MI x2 EXAM: CHEST  2 VIEW COMPARISON:  11/25/2007 FINDINGS: The heart size and mediastinal contours are within normal limits. Both lungs are clear. No pleural effusion or pneumothorax. Skeletal structures are intact. IMPRESSION: No active cardiopulmonary disease.  Electronically Signed   By: Lajean Manes M.D.   On: 09/06/2016 12:32    EKG: Independently reviewed - pending Assessment/Plan Principal Problem:   Acute bronchitis Active Problems:   Coronary atherosclerosis   Diabetes mellitus ([place])   Essential hypertension   HLD (hyperlipidemia)   Paroxysmal atrial fibrillation (HCC)   Sepsis ([place])   Sepsis - secondary to respiratory infection Admitted on sepsis protocol Treat underlying cause, monitor Lactic Acid  Acute bronchitis Start antibiotics - Levaquin IV Provide supplemental oxygen as needed, nebulizer therapy At this point will refrain from starting steroid therapy as patient is a diabetic. Wheezing persists might get a dose of IV steroids Start Guaifenesin to help liquefy and expectorate sputum Influenza PCR result is pending - if positive, patient will be placed on Tamiflu He received two boluses of 1L NS in the ED. Continue gentle hydration, follow Lactic acid, check Procalcitonin  Diabetes mellitus Continue home dose  of basal insulin, add sliding scale insulin, hold Glucotrol Check hemoglobin A1c, continue carb modified diet, CBG monitoring  CKD stage III with mild worsening on admission Avoid nephrotoxic medications, he will be given gentle hydration Follow renal indices in the morning  PAF - continue Toprol and Coumadin per pharmacy  HTN Continue Norvasc and Lopressor and monitor BP response   DVT prophylaxis: Coumadin Code Status: Full Family Communication: wife Disposition Plan: tele MedSurg unit Consults called: none Admission status: observation   York Grice, [place] Pager: [phone] [facility]  If 7PM-7AM, please contact night-coverage [email or web address] Password [facility]  09/06/2016, 6:05 PM

## 2016-09-06 NOTE — ED Notes (Signed)
Patient transported to X-ray 

## 2016-09-06 NOTE — ED Triage Notes (Signed)
The patient's wife says she has been sick and was hoping her husband wouldn not get it.  She says he has been coughing and swallowing the phlegm.  Accroding to the wife, he had a stroke a year ago and never regained his strength.  His right arm is weak from the stroke and cannot use his hand.  Wife also said he cannot walk on his own.   Wife says he has had a fever.

## 2016-09-06 NOTE — ED Notes (Signed)
Brought patient back to room via wheelchair with family in tow; [place], [place] assisted me with getting patient from wheelchair onto the stretcher; patient undressed, in gown, on monitor, continuous pulse oximetry and blood pressure cuff; visitor at bedside

## 2016-09-06 NOTE — Progress Notes (Signed)
REceivedreport from [place]

## 2016-09-06 NOTE — Progress Notes (Addendum)
Lajuane Lender [id number] Admission Data: 09/06/2016 4:57 PM Attending Provider: Reubin Milan, MD  PCP:No PCP Per Patient Consults/ Treatment Team:   [name] [name] is a 76 y.o. male patient admitted from ED awake, alert  & orientated  X 3,  Full Code, VSS - Blood pressure 138/76, pulse 86, temperature 100.9 F (38.3 C), temperature source Rectal, resp. rate 25, height 6\' 1"  (1.854 m), weight 81.6 kg (180 lb), SpO2 93 %.,no c/o shortness of breath, no c/o chest pain, no distress noted. Tele # 10 placed and pt is currently running:Afib    IV site WDL:  forearm right, condition patent and no redness and left, condition patent and no redness with a transparent dsg that's clean dry and intact.  Allergies:   Allergies  Allergen Reactions  . Ace Inhibitors Other (See Comments)    Renal failure     Past Medical History:  Diagnosis Date  . CAD   . Cancer ([place])    skin  . CEREBROVASCULAR ACCIDENT   . CEREBROVASCULAR DISEASE   . DIABETES MELLITUS   . HYPERLIPIDEMIA   . HYPERTENSION   . MYOCARDIAL INFARCTION    x 2  . RENAL INSUFFICIENCY   . SUPRAVENTRICULAR TACHYCARDIA     History:  obtained from the patient. Tobacco/alcohol: denied none  Pt orientation to unit, room and routine. Information packet given to patient/family and safety video watched.  Admission INP armband ID verified with patient/family, and in place. SR up x 2, fall risk assessment complete with Patient and family verbalizing understanding of risks associated with falls. Pt verbalizes an understanding of how to use the call bell and to call for help before getting out of bed.  Skin, clean-dry- intact without evidence of bruising, or skin tears.   Patient has scab on L elbow, RLE and butt. Also has callus on both feet , plantar surface.     Will cont to monitor and assist as needed.  [place] Cassetta Margaretha Sheffield, RN 09/06/2016 4:57 PM

## 2016-09-07 DIAGNOSIS — A419 Sepsis, unspecified organism: Secondary | ICD-10-CM

## 2016-09-07 DIAGNOSIS — I10 Essential (primary) hypertension: Secondary | ICD-10-CM | POA: Diagnosis not present

## 2016-09-07 DIAGNOSIS — I480 Paroxysmal atrial fibrillation: Secondary | ICD-10-CM

## 2016-09-07 DIAGNOSIS — E785 Hyperlipidemia, unspecified: Secondary | ICD-10-CM | POA: Diagnosis not present

## 2016-09-07 DIAGNOSIS — J209 Acute bronchitis, unspecified: Secondary | ICD-10-CM

## 2016-09-07 LAB — CBC
HCT: 34.4 % — ABNORMAL LOW (ref 39.0–52.0)
HEMOGLOBIN: 10.9 g/dL — AB (ref 13.0–17.0)
MCH: 27.4 pg (ref 26.0–34.0)
MCHC: 31.7 g/dL (ref 30.0–36.0)
MCV: 86.4 fL (ref 78.0–100.0)
PLATELETS: 204 10*3/uL (ref 150–400)
RBC: 3.98 MIL/uL — ABNORMAL LOW (ref 4.22–5.81)
RDW: 14.4 % (ref 11.5–15.5)
WBC: 7 10*3/uL (ref 4.0–10.5)

## 2016-09-07 LAB — COMPREHENSIVE METABOLIC PANEL
ALBUMIN: 2.3 g/dL — AB (ref 3.5–5.0)
ALK PHOS: 36 U/L — AB (ref 38–126)
ALT: 14 U/L — ABNORMAL LOW (ref 17–63)
ANION GAP: 6 (ref 5–15)
AST: 22 U/L (ref 15–41)
BILIRUBIN TOTAL: 0.6 mg/dL (ref 0.3–1.2)
BUN: 31 mg/dL — AB (ref 6–20)
CALCIUM: 8.2 mg/dL — AB (ref 8.9–10.3)
CO2: 24 mmol/L (ref 22–32)
CREATININE: 2.4 mg/dL — AB (ref 0.61–1.24)
Chloride: 109 mmol/L (ref 101–111)
GFR calc Af Amer: 28 mL/min — ABNORMAL LOW (ref >60)
GFR calc non Af Amer: 24 mL/min — ABNORMAL LOW (ref >60)
GLUCOSE: 156 mg/dL — AB (ref 65–99)
Potassium: 4 mmol/L (ref 3.5–5.1)
Sodium: 139 mmol/L (ref 135–145)
TOTAL PROTEIN: 5.6 g/dL — AB (ref 6.5–8.1)

## 2016-09-07 LAB — HEMOGLOBIN A1C
Hgb A1c MFr Bld: 9 % — ABNORMAL HIGH (ref 4.8–5.6)
Mean Plasma Glucose: 212 mg/dL

## 2016-09-07 LAB — GLUCOSE, CAPILLARY
GLUCOSE-CAPILLARY: 150 mg/dL — AB (ref 65–99)
Glucose-Capillary: 171 mg/dL — ABNORMAL HIGH (ref 65–99)
Glucose-Capillary: 259 mg/dL — ABNORMAL HIGH (ref 65–99)

## 2016-09-07 LAB — PROTIME-INR
INR: 1.49
PROTHROMBIN TIME: 18.2 s — AB (ref 11.4–15.2)

## 2016-09-07 MED ORDER — SODIUM CHLORIDE 0.9 % IV SOLN
INTRAVENOUS | Status: DC
  Administered 2016-09-07 (×2): via INTRAVENOUS

## 2016-09-07 MED ORDER — ENOXAPARIN SODIUM 30 MG/0.3ML ~~LOC~~ SOLN
30.0000 mg | SUBCUTANEOUS | Status: DC
Start: 2016-09-07 — End: 2016-09-09
  Administered 2016-09-07 – 2016-09-08 (×2): 30 mg via SUBCUTANEOUS
  Filled 2016-09-07 (×2): qty 0.3

## 2016-09-07 MED ORDER — WARFARIN SODIUM 5 MG PO TABS
5.0000 mg | ORAL_TABLET | Freq: Once | ORAL | Status: AC
  Administered 2016-09-07: 5 mg via ORAL
  Filled 2016-09-07: qty 1

## 2016-09-07 MED ORDER — INSULIN ASPART 100 UNIT/ML ~~LOC~~ SOLN
0.0000 [IU] | Freq: Three times a day (TID) | SUBCUTANEOUS | Status: DC
  Administered 2016-09-08: 2 [IU] via SUBCUTANEOUS
  Administered 2016-09-08: 3 [IU] via SUBCUTANEOUS
  Administered 2016-09-09: 2 [IU] via SUBCUTANEOUS

## 2016-09-07 MED ORDER — INSULIN ASPART 100 UNIT/ML ~~LOC~~ SOLN
0.0000 [IU] | Freq: Every day | SUBCUTANEOUS | Status: DC
  Administered 2016-09-07 – 2016-09-08 (×2): 2 [IU] via SUBCUTANEOUS

## 2016-09-07 NOTE — Progress Notes (Addendum)
[facility] for Coumadin Indication: atrial fibrillation  Allergies  Allergen Reactions  . Ace Inhibitors Other (See Comments)    Renal failure    Patient Measurements: Height: 5\' 10"  (177.8 cm) Weight: 181 lb 4.8 oz (82.2 kg) IBW/kg (Calculated) : 73  Vital Signs: Temp: 98 F (36.7 C) (01/14 0555) Temp Source: Oral (01/14 0555) BP: 159/71 (01/14 1037) Pulse Rate: 67 (01/14 0555)  Labs:  Recent Labs  09/06/16 1157 09/06/16 1918 09/07/16 0647  HGB 12.8*  --  10.9*  HCT 39.8  --  34.4*  PLT 277  --  204  APTT  --  42*  --   LABPROT 16.6*  --  18.2*  INR 1.33  --  1.49  CREATININE 2.95*  --  2.40*    Estimated Creatinine Clearance: 26.6 mL/min (by C-G formula based on SCr of 2.4 mg/dL (H)).   Medical History: Past Medical History:  Diagnosis Date  . CAD   . Cancer ([place])    skin  . CEREBROVASCULAR ACCIDENT   . CEREBROVASCULAR DISEASE   . DIABETES MELLITUS   . HYPERLIPIDEMIA   . HYPERTENSION   . MYOCARDIAL INFARCTION    x 2  . RENAL INSUFFICIENCY   . SUPRAVENTRICULAR TACHYCARDIA     Assessment: 76 year old male on Coumadin PTA for afib, INR on admission = 1.33 INR today = 1.49 Dose PTA = 2.5 mg po daily x 5 mg on Saturdays (missed doses PTA)  Started on Levofloxacin this admission for pneumonia  Goal of Therapy:  INR 2-3 Monitor platelets by anticoagulation protocol: Yes   Plan:  Coumadin 5 mg po x 1 dose at 1800 pm Daily INR  Thank you Anette Guarneri, PharmD [phone]  09/07/2016,11:46 AM

## 2016-09-07 NOTE — Evaluation (Signed)
Physical Therapy Evaluation Patient Details Name: [name] [name] MRN: [id number] DOB: 18-Feb-1941 Today's Date: 09/07/2016   History of Present Illness  76 y.o. male with medical history significant of CAD, paroxysmal A. Fib on Coumadin anticoagulation therapy, CVA with residual right-sided weakness, hyperlipidemia, hypertension, diabetes mellitus who presented to the emergency department with complaints increasing weakness and cough with thick phlegm  Clinical Impression  Patient demonstrates deficits in functional mobility as indicated below. Will need continued skilled PT to address deficits and maximize function. Will see as indicated and progress as tolerated. Recommend HHPT upon acute discharge and 24 hour assist from family. If unable to provide may need to consider ST SNF.    Follow Up Recommendations Home health PT;Supervision/Assistance - 24 hour (if family unable to assist, may consider SNF)    Equipment Recommendations  None recommended by PT    Recommendations for Other Services       Precautions / Restrictions Precautions Precautions: Fall Precaution Comments: Droplet precautions at this time Restrictions Weight Bearing Restrictions: No      Mobility  Bed Mobility Overal bed mobility: Needs Assistance Bed Mobility: Supine to Sit     Supine to sit: Min assist     General bed mobility comments: min assist to power trunk up to upright position at EOB. Increased effort and time noted  Transfers Overall transfer level: Needs assistance Equipment used: Rolling walker (2 wheeled) Transfers: Sit to/from [facility] Sit to Stand: Min assist Stand pivot transfers: Min assist       General transfer comment: Min assist to power up to standing position with RW. Cues for hand placement and safety. upon elevation to upright, patient with incontinence of urine. Min assist to pivot to [facility]. Cues for hand placement upon standing from commode as  well  Ambulation/Gait Ambulation/Gait assistance: Min assist Ambulation Distance (Feet): 20 Feet Assistive device: Rolling walker (2 wheeled) Gait Pattern/deviations: Step-through pattern;Decreased stride length;Shuffle;Narrow base of support;Trunk flexed Gait velocity: decreased Gait velocity interpretation: Below normal speed for age/gender General Gait Details: slow gaurded gait, some instability noted, limited by fatigue  Stairs            Wheelchair Mobility    Modified Rankin (Stroke Patients Only)       Balance Overall balance assessment: Needs assistance Sitting-balance support: Feet supported Sitting balance-Leahy Scale: Fair     Standing balance support: During functional activity Standing balance-Leahy Scale: Fair Standing balance comment: reliance on RW for static stability                             Pertinent Vitals/Pain Pain Assessment: Faces Faces Pain Scale: Hurts little more Pain Location: buttocks Pain Descriptors / Indicators: Sore Pain Intervention(s): Monitored during session    Home Living Family/patient expects to be discharged to:: Private residence Living Arrangements: Spouse/significant other Available Help at Discharge: Family;Available PRN/intermittently Type of Home: House Home Access: Stairs to enter Entrance Stairs-Rails: Right Entrance Stairs-Number of Steps: 4 Home Layout: One level Home Equipment: Walker - 2 wheels      Prior Function Level of Independence: Independent with assistive device(s)         Comments: uses RW for mobility at home     Hand Dominance   Dominant Hand: Right    Extremity/Trunk Assessment   Upper Extremity Assessment Upper Extremity Assessment: Generalized weakness    Lower Extremity Assessment Lower Extremity Assessment: Generalized weakness       Communication  Cognition Arousal/Alertness: Awake/alert Behavior During Therapy: WFL for tasks  assessed/performed Overall Cognitive Status: No family/caregiver present to determine baseline cognitive functioning                      General Comments      Exercises     Assessment/Plan    PT Assessment Patient needs continued PT services  PT Problem List Decreased strength;Decreased activity tolerance;Decreased balance;Decreased mobility;Decreased knowledge of use of DME;Decreased safety awareness;Pain          PT Treatment Interventions DME instruction;Gait training;Stair training;Functional mobility training;Therapeutic activities;Therapeutic exercise;Balance training;Neuromuscular re-education;Cognitive remediation;Patient/family education    PT Goals (Current goals can be found in the Care Plan section)  Acute Rehab PT Goals Patient Stated Goal: to go home PT Goal Formulation: With patient Time For Goal Achievement: 09/21/16 Potential to Achieve Goals: Good    Frequency Min 3X/week   Barriers to discharge        Co-evaluation               End of Session Equipment Utilized During Treatment: Gait belt Activity Tolerance: Patient limited by fatigue Patient left: in chair;with call bell/phone within reach;with chair alarm set Nurse Communication: Mobility status    Functional Assessment Tool Used: clinical judgement Functional Limitation: Mobility: Walking and moving around Mobility: Walking and Moving Around Current Status [phone]): At least 20 percent but less than 40 percent impaired, limited or restricted Mobility: Walking and Moving Around Goal Status [phone]): At least 1 percent but less than 20 percent impaired, limited or restricted    Time: 1135-1156 PT Time Calculation (min) (ACUTE ONLY): 21 min   Charges:   PT Evaluation $PT Eval Moderate Complexity: 1 Procedure     PT G Codes:   PT G-Codes **NOT FOR INPATIENT CLASS** Functional Assessment Tool Used: clinical judgement Functional Limitation: Mobility: Walking and moving  around Mobility: Walking and Moving Around Current Status [id number]): At least 20 percent but less than 40 percent impaired, limited or restricted Mobility: Walking and Moving Around Goal Status [phone]): At least 1 percent but less than 20 percent impaired, limited or restricted    Duncan Dull 09/07/2016, 12:01 PM  Alben Deeds, [place] DPT  [phone]

## 2016-09-07 NOTE — Progress Notes (Signed)
PROGRESS NOTE  [name] [name]  [id number] DOB: 11-18-1940  DOA: 09/06/2016 PCP: No PCP Per Patient   Brief Narrative:  76 year old male with PMH of CAD, PAF on Coumadin, CVA with residual right hemiparesis, HLD, HTN, DM 2 presented to ED with complaints of increasing weakness, productive cough. At baseline, he has right-sided weakness but able to ambulate using a walker. However for several hours prior to ED visit, he became progressively weak and unable to even ambulate with a walker and having difficulties getting out of bed or chair. Low-grade fevers at home and mild myalgia. In the ED, temperature 100.9, respiratory rate 29, WBC 11.9, creatinine 2.9, lactate 2.9 and chest x-ray showed no acute findings. Admitted for sepsis due to acute viral bronchitis   Assessment & Plan:   Principal Problem:   Sepsis ([place]) Active Problems:   Coronary atherosclerosis   Diabetes mellitus ([place])   Essential hypertension   HLD (hyperlipidemia)   Paroxysmal atrial fibrillation (H[facility])   Acute bronchitis   1. Sepsis secondary to acute bronchitis: Met sepsis criteria on admission. Treated for sepsis protocol. Improved. 2. Acute bronchitis: Likely viral but cannot rule out bacterial secondary infection. Chest x-ray 2 on 09/06/16: No acute disease. Influenza A & B PCR negative. Blood cultures 2: Negative to date. Urine culture: Greater than 100 K [place] is negative staph? Significance. Urine microscopy had no bacteria. Pro-calcitonin 0.19. Lactate has normalized. Treat supportively and complete course of levofloxacin. No clinical bronchospasm. 3. Essential hypertension: Fluctuating and mildly uncontrolled. Continue amlodipine and metoprolol. 4. Paroxysmal A. fib: Sinus rhythm on telemetry. Continue metoprolol and Coumadin per pharmacy. INR 1.9. 5. Type II DM: A1c 9 suggesting poor outpatient control. Continue Lantus 30 units at bedtime and SSI. Mildly uncontrolled in the hospital. At bedtime SSI. Holding  Glucotrol. 6. Stage IV chronic kidney disease: Presented with creatinine of 2.95. Improved to 2.4. Follow BMP with gentle hydration. Not clinically volume overloaded. 7. CVA: No significant residual right hemiparesis. PT evaluation pending. 8. HLD: Statins 9. Anemia: Follow CBCs   DVT prophylaxis: Lovenox while INR is subtherapeutic <2. Coumadin per pharmacy Code Status: Full Family Communication: None at bedside Disposition Plan: DC home when medically stable   Consultants:   None  Procedures:   None  Antimicrobials:   Levofloxacin    Subjective: States that he feels better. Feels stronger. Denies fever or pains. Chest feels congested but unable to bring up sputum. No dyspnea reported.  Objective:  Vitals:   09/07/16 0244 09/07/16 0555 09/07/16 1037 09/07/16 1548  BP:  (!) 133/47 (!) 159/71 (!) 145/65  Pulse:  67  81  Resp:  18  (!) 24  Temp:  98 F (36.7 C)  98.2 F (36.8 C)  TempSrc:  Oral  Oral  SpO2:    93%  Weight: 82.2 kg (181 lb 4.8 oz)     Height:        Intake/Output Summary (Last 24 hours) at 09/07/16 1650 Last data filed at 09/07/16 1445  Gross per 24 hour  Intake           923.75 ml  Output              345 ml  Net           578.75 ml   Filed Weights   09/06/16 1118 09/06/16 1705 09/07/16 0244  Weight: 81.6 kg (180 lb) 81.3 kg (179 lb 3.2 oz) 82.2 kg (181 lb 4.8 oz)    Examination:  General exam: Pleasant  elderly male lying comfortably supine in bed. Respiratory system: Harsh breath sounds bilaterally but without wheezing, rhonchi or crackles. Respiratory effort normal. Cardiovascular system: S1 & S2 heard, RRR. No JVD, murmurs, rubs, gallops or clicks. No pedal edema. Telemetry: Sinus rhythm. Gastrointestinal system: Abdomen is nondistended, soft and nontender. No organomegaly or masses felt. Normal bowel sounds heard. Central nervous system: Alert and oriented. No focal neurological deficits. Extremities: Symmetric 5 x 5 power. Skin: No  rashes, lesions or ulcers Psychiatry: Judgement and insight appear normal. Mood & affect appropriate.     Data Reviewed: I have personally reviewed following labs and imaging studies  CBC:  Recent Labs Lab 09/06/16 1157 09/07/16 0647  WBC 11.9* 7.0  NEUTROABS 7.8*  --   HGB 12.8* 10.9*  HCT 39.8 34.4*  MCV 86.5 86.4  PLT 277 494   Basic Metabolic Panel:  Recent Labs Lab 09/06/16 1157 09/07/16 0647  NA 137 139  K 4.2 4.0  CL 103 109  CO2 23 24  GLUCOSE 230* 156*  BUN 33* 31*  CREATININE 2.95* 2.40*  CALCIUM 9.3 8.2*   GFR: Estimated Creatinine Clearance: 26.6 mL/min (by C-G formula based on SCr of 2.4 mg/dL (H)). Liver Function Tests:  Recent Labs Lab 09/06/16 1157 09/07/16 0647  AST 25 22  ALT 19 14*  ALKPHOS 41 36*  BILITOT 1.1 0.6  PROT 7.1 5.6*  ALBUMIN 3.2* 2.3*   No results for input(s): LIPASE, AMYLASE in the last 168 hours. No results for input(s): AMMONIA in the last 168 hours. Coagulation Profile:  Recent Labs Lab 09/06/16 1157 09/07/16 0647  INR 1.33 1.49   Cardiac Enzymes: No results for input(s): CKTOTAL, CKMB, CKMBINDEX, TROPONINI in the last 168 hours. BNP (last 3 results) No results for input(s): PROBNP in the last 8760 hours. HbA1C:  Recent Labs  09/06/16 1215  HGBA1C 9.0*   CBG:  Recent Labs Lab 09/06/16 1712 09/06/16 2116 09/07/16 0753 09/07/16 1137  GLUCAP 259* 189* 150* 171*   Lipid Profile: No results for input(s): CHOL, HDL, LDLCALC, TRIG, CHOLHDL, LDLDIRECT in the last 72 hours. Thyroid Function Tests: No results for input(s): TSH, T4TOTAL, FREET4, T3FREE, THYROIDAB in the last 72 hours. Anemia Panel: No results for input(s): VITAMINB12, FOLATE, FERRITIN, TIBC, IRON, RETICCTPCT in the last 72 hours.  Sepsis Labs:  Recent Labs Lab 09/06/16 1157 09/06/16 1249 09/06/16 1539 09/06/16 1611 09/07/16 0647  PROCALCITON  --   --  0.19  --   --   WBC 11.9*  --   --   --  7.0  LATICACIDVEN  --  2.91*  --   0.91  --     Recent Results (from the past 240 hour(s))  Culture, blood (Routine X 2) w Reflex to ID Panel     Status: None (Preliminary result)   Collection Time: 09/06/16 12:38 PM  Result Value Ref Range Status   Specimen Description BLOOD LEFT FOREARM  Final   Special Requests BOTTLES DRAWN AEROBIC AND ANAEROBIC  Final   Culture NO GROWTH < 24 HOURS  Final   Report Status PENDING  Incomplete  Culture, blood (Routine X 2) w Reflex to ID Panel     Status: None (Preliminary result)   Collection Time: 09/06/16 12:38 PM  Result Value Ref Range Status   Specimen Description BLOOD RIGHT FOREARM  Final   Special Requests NONE  Final   Culture NO GROWTH < 24 HOURS  Final   Report Status PENDING  Incomplete  Urine culture  Status: Abnormal (Preliminary result)   Collection Time: 09/06/16  1:03 PM  Result Value Ref Range Status   Specimen Description URINE, RANDOM  Final   Special Requests NONE  Final   Culture (A)  Final    >=100,000 COLONIES/mL STAPHYLOCOCCUS SPECIES (COAGULASE NEGATIVE) SUSCEPTIBILITIES TO FOLLOW    Report Status PENDING  Incomplete         Radiology Studies: Dg Chest 2 View  Result Date: 09/06/2016 CLINICAL DATA:  Pt from home with fever, weakness, and fatigue. Pt states his symptoms began 3 days ago. Wife with same illness. Hx HTN, CAD, MI x2 EXAM: CHEST  2 VIEW COMPARISON:  11/25/2007 FINDINGS: The heart size and mediastinal contours are within normal limits. Both lungs are clear. No pleural effusion or pneumothorax. Skeletal structures are intact. IMPRESSION: No active cardiopulmonary disease. Electronically Signed   By: Lajean Manes M.D.   On: 09/06/2016 12:32   Dg Chest Port 1 View  Result Date: 09/06/2016 CLINICAL DATA:  Sepsis. EXAM: PORTABLE CHEST 1 VIEW COMPARISON:  July 07, 2017 FINDINGS: The heart size and mediastinal contours are within normal limits. Both lungs are clear. The visualized skeletal structures are unremarkable. IMPRESSION: No  active disease. Electronically Signed   By: Dorise Bullion III M.D   On: 09/06/2016 18:22        Scheduled Meds: . amLODipine  5 mg Oral Daily  . aspirin EC  81 mg Oral Daily  . fenofibrate  160 mg Oral Daily  . guaiFENesin  600 mg Oral BID  . insulin aspart  0-9 Units Subcutaneous TID WC  . insulin glargine  30 Units Subcutaneous QHS  . levofloxacin (LEVAQUIN) IV  500 mg Intravenous Q48H  . metoprolol succinate  50 mg Oral Daily  . pravastatin  80 mg Oral Daily  . sodium chloride flush  3 mL Intravenous Q12H  . vitamin B-12  100 mcg Oral Daily  . warfarin  5 mg Oral ONCE-1800  . Warfarin - Pharmacist Dosing Inpatient   Does not apply q1800   Continuous Infusions: . sodium chloride 75 mL/hr at 09/07/16 0803     LOS: 0 days     [facility] [place], MD [facility] Pager [phone]  If 7PM-7AM, please contact night-coverage [email or web address] Password [facility] 09/07/2016, 4:50 PM

## 2016-09-07 NOTE — Care Management Obs Status (Signed)
[place] NOTIFICATION   Patient Details  Name: [name] [name] MRN: [id number] Date of Birth: 02/27/1941   Medicare Observation Status Notification Given:  Yes    Dawayne Patricia, RN 09/07/2016, 1:26 PM

## 2016-09-08 DIAGNOSIS — A419 Sepsis, unspecified organism: Secondary | ICD-10-CM | POA: Diagnosis not present

## 2016-09-08 DIAGNOSIS — E785 Hyperlipidemia, unspecified: Secondary | ICD-10-CM | POA: Diagnosis not present

## 2016-09-08 DIAGNOSIS — I10 Essential (primary) hypertension: Secondary | ICD-10-CM | POA: Diagnosis not present

## 2016-09-08 DIAGNOSIS — J209 Acute bronchitis, unspecified: Secondary | ICD-10-CM | POA: Diagnosis not present

## 2016-09-08 DIAGNOSIS — I2510 Atherosclerotic heart disease of native coronary artery without angina pectoris: Secondary | ICD-10-CM | POA: Diagnosis not present

## 2016-09-08 DIAGNOSIS — I129 Hypertensive chronic kidney disease with stage 1 through stage 4 chronic kidney disease, or unspecified chronic kidney disease: Secondary | ICD-10-CM | POA: Diagnosis not present

## 2016-09-08 LAB — CBC
HCT: 35.9 % — ABNORMAL LOW (ref 39.0–52.0)
Hemoglobin: 11.8 g/dL — ABNORMAL LOW (ref 13.0–17.0)
MCH: 27.9 pg (ref 26.0–34.0)
MCHC: 32.9 g/dL (ref 30.0–36.0)
MCV: 84.9 fL (ref 78.0–100.0)
PLATELETS: 251 10*3/uL (ref 150–400)
RBC: 4.23 MIL/uL (ref 4.22–5.81)
RDW: 14.2 % (ref 11.5–15.5)
WBC: 10.5 10*3/uL (ref 4.0–10.5)

## 2016-09-08 LAB — BASIC METABOLIC PANEL
ANION GAP: 7 (ref 5–15)
BUN: 33 mg/dL — AB (ref 6–20)
CALCIUM: 9.1 mg/dL (ref 8.9–10.3)
CO2: 23 mmol/L (ref 22–32)
Chloride: 109 mmol/L (ref 101–111)
Creatinine, Ser: 2.54 mg/dL — ABNORMAL HIGH (ref 0.61–1.24)
GFR calc Af Amer: 26 mL/min — ABNORMAL LOW (ref >60)
GFR, EST NON AFRICAN AMERICAN: 23 mL/min — AB (ref >60)
Glucose, Bld: 140 mg/dL — ABNORMAL HIGH (ref 65–99)
POTASSIUM: 4.4 mmol/L (ref 3.5–5.1)
SODIUM: 139 mmol/L (ref 135–145)

## 2016-09-08 LAB — PROTIME-INR
INR: 1.41
PROTHROMBIN TIME: 17.4 s — AB (ref 11.4–15.2)

## 2016-09-08 LAB — GLUCOSE, CAPILLARY
GLUCOSE-CAPILLARY: 184 mg/dL — AB (ref 65–99)
GLUCOSE-CAPILLARY: 243 mg/dL — AB (ref 65–99)
Glucose-Capillary: 120 mg/dL — ABNORMAL HIGH (ref 65–99)
Glucose-Capillary: 234 mg/dL — ABNORMAL HIGH (ref 65–99)

## 2016-09-08 LAB — PROCALCITONIN: PROCALCITONIN: 0.5 ng/mL

## 2016-09-08 LAB — URINE CULTURE

## 2016-09-08 MED ORDER — DOXYCYCLINE HYCLATE 100 MG PO TABS: 100.0000 mg | ORAL_TABLET | Freq: Two times a day (BID) | ORAL | Status: DC

## 2016-09-08 MED ORDER — DOXYCYCLINE HYCLATE 100 MG PO TABS
100.0000 mg | ORAL_TABLET | Freq: Two times a day (BID) | ORAL | Status: DC
  Administered 2016-09-08 – 2016-09-09 (×3): 100 mg via ORAL
  Filled 2016-09-08 (×3): qty 1

## 2016-09-08 MED ORDER — INSULIN ASPART 100 UNIT/ML ~~LOC~~ SOLN: 0.0000 [IU] | Freq: Three times a day (TID) | SUBCUTANEOUS | Status: DC

## 2016-09-08 MED ORDER — WARFARIN SODIUM 5 MG PO TABS: 5.0000 mg | ORAL_TABLET | Freq: Every day | ORAL | Status: DC

## 2016-09-08 MED ORDER — LORAZEPAM 1 MG PO TABS
1.0000 mg | ORAL_TABLET | Freq: Once | ORAL | Status: AC
  Administered 2016-09-08: 1 mg via ORAL
  Filled 2016-09-08: qty 1

## 2016-09-08 MED ORDER — WARFARIN SODIUM 5 MG PO TABS
5.0000 mg | ORAL_TABLET | Freq: Once | ORAL | Status: AC
  Administered 2016-09-08: 5 mg via ORAL
  Filled 2016-09-08: qty 1

## 2016-09-08 NOTE — Clinical Social Work Placement (Addendum)
   CLINICAL SOCIAL WORK PLACEMENT  NOTE  Date:  09/08/2016  Patient Details  Name: [name] [name] MRN: [id number] Date of Birth: Feb 28, 1941  Clinical Social Work is seeking post-discharge placement for this patient at the [place] level of care (*CSW will initial, date and re-position this form in  chart as items are completed):  Yes   Patient/family provided with [place] Work Department's list of facilities offering this level of care within the geographic area requested by the patient (or if unable, by the patient's family).  Yes   Patient/family informed of their freedom to choose among providers that offer the needed level of care, that participate in Medicare, Medicaid or managed care program needed by the patient, have an available bed and are willing to accept the patient.  Yes   Patient/family informed of [place]'s ownership interest in [facility] and [facility], as well as of the fact that they are under no obligation to receive care at these facilities.  PASRR submitted to EDS on       PASRR number received on       Existing PASRR number confirmed on 09/08/16     FL2 transmitted to all facilities in geographic area requested by pt/family on 09/08/16     FL2 transmitted to all facilities within larger geographic area on       Patient informed that his/her managed care company has contracts with or will negotiate with certain facilities, including the following:        Yes   Patient/family informed of bed offers received.  [name]     Physician recommends and patient chooses bed at      Patient to be transferred to [facility] on 09/09/16.  Patient to be transferred to facility by ptar     Patient family notified on 09/09/16 of transfer.  Name of family member notified:  [name]     PHYSICIAN Please sign FL2     Additional  Comment:    _______________________________________________ Jorge Ny, LCSW 09/08/2016, 3:20 PM

## 2016-09-08 NOTE — Progress Notes (Addendum)
Patient will discharge to [facility] Anticipated discharge date:1/15 Family notified: pt wife, Laurie Panda by PTAR-scheduled for 2:15pm Report #: [phone]  [place] signing off.  Jorge Ny, LCSW Clinical Social Worker [phone]

## 2016-09-08 NOTE — NC FL2 (Signed)
[place] MEDICAID FL2 LEVEL OF CARE SCREENING TOOL     IDENTIFICATION  Patient Name: [name] [name] Birthdate: [birth year] Sex: male Admission Date (Current Location): 09/06/2016  [facility] and [place] Number:  Herbalist and Address:  [facility], [place] [address] [place], [place] [address]      Provider Number: [id number]  Attending Physician Name and Address:  Modena Jansky, MD  Relative Name and Phone Number:       Current Level of Care: Hospital Recommended Level of Care: [place] Prior Approval Number:    Date Approved/Denied:   PASRR Number: [id number]  Discharge Plan: SNF    Current Diagnoses: Patient Active Problem List   Diagnosis Date Noted  . Acute bronchitis 09/06/2016  . Sepsis ([place]) 09/06/2016  . Long-term (current) use of anticoagulants 05/10/2015  . Tobacco abuse 05/07/2015  . Cerebral infarction due to embolism of left middle cerebral artery ([place]) 12/15/2014  . Paroxysmal atrial fibrillation ([place]) 12/15/2014  . CKD (chronic kidney disease) 12/15/2014  . Hyperlipidemia 12/15/2014  . Type 2 diabetes mellitus with other circulatory complications [id number]  . B12 deficiency anemia 12/10/2014  . Right hemiparesis ([place]) 10/26/2014  . Diabetes mellitus ([place]) 10/26/2014  . Essential hypertension   . HLD (hyperlipidemia)   . Stroke ([place]) 10/25/2014  . Cerebrovascular disease 12/31/2009  . MURMUR 12/12/2008  . MYOCARDIAL INFARCTION 12/11/2008  . ANGINA, UNSTABLE 12/11/2008  . Coronary atherosclerosis 12/11/2008  . SUPRAVENTRICULAR TACHYCARDIA 12/11/2008  . Cerebral artery occlusion with cerebral infarction ([place]) 12/11/2008  . Disorder resulting from impaired renal function 12/11/2008  . CAROTID BRUIT 12/11/2008  . HYPERGLYCEMIA 12/11/2008    Orientation RESPIRATION BLADDER Height & Weight     Self, Time, Place  Normal Continent Weight: 181 lb 4.8 oz (82.2 kg) Height:  5\' 10"  (177.8 cm)   BEHAVIORAL SYMPTOMS/MOOD NEUROLOGICAL BOWEL NUTRITION STATUS      Continent Diet  AMBULATORY STATUS COMMUNICATION OF NEEDS Skin   Extensive Assist Verbally Normal                       Personal Care Assistance Level of Assistance  Bathing, Dressing Bathing Assistance: Limited assistance   Dressing Assistance: Limited assistance     Functional Limitations Info             SPECIAL CARE FACTORS FREQUENCY  PT (By licensed PT), OT (By licensed OT)     PT Frequency: 5/wk OT Frequency: 5/wk            Contractures      Additional Factors Info  Code Status, Allergies   Allergies Info: Ace Inhibitors           Current Medications (09/08/2016):  This is the current hospital active medication list Current Facility-Administered Medications  Medication Dose Route Frequency Provider Last Rate Last Dose  . acetaminophen (TYLENOL) tablet 650 mg  650 mg Oral Q6H PRN Brenton Grills, PA-C       Or  . acetaminophen (TYLENOL) suppository 650 mg  650 mg Rectal Q6H PRN Brenton Grills, PA-C      . amLODipine (NORVASC) tablet 5 mg  5 mg Oral Daily Brenton Grills, PA-C   5 mg at 09/08/16 0956  . aspirin EC tablet 81 mg  81 mg Oral Daily Brenton Grills, PA-C   81 mg at 09/08/16 0956  . enoxaparin (LOVENOX) injection 30 mg  30 mg Subcutaneous Q24H Modena Jansky, MD  30 mg at 09/07/16 2128  . fenofibrate tablet 160 mg  160 mg Oral Daily Brenton Grills, PA-C   160 mg at 09/08/16 [id number]  . guaiFENesin (MUCINEX) 12 hr tablet 600 mg  600 mg Oral BID Brenton Grills, PA-C   600 mg at 09/08/16 [id number]  . insulin aspart (novoLOG) injection 0-5 Units  0-5 Units Subcutaneous QHS Modena Jansky, MD   2 Units at 09/07/16 2129  . insulin aspart (novoLOG) injection 0-9 Units  0-9 Units Subcutaneous TID WC Modena Jansky, MD      . insulin glargine (LANTUS) injection 30 Units  30 Units Subcutaneous QHS Brenton Grills, [place]   30 Units at 09/07/16 2129  .  ipratropium-albuterol (DUONEB) 0.5-2.5 (3) MG/3ML nebulizer solution 3 mL  3 mL Nebulization Q2H PRN Brenton Grills, PA-C      . levofloxacin (LEVAQUIN) IVPB 500 mg  500 mg Intravenous Q48H Brenton Grills, PA-C   500 mg at 09/06/16 1848  . metoprolol succinate (TOPROL-XL) 24 hr tablet 50 mg  50 mg Oral Daily Brenton Grills, PA-C   50 mg at 09/08/16 0956  . ondansetron (ZOFRAN) tablet 4 mg  4 mg Oral Q6H PRN Brenton Grills, PA-C       Or  . ondansetron [facility]) injection 4 mg  4 mg Intravenous Q6H PRN Brenton Grills, PA-C      . polyethylene glycol (MIRALAX / GLYCOLAX) packet 17 g  17 g Oral Daily PRN Brenton Grills, PA-C      . pravastatin (PRAVACHOL) tablet 80 mg  80 mg Oral Daily Brenton Grills, PA-C   80 mg at 09/08/16 0956  . sodium chloride flush (NS) 0.9 % injection 3 mL  3 mL Intravenous Q12H Brenton Grills, PA-C   3 mL at 09/08/16 1002  . traZODone (DESYREL) tablet 25 mg  25 mg Oral QHS PRN Brenton Grills, PA-C      . vitamin B-12 (CYANOCOBALAMIN) tablet 100 mcg  100 mcg Oral Daily Brenton Grills, PA-C   100 mcg at 09/08/16 0956  . Warfarin - Pharmacist Dosing Inpatient   Does not apply q1800 Carmin Muskrat, MD         Discharge Medications: Please see discharge summary for a list of discharge medications.  Relevant Imaging Results:  Relevant Lab Results:   Additional Information SS#: [id number]  Jorge Ny, LCSW

## 2016-09-08 NOTE — Clinical Social Work Note (Signed)
Clinical Social Work Assessment  Patient Details  Name: [name] [name] MRN: [id number] Date of Birth: 23-Aug-1941  Date of referral:  09/08/16               Reason for consult:  Facility Placement                Permission sought to share information with:  Family Supports, Chartered certified accountant granted to share information::  Yes, Verbal Permission Granted  Name::     Acupuncturist::  SNF  Relationship::  wife  Contact Information:     Housing/Transportation Living arrangements for the past 2 months:  Single Family Home Source of Information:  Patient, Spouse Patient Interpreter Needed:  None Criminal Activity/Legal Involvement Pertinent to Current Situation/Hospitalization:  No - Comment as needed Significant Relationships:  Adult Children, Spouse Lives with:  Spouse Do you feel safe going back to the place where you live?  No Need for family participation in patient care:  Yes (Comment) (help with decision making at this time)  Care giving concerns:  Pt lives at home with wife- wife works long and inconsistent hours and no one else able to come to the house consistently to monitor patient.   Social Worker assessment / plan:  CSW met with patient and patient spouse at bedside concerning plan for time of DC.  Discussed PT recommendation for SNF if someone couldn't be with patient 24 hours a day.  Pt presented fairly alert and oriented but continued to discuss hallucinations.  Wife at bedside expressed concerns with patient coming home and being alone for multiple hours a day.  Employment status:  Retired Nurse, adult PT Recommendations:  [place] / Referral to community resources:  [place]  Patient/Family's Response to care:  Pt originally against SNF and felt like he could return home safely.  CSW discussed allowing PT to see patient again today and making decision from there.  After PT  patient became agreeable to SNF stay for short term rehab while he improves with mobility.  Wife agreeable to this plan as well and hopeful for placement in [facility] where pt had been in the past.  Patient/Family's Understanding of and Emotional Response to Diagnosis, Current Treatment, and Prognosis:  Wife concerned with patient current mental status- hopeful he'll improve.  Emotional Assessment Appearance:  Appears stated age Attitude/Demeanor/Rapport:  Inconsistent Affect (typically observed):  Pleasant Orientation:  Oriented to Self, Oriented to Place, Oriented to  Time Alcohol / Substance use:  Not Applicable Psych involvement (Current and /or in the community):  No (Comment)  Discharge Needs  Concerns to be addressed:  Care Coordination Readmission within the last 30 days:  No Current discharge risk:  Physical Impairment Barriers to Discharge:  Insurance Authorization   Jorge Ny, LCSW 09/08/2016, 1:40 PM

## 2016-09-08 NOTE — Progress Notes (Signed)
Addendum  Discharge paperwork to SNF had been completed on 1/15. While awaiting transport to pick him up, patient progressively became more confused, starting to get agitated, increasing hallucinations. Discussed with son at bedside. Although he answers orientation questions appropriately, he says "that is up and playing in the room".  Acute confusion/delirium: May be secondary to acute illness, disturbance of sleep rhythm, hospitalization, complicating underlying vascular dementia. Due to new worsening confusion, cancel discharge for today. Ideally would like to use Haldol but EKG shows prolonged QTC 487 ms. Trial of Ativan 1 mg by mouth 1. Monitor closely.  Vernell Leep, MD, FACP, FHM. [facility] Pager [phone]  If 7PM-7AM, please contact night-coverage [email or web address] Password [facility] 09/08/2016, 4:56 PM

## 2016-09-08 NOTE — Progress Notes (Signed)
Tx performed by SPTA under direct guidance and supervision of PTA at all times.  Charting reviewed for accuracy and depicts tx performed and services provided.  Governor Rooks, PTA pager [phone]   Physical Therapy Treatment Patient Details Name: [name] [name] MRN: [id number] DOB: [birth year] Today's Date: 09/08/2016    History of Present Illness 76 y.o. male with medical history significant of CAD, paroxysmal A. Fib on Coumadin anticoagulation therapy, CVA with residual right-sided weakness, hyperlipidemia, hypertension, diabetes mellitus who presented to the emergency department with complaints increasing weakness and cough with thick phlegm    PT Comments    Upon entry, pt was laying in soiled bed and stated that no one will give him briefs to wear. He was having hallucinations that there were spiders in his bed trying to get him. Family told SPTA he had previous hallucinations of women in his bed that he had his arms around.  Pt is requiring increased assistance due to change in cognition, inability to ambulate and navigate safely. Change in recommendation for SNF/ 24 hour supervision/ assistance.   Follow Up Recommendations  SNF;Supervision/Assistance - 24 hour     Equipment Recommendations  None recommended by PT    Recommendations for Other Services OT consult     Precautions / Restrictions Precautions Precautions: Fall Precaution Comments: Droplet precautions at this time due to fever; neg for influenza Restrictions Weight Bearing Restrictions: No    Mobility  Bed Mobility Overal bed mobility: Needs Assistance Bed Mobility: Supine to Sit     Supine to sit: Min assist     General bed mobility comments: min A + rail to elevated trunk out of bed into upright postition at EOB.  Increased effort and time required.   Transfers Overall transfer level: Needs assistance Equipment used: Rolling walker (2 wheeled) Transfers: Sit to/from Stand Sit to Stand: Mod assist          General transfer comment: Mod assist to power up to standing position with RW due to patient frustration of feeling stuck and generalized weakness. Mod A + v/c for hand placement on chair upon elevation. Mod A + v/c for hand placement upon standing from commode for pt to perform self-hygiene.  Ambulation/Gait Ambulation/Gait assistance: Min assist Ambulation Distance (Feet): 40 Feet (+ 30, + 15) Assistive device: Rolling walker (2 wheeled) Gait Pattern/deviations: Step-through pattern;Decreased stride length;Shuffle;Narrow base of support;Trunk flexed Gait velocity: decreased, poor navigation with turns and obsticle negotiation.  Gait velocity interpretation: Below normal speed for age/gender General Gait Details: instability noted during gait; pt presented with increased work of breathing and required multiple resk breaks to reduce HR.    Stairs            Wheelchair Mobility    Modified Rankin (Stroke Patients Only)       Balance Overall balance assessment: Needs assistance Sitting-balance support: Feet supported Sitting balance-Leahy Scale: Fair Sitting balance - Comments: required multiple cues for hand placement and scooting to get to EOB.   Standing balance support: During functional activity;Bilateral upper extremity supported Standing balance-Leahy Scale: Fair Standing balance comment: reliance on RW for stability during gait and unilateral UE support and SPTA +1 HHA during hand washing.                     Cognition Arousal/Alertness: Awake/alert Behavior During Therapy: WFL for tasks assessed/performed Overall Cognitive Status: Impaired/Different from baseline Area of Impairment: Safety/judgement;Orientation         Safety/Judgement: Decreased awareness of deficits;Decreased awareness  of safety     General Comments: pt and family stating that he is having hallucinations of women and spiders in his bed. Also presented with divided attention  during conversation.     Exercises      General Comments        Pertinent Vitals/Pain Pain Assessment: No/denies pain    Home Living                      Prior Function            PT Goals (current goals can now be found in the care plan section) Acute Rehab PT Goals Patient Stated Goal: family stated they are unable to take care of pt at home and would like to see him go to rehab.  PT Goal Formulation: With family Potential to Achieve Goals: Good Progress towards PT goals: Progressing toward goals    Frequency    Min 3X/week      PT Plan Discharge plan needs to be updated    Co-evaluation             End of Session Equipment Utilized During Treatment: Gait belt Activity Tolerance: Patient limited by fatigue;Patient tolerated treatment well (displayed increased work of breathing that required multiple rest breaks) Patient left: in chair;with call bell/phone within reach;with chair alarm set;with family/visitor present     Time: 1130-1201 PT Time Calculation (min) (ACUTE ONLY): 31 min  Charges:  $Gait Training: 8-22 mins $Therapeutic Activity: 8-22 mins                    G Codes:      [facility] 21-Sep-2016, 12:27 PM  Olena [place], [place] Pager [phone]

## 2016-09-08 NOTE — Progress Notes (Signed)
ANTICOAGULATION CONSULT NOTE - Follow Up Consult  Pharmacy Consult for Coumadin Indication: atrial fibrillation and prior CVA  Allergies  Allergen Reactions  . Ace Inhibitors Other (See Comments)    Renal failure    Patient Measurements: Height: 5\' 10"  (177.8 cm) Weight: 181 lb 4.8 oz (82.2 kg) IBW/kg (Calculated) : 73  Vital Signs: Temp: 98.2 F (36.8 C) (01/15 0656) Temp Source: Oral (01/15 0656) BP: 152/64 (01/15 0656) Pulse Rate: 74 (01/15 0656)  Labs:  Recent Labs  09/06/16 1157 09/06/16 1918 09/07/16 0647 09/08/16 0448  HGB 12.8*  --  10.9* 11.8*  HCT 39.8  --  34.4* 35.9*  PLT 277  --  204 251  APTT  --  42*  --   --   LABPROT 16.6*  --  18.2* 17.4*  INR 1.33  --  1.49 1.41  CREATININE 2.95*  --  2.40* 2.54*    Estimated Creatinine Clearance: 25.1 mL/min (by C-G formula based on SCr of 2.54 mg/dL (H)).  Assessment:   76 yr old male on Coumadin prior to admission for hx CVA and PAF.   INR on admit 1.33, subtherapeutic. May have missed some doses prior to admission. INR 1.49 yesterday, 1.41 today, after Coumadin 7.5 mg on 1/14 and 5 mg on 1/15.  Lovenox 30 mg sq 24hrs added 1/14.  Received Levaquin x 1 on 1/13 (2nd dose of q48h regimen was due tonight); changed to Doxycyline PO today.    Home Coumadin regimen: 2.5 mg daily except 5 mg on Saturdays. Outpt INR was therapeutic (2.2) on 08/20/16.    Goal of Therapy:  INR 2-3 Monitor platelets by anticoagulation protocol: Yes   Plan:   Coumadin 5 mg again today.  Daily PT/INR.  Lovenox 30 mg sq q24hrs while INR <2.  Arty Baumgartner, [place] Pager: [phone] 09/08/2016,1:26 PM

## 2016-09-08 NOTE — Discharge Summary (Signed)
Physician Discharge Summary  [name] [name] [id number] DOB: 28-Sep-1940  PCP: No PCP Per Patient  Admit date: 09/06/2016 Discharge date: 09/08/2016  Recommendations for Outpatient Follow-up:  1. M.D. at SNF in 3 days. 2. Recommend daily labs (PT & INR) and adjust Coumadin dose as needed. Prior to hospital admission, patient was on a reduced/different Coumadin regimen (2.5 MG daily except 5 MG on Saturdays) which was changed due to subtherapeutic INR. 3. Recommend labs (CBC and BMP) in 3 days.  Home Health: None Equipment/Devices: None    Discharge Condition: Improved and stable  CODE STATUS: Full  Diet recommendation: Heart healthy and diabetic diet.  Discharge Diagnoses:  Principal Problem:   Sepsis ([place]) Active Problems:   Coronary atherosclerosis   Diabetes mellitus (HCC)   Essential hypertension   HLD (hyperlipidemia)   Paroxysmal atrial fibrillation (HCC)   Acute bronchitis   Brief/Interim Summary: 76 year old male with PMH of CAD, PAF on Coumadin, CVA with residual right hemiparesis, HLD, HTN, DM 2 presented to ED with complaints of increasing weakness, productive cough. At baseline, he has right-sided weakness but able to ambulate using a walker. However for several hours prior to ED visit, he became progressively weak and unable to even ambulate with a walker and having difficulties getting out of bed or chair. Low-grade fevers at home and mild myalgia. In the ED, temperature 100.9, respiratory rate 29, WBC 11.9, creatinine 2.9, lactate 2.9 and chest x-ray showed no acute findings. Admitted for sepsis due to acute viral bronchitis. As per discussion with spouse, patient has been progressively declining, getting weaker since his stroke approximately 2 years ago. He also has intermittent confusion and on and off hallucinations at home.   Assessment & Plan:   1. Sepsis secondary to acute bronchitis: Met sepsis criteria on admission. Treated for sepsis protocol. Sepsis  physiology resolved. 2. Acute bronchitis: Likely viral but cannot rule out bacterial secondary infection. Chest x-ray 2 on 09/06/16: No acute disease. Influenza A & B PCR negative. Blood cultures 2: Negative to date. Urine culture: Greater than 100 K Staphylococcus epidermidis-likely a contaminant. Urine microscopy had no bacteria. Pro-calcitonin 0.19. Lactate has normalized. Treat supportively. No clinical bronchospasm. Changed to levofloxacin to oral doxycycline to complete a total 5 days treatment. This change was made to minimize side effect of confusion. Improved. 3. Essential hypertension: Fluctuating and mildly uncontrolled. Continue amlodipine and metoprolol. Follow at SNF and adjust medications as needed. 4. Paroxysmal A. fib: Sinus rhythm on telemetry. Continue metoprolol and Coumadin per pharmacy. INR 1. 41. Discussed with pharmacy and we'll discharge on Coumadin 5 MG daily until INR becomes therapeutic and then dose can be adjusted as deemed necessary. 5. Type II DM: A1c 9 suggesting poor outpatient control. Compliance may be an issue. Continue Lantus 30 units at bedtime and SSI. Mildly uncontrolled in the hospital. Discontinue Glucotrol. 6. Stage IV chronic kidney disease: Presented with creatinine of 2.95. Improved to 2.5. Not clinically volume overloaded and Lasix discontinued. Current creatinine may be at baseline. Follow BMP closely at SNF. 7. CVA: No significant residual right hemiparesis. PT evaluated and recommended SNF. Spouse works and is unable to assist patient 24/7. Patient has been progressively weaker over the last 2 years and also has issues with intermittent confusion. Coumadin as discussed above. 8. HLD: Statins 9. Anemia:  stable.   Consultants:   None  Procedures:   None     Discharge Instructions  Discharge Instructions    Call MD for:  difficulty breathing, headache or visual disturbances  Complete by:  As directed    Call MD for:  extreme fatigue     Complete by:  As directed    Call MD for:  persistant dizziness or light-headedness    Complete by:  As directed    Call MD for:  persistant nausea and vomiting    Complete by:  As directed    Call MD for:  severe uncontrolled pain    Complete by:  As directed    Call MD for:  temperature >100.4    Complete by:  As directed    Diet - low sodium heart healthy    Complete by:  As directed    Diet Carb Modified    Complete by:  As directed    Increase activity slowly    Complete by:  As directed        Medication List    STOP taking these medications   furosemide 20 MG tablet Commonly known as:  LASIX   glipiZIDE 5 MG tablet Commonly known as:  GLUCOTROL     TAKE these medications   amLODipine 5 MG tablet Commonly known as:  NORVASC Take 5 mg by mouth daily.   doxycycline 100 MG tablet Commonly known as:  VIBRA-TABS Take 1 tablet (100 mg total) by mouth 2 (two) times daily. Discontinue after 09/10/16 doses.   fenofibrate 160 MG tablet Take 160 mg by mouth daily.   insulin aspart 100 UNIT/ML injection Commonly known as:  novoLOG Inject 0-9 Units into the skin 3 (three) times daily with meals. CBG < 70: implement hypoglycemia protocol CBG 70 - 120: 0 units CBG 121 - 150: 1 unit CBG 151 - 200: 2 units CBG 201 - 250: 3 units CBG 251 - 300: 5 units CBG 301 - 350: 7 units CBG 351 - 400: 9 units CBG > 400: call MD.   insulin glargine 100 UNIT/ML injection Commonly known as:  LANTUS Inject 0.3 mLs (30 Units total) into the skin at bedtime.   metoprolol succinate 50 MG 24 hr tablet Commonly known as:  TOPROL-XL Take 50 mg by mouth daily.   pravastatin 80 MG tablet Commonly known as:  PRAVACHOL Take 80 mg by mouth daily.   vitamin B-12 100 MCG tablet Commonly known as:  CYANOCOBALAMIN Take 100 mcg by mouth daily.   warfarin 5 MG tablet Commonly known as:  COUMADIN Take 1 tablet (5 mg total) by mouth daily at 6 PM. Follow daily PT & INR for the next 2-3 days and  adjust Coumadin dose as needed. Start taking on:  09/09/2016 What changed:  See the new instructions.       Contact information for follow-up providers    M.D. at SNF. Schedule an appointment as soon as possible for a visit.   Why:  Follow-up labs (daily PT, INR & CBC and BMP in 3 days) and adjust Coumadin dose as needed to achieve therapeutic range. Follow-up with MD in 3 days.           Contact information for after-discharge care    Destination    [facility] Follow up.   Specialty:  [place] information: [place] [phone]                 Allergies  Allergen Reactions  . Ace Inhibitors Other (See Comments)    Renal failure     Procedures/Studies: Dg Chest 2 View  Result Date:  09/06/2016 CLINICAL DATA:  Pt from home with fever, weakness, and fatigue. Pt states his symptoms began 3 days ago. Wife with same illness. Hx HTN, CAD, MI x2 EXAM: CHEST  2 VIEW COMPARISON:  11/25/2007 FINDINGS: The heart size and mediastinal contours are within normal limits. Both lungs are clear. No pleural effusion or pneumothorax. Skeletal structures are intact. IMPRESSION: No active cardiopulmonary disease. Electronically Signed   By: Lajean Manes M.D.   On: 09/06/2016 12:32   Dg Chest Port 1 View  Result Date: 09/06/2016 CLINICAL DATA:  Sepsis. EXAM: PORTABLE CHEST 1 VIEW COMPARISON:  July 07, 2017 FINDINGS: The heart size and mediastinal contours are within normal limits. Both lungs are clear. The visualized skeletal structures are unremarkable. IMPRESSION: No active disease. Electronically Signed   By: Dorise Bullion III M.D   On: 09/06/2016 18:22      Subjective: Seen this morning. Denied complaints. Denied cough, dyspnea, chest pain, fever or body aches. Discussed extensively with patient's spouse. She confirms intermittent confusion and hallucinations at home. At times  confused in the hospital this morning per nursing. Not agitated however.  Discharge Exam:  Vitals:   09/07/16 1037 09/07/16 1548 09/07/16 2146 09/08/16 0656  BP: (!) 159/71 (!) 145/65 (!) 164/74 (!) 152/64  Pulse:  81 86 74  Resp:  (!) _0 Temp:  98.2 F (36.8 C) 98.1 F (36.7 C) 98.2 F (36.8 C)  TempSrc:  Oral Oral Oral  SpO2:  93% 95% 98%  Weight:      Height:        General exam: Pleasant elderly male lying comfortably supine in bed. Respiratory system: Clear to auscultation. Respiratory effort normal. Cardiovascular system: S1 & S2 heard, RRR. No JVD, murmurs, rubs, gallops or clicks. No pedal edema. Telemetry: Sinus rhythm. Gastrointestinal system: Abdomen is nondistended, soft and nontender. No organomegaly or masses felt. Normal bowel sounds heard. Central nervous system: Alert and oriented 2. No focal neurological deficits. Extremities: Symmetric 5 x 5 power. Skin: No rashes, lesions or ulcers Psychiatry: Judgement and insight appear impaired. Mood & affect appropriate.    The results of significant diagnostics from this hospitalization (including imaging, microbiology, ancillary and laboratory) are listed below for reference.     Microbiology: Recent Results (from the past 240 hour(s))  Culture, blood (Routine X 2) w Reflex to ID Panel     Status: None (Preliminary result)   Collection Time: 09/06/16 12:38 PM  Result Value Ref Range Status   Specimen Description BLOOD LEFT FOREARM  Final   Special Requests BOTTLES DRAWN AEROBIC AND ANAEROBIC 10CC  Final   Culture NO GROWTH 2 DAYS  Final   Report Status PENDING  Incomplete  Culture, blood (Routine X 2) w Reflex to ID Panel     Status: None (Preliminary result)   Collection Time: 09/06/16 12:38 PM  Result Value Ref Range Status   Specimen Description BLOOD RIGHT FOREARM  Final   Special Requests BOTTLES DRAWN AEROBIC AND ANAEROBIC 5CC  Final   Culture NO GROWTH 2 DAYS  Final   Report Status PENDING   Incomplete  Urine culture     Status: Abnormal   Collection Time: 09/06/16  1:03 PM  Result Value Ref Range Status   Specimen Description URINE, RANDOM  Final   Special Requests NONE  Final   Culture >=100,000 COLONIES/mL STAPHYLOCOCCUS EPIDERMIDIS (A)  Final   Report Status 09/08/2016 FINAL  Final   Organism ID, Bacteria STAPHYLOCOCCUS EPIDERMIDIS (A)  Final  Susceptibility   Staphylococcus epidermidis - MIC*    CIPROFLOXACIN <=0.5 SENSITIVE Sensitive     GENTAMICIN <=0.5 SENSITIVE Sensitive     NITROFURANTOIN <=16 SENSITIVE Sensitive     OXACILLIN <=0.25 SENSITIVE Sensitive     TETRACYCLINE 2 SENSITIVE Sensitive     VANCOMYCIN 1 SENSITIVE Sensitive     TRIMETH/SULFA <=10 SENSITIVE Sensitive     CLINDAMYCIN <=0.25 SENSITIVE Sensitive     RIFAMPIN <=0.5 SENSITIVE Sensitive     Inducible Clindamycin NEGATIVE Sensitive     * >=100,000 COLONIES/mL STAPHYLOCOCCUS EPIDERMIDIS     Labs:  Basic Metabolic Panel:  Recent Labs Lab 09/06/16 1157 09/07/16 0647 09/08/16 0448  NA 137 139 139  K 4.2 4.0 4.4  CL 103 109 109  CO2 _0 GLUCOSE 230* 156* 140*  BUN 33* 31* 33*  CREATININE 2.95* 2.40* 2.54*  CALCIUM 9.3 8.2* 9.1   Liver Function Tests:  Recent Labs Lab 09/06/16 1157 09/07/16 0647  AST 25 22  ALT 19 14*  ALKPHOS 41 36*  BILITOT 1.1 0.6  PROT 7.1 5.6*  ALBUMIN 3.2* 2.3*   CBC:  Recent Labs Lab 09/06/16 1157 09/07/16 0647 09/08/16 0448  WBC 11.9* 7.0 10.5  NEUTROABS 7.8*  --   --   HGB 12.8* 10.9* 11.8*  HCT 39.8 34.4* 35.9*  MCV 86.5 86.4 84.9  PLT 277 204 251   CBG:  Recent Labs Lab 09/07/16 0753 09/07/16 1137 09/07/16 2121 09/08/16 0841 09/08/16 1256  GLUCAP 150* 171* 259* 120* 184*   Hgb A1c  Recent Labs  09/06/16 1215  HGBA1C 9.0*   Urinalysis    Component Value Date/Time   COLORURINE YELLOW 09/06/2016 1303   APPEARANCEUR HAZY (A) 09/06/2016 1303   LABSPEC 1.016 09/06/2016 1303   PHURINE 6.0 09/06/2016 1303    GLUCOSEU >=500 (A) 09/06/2016 1303   HGBUR MODERATE (A) 09/06/2016 1303   BILIRUBINUR NEGATIVE 09/06/2016 1303   KETONESUR 5 (A) 09/06/2016 1303   PROTEINUR 30 (A) 09/06/2016 1303   UROBILINOGEN 0.2 10/25/2014 2241   NITRITE NEGATIVE 09/06/2016 1303   LEUKOCYTESUR SMALL (A) 09/06/2016 1303    Discussed in detail with patient's spouse. Updated care and answered questions.  Time coordinating discharge: Over 30 minutes  SIGNED:  Vernell Leep, MD, FACP, [place]. [facility] Pager [phone]  If 7PM-7AM, please contact night-coverage [email or web address] Password [email or web address] 09/08/2016, 2:54 PM

## 2016-09-08 NOTE — Care Management Note (Signed)
Case Management Note  Patient Details  Name: [name] [name] MRN: [id number] Date of Birth: 03-Sep-1940  Subjective/Objective:                    Action/Plan:  Patient to DC to SNF today as facilitated by CSW.   Expected Discharge Date:  09/08/16               Expected Discharge Plan:  Skilled Nursing Facility  In-House Referral:  Clinical Social Work  Discharge planning Services  CM Consult  Post Acute Care Choice:    Choice offered to:     DME Arranged:    DME Agency:     HH Arranged:    [place] Agency:     Status of Service:  Completed, signed off  If discussed at [employer], dates discussed:    Additional Comments:  Carles Collet, RN 09/08/2016, 2:49 PM

## 2016-09-09 DIAGNOSIS — J209 Acute bronchitis, unspecified: Secondary | ICD-10-CM | POA: Diagnosis not present

## 2016-09-09 LAB — GLUCOSE, CAPILLARY
GLUCOSE-CAPILLARY: 159 mg/dL — AB (ref 65–99)
Glucose-Capillary: 107 mg/dL — ABNORMAL HIGH (ref 65–99)

## 2016-09-09 LAB — PROTIME-INR
INR: 1.72
PROTHROMBIN TIME: 20.3 s — AB (ref 11.4–15.2)

## 2016-09-09 MED ORDER — LORAZEPAM 0.5 MG PO TABS: 0.5000 mg | ORAL_TABLET | Freq: Two times a day (BID) | ORAL | 0 refills | Status: DC | PRN

## 2016-09-09 NOTE — Discharge Summary (Signed)
Physician Discharge Summary  [name] [name] WUJ:[id number] DOB: [birth year]  PCP: No PCP Per Patient  Admit date: 09/06/2016 Discharge date: 09/09/2016  Recommendations for Outpatient Follow-up:  1. M.D. at SNF in 3 days.Please follow final blood culture results that were sent from the hospital. 2. Recommend daily labs (PT & INR) and adjust Coumadin dose as needed. Prior to hospital admission, patient was on a reduced/different Coumadin regimen (2.5 MG daily except 5 MG on Saturdays) which was changed due to subtherapeutic INR. 3. Recommend labs (CBC and BMP) in 3 days.  Home Health: None Equipment/Devices: None    Discharge Condition: Improved and stable  CODE STATUS: Full  Diet recommendation: Heart healthy and diabetic diet.  Discharge Diagnoses:  Principal Problem:   Sepsis ([place]) Active Problems:   Coronary atherosclerosis   Diabetes mellitus (HCC)   Essential hypertension   HLD (hyperlipidemia)   Paroxysmal atrial fibrillation (HCC)   Acute bronchitis   Brief/Interim Summary: 76 year old male with PMH of CAD, PAF on Coumadin, CVA with residual right hemiparesis, HLD, HTN, DM 2 presented to ED with complaints of increasing weakness, productive cough. At baseline, he has right-sided weakness but able to ambulate using a walker. However for several hours prior to ED visit, he became progressively weak and unable to even ambulate with a walker and having difficulties getting out of bed or chair. Low-grade fevers at home and mild myalgia. In the ED, temperature 100.9, respiratory rate 29, WBC 11.9, creatinine 2.9, lactate 2.9 and chest x-ray showed no acute findings. Admitted for sepsis due to acute viral bronchitis. As per discussion with spouse, patient has been progressively declining, getting weaker since his stroke approximately 2 years ago. He also has intermittent confusion and on and off hallucinations at home.   Assessment & Plan:   1. Sepsis secondary to acute bronchitis:  Met sepsis criteria on admission. Treated for sepsis protocol. Sepsis physiology resolved. 2. Acute bronchitis: Likely viral but cannot rule out bacterial secondary infection. Chest x-ray 2 on 09/06/16: No acute disease. Influenza A & B PCR negative. Blood cultures 2: Negative to date. Urine culture: Greater than 100 K Staphylococcus epidermidis-likely a contaminant. Urine microscopy had no bacteria. Pro-calcitonin 0.19. Lactate has normalized. Treat supportively. No clinical bronchospasm. Changed to levofloxacin to oral doxycycline to complete a total 5 days treatment. This change was made to minimize side effect of confusion. Improved. 3. Essential hypertension: Fluctuating and mildly uncontrolled. Continue amlodipine and metoprolol. Follow at SNF and adjust medications as needed. 4. Paroxysmal A. fib: Sinus rhythm on telemetry. Continue metoprolol and Coumadin per pharmacy. INR 1. 7. Discussed with pharmacy and we'll discharge on Coumadin 5 MG daily until INR becomes therapeutic and then dose can be adjusted as deemed necessary. 5. Type II DM: A1c 9 suggesting poor outpatient control. Compliance may be an issue. Continue Lantus 30 units at bedtime and SSI. Mildly uncontrolled in the hospital. Discontinued Glucotrol. 6. Stage IV chronic kidney disease: Presented with creatinine of 2.95. Improved to 2.5. Not clinically volume overloaded and Lasix discontinued. Current creatinine may be at baseline. Follow BMP closely at SNF. 7. CVA: No significant residual right hemiparesis. PT evaluated and recommended SNF. Spouse works and is unable to assist patient 24/7. Patient has been progressively weaker over the last 2 years and also has issues with intermittent confusion. Coumadin as discussed above. 8. HLD: Statins 9. Anemia:  stable. 10. Acute confusion/delirium: At baseline, patient has intermittent confusion and even some hallucinations at home. He had been discharged to go to  SNF on 1/15. However later that  afternoon, he became progressively more confused, agitated with increasing hallucinations. Discharge was canceled. 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 He was given Ativan 1 mg by mouth 1. Haldol was avoided due to prolonged QTC 487 ms. With this he has improved without any further agitation or hallucinations. Continue Ativan 0.5 MG twice a day when necessary for agitation, at discharge.   Consultants:   None  Procedures:   None     Discharge Instructions  Discharge Instructions    Call MD for:  difficulty breathing, headache or visual disturbances    Complete by:  As directed    Call MD for:  extreme fatigue    Complete by:  As directed    Call MD for:  persistant dizziness or light-headedness    Complete by:  As directed    Call MD for:  persistant nausea and vomiting    Complete by:  As directed    Call MD for:  severe uncontrolled pain    Complete by:  As directed    Call MD for:  temperature >100.4    Complete by:  As directed    Diet - low sodium heart healthy    Complete by:  As directed    Diet Carb Modified    Complete by:  As directed    Increase activity slowly    Complete by:  As directed        Medication List    STOP taking these medications   furosemide 20 MG tablet Commonly known as:  LASIX   glipiZIDE 5 MG tablet Commonly known as:  GLUCOTROL     TAKE these medications   amLODipine 5 MG tablet Commonly known as:  NORVASC Take 5 mg by mouth daily.   doxycycline 100 MG tablet Commonly known as:  VIBRA-TABS Take 1 tablet (100 mg total) by mouth 2 (two) times daily. Discontinue after 09/10/16 doses.   fenofibrate 160 MG tablet Take 160 mg by mouth daily.   insulin aspart 100 UNIT/ML injection Commonly known as:  novoLOG Inject 0-9 Units into the skin 3 (three) times daily with  meals. CBG < 70: implement hypoglycemia protocol CBG 70 - 120: 0 units CBG 121 - 150: 1 unit CBG 151 - 200: 2 units CBG 201 - 250: 3 units CBG 251 - 300: 5 units CBG 301 - 350: 7 units CBG 351 - 400: 9 units CBG > 400: call MD.   insulin glargine 100 UNIT/ML injection Commonly known as:  LANTUS Inject 0.3 mLs (30 Units total) into the skin at bedtime.   LORazepam 0.5 MG tablet Commonly known as:  ATIVAN Take 1 tablet (0.5 mg total) by mouth 2 (two) times daily as needed (agitation.).   metoprolol succinate 50 MG 24 hr tablet Commonly known as:  TOPROL-XL Take 50 mg by mouth daily.   pravastatin 80 MG tablet Commonly known as:  PRAVACHOL Take 80 mg by mouth daily.   vitamin B-12 100 MCG tablet Commonly known as:  CYANOCOBALAMIN Take 100 mcg by mouth daily.   warfarin 5 MG tablet Commonly known as:  COUMADIN Take 1 tablet (5 mg total) by mouth daily at 6 PM. Follow daily PT & INR for the next 2-3 days and adjust Coumadin dose as needed. What changed:  See the new instructions.       Contact information for  follow-up providers    M.D. at SNF. Schedule an appointment as soon as possible for a visit.   Why:  Follow-up labs (daily PT, INR & CBC and BMP in 3 days) and adjust Coumadin dose as needed to achieve therapeutic range. Follow-up with MD in 3 days.           Contact information for after-discharge care    Destination    [facility] Follow up.   Specialty:  [place] information: [place] [phone]                 Allergies  Allergen Reactions  . Ace Inhibitors Other (See Comments)    Renal failure     Procedures/Studies: Dg Chest 2 View  Result Date: 09/06/2016 CLINICAL DATA:  Pt from home with fever, weakness, and fatigue. Pt states his symptoms began 3 days ago. Wife with same illness. Hx HTN, CAD, MI x2 EXAM: CHEST  2 VIEW COMPARISON:  11/25/2007  FINDINGS: The heart size and mediastinal contours are within normal limits. Both lungs are clear. No pleural effusion or pneumothorax. Skeletal structures are intact. IMPRESSION: No active cardiopulmonary disease. Electronically Signed   By: Lajean Manes M.D.   On: 09/06/2016 12:32   Dg Chest Port 1 View  Result Date: 09/06/2016 CLINICAL DATA:  Sepsis. EXAM: PORTABLE CHEST 1 VIEW COMPARISON:  July 07, 2017 FINDINGS: The heart size and mediastinal contours are within normal limits. Both lungs are clear. The visualized skeletal structures are unremarkable. IMPRESSION: No active disease. Electronically Signed   By: Dorise Bullion III M.D   On: 09/06/2016 18:22      Subjective: Seen this morning. Denied complaints. Had finished eating breakfast. Denied dyspnea, cough or chest pain. Oriented to self and place. As per RN, no acute events or agitation/hallucinations overnight and this morning. Has been cooperative this morning.  Discharge Exam:  Vitals:   09/08/16 0656 09/08/16 2351 09/09/16 0212 09/09/16 0536  BP: (!) 152/64 (!) 141/68  (!) 155/63  Pulse: 74 68  65  Resp: _0 Temp: 98.2 F (36.8 C) 98.5 F (36.9 C)  97.8 F (36.6 C)  TempSrc: Oral Oral  Oral  SpO2: 98% 95%  95%  Weight:   77.1 kg (170 lb)   Height:        General exam: Pleasant elderly male sitting up comfortably in bed. Respiratory system: Clear to auscultation. Respiratory effort normal. Cardiovascular system: S1 & S2 heard, RRR. No JVD, murmurs, rubs, gallops or clicks. No pedal edema.  Gastrointestinal system: Abdomen is nondistended, soft and nontender. No organomegaly or masses felt. Normal bowel sounds heard. Central nervous system: Alert and oriented 2. No focal neurological deficits. Extremities: Symmetric 5 x 5 power. Skin: No rashes, lesions or ulcers Psychiatry: Judgement and insight appear impaired. Mood & affect appropriate.    The results of significant diagnostics from this  hospitalization (including imaging, microbiology, ancillary and laboratory) are listed below for reference.     Microbiology: Recent Results (from the past 240 hour(s))  Culture, blood (Routine X 2) w Reflex to ID Panel     Status: None (Preliminary result)   Collection Time: 09/06/16 12:38 PM  Result Value Ref Range Status   Specimen Description BLOOD LEFT FOREARM  Final   Special Requests BOTTLES DRAWN AEROBIC AND ANAEROBIC 10CC  Final   Culture NO GROWTH 2 DAYS  Final   Report Status PENDING  Incomplete  Culture, blood (Routine X 2) w Reflex to ID Panel     Status: None (Preliminary result)   Collection Time: 09/06/16 12:38 PM  Result Value Ref Range Status   Specimen Description BLOOD RIGHT FOREARM  Final   Special Requests BOTTLES DRAWN AEROBIC AND ANAEROBIC 5CC  Final   Culture NO GROWTH 2 DAYS  Final   Report Status PENDING  Incomplete  Urine culture     Status: Abnormal   Collection Time: 09/06/16  1:03 PM  Result Value Ref Range Status   Specimen Description URINE, RANDOM  Final   Special Requests NONE  Final   Culture >=100,000 COLONIES/mL STAPHYLOCOCCUS EPIDERMIDIS (A)  Final   Report Status 09/08/2016 FINAL  Final   Organism ID, Bacteria STAPHYLOCOCCUS EPIDERMIDIS (A)  Final      Susceptibility   Staphylococcus epidermidis - MIC*    CIPROFLOXACIN <=0.5 SENSITIVE Sensitive     GENTAMICIN <=0.5 SENSITIVE Sensitive     NITROFURANTOIN <=16 SENSITIVE Sensitive     OXACILLIN <=0.25 SENSITIVE Sensitive     TETRACYCLINE 2 SENSITIVE Sensitive     VANCOMYCIN 1 SENSITIVE Sensitive     TRIMETH/SULFA <=10 SENSITIVE Sensitive     CLINDAMYCIN <=0.25 SENSITIVE Sensitive     RIFAMPIN <=0.5 SENSITIVE Sensitive     Inducible Clindamycin NEGATIVE Sensitive     * >=100,000 COLONIES/mL STAPHYLOCOCCUS EPIDERMIDIS     Labs:  Basic Metabolic Panel:  Recent Labs Lab 09/06/16 1157 09/07/16 0647 09/08/16 0448  NA 137 139 139  K 4.2 4.0 4.4  CL 103 109 109  CO2 _0 GLUCOSE  230* 156* 140*  BUN 33* 31* 33*  CREATININE 2.95* 2.40* 2.54*  CALCIUM 9.3 8.2* 9.1   Liver Function Tests:  Recent Labs Lab 09/06/16 1157 09/07/16 0647  AST 25 22  ALT 19 14*  ALKPHOS 41 36*  BILITOT 1.1 0.6  PROT 7.1 5.6*  ALBUMIN 3.2* 2.3*   CBC:  Recent Labs Lab 09/06/16 1157 09/07/16 0647 09/08/16 0448  WBC 11.9* 7.0 10.5  NEUTROABS 7.8*  --   --   HGB 12.8* 10.9* 11.8*  HCT 39.8 34.4* 35.9*  MCV 86.5 86.4 84.9  PLT 277 204 251   CBG:  Recent Labs Lab 09/08/16 1256 09/08/16 1729 09/08/16 2152 09/09/16 0759 09/09/16 1203  GLUCAP 184* 234* 243* 107* 159*   Hgb A1c No results for input(s): HGBA1C in the last 72 hours. Urinalysis    Component Value Date/Time   COLORURINE YELLOW 09/06/2016 1303   APPEARANCEUR HAZY (A) 09/06/2016 1303   LABSPEC 1.016 09/06/2016 1303   PHURINE 6.0 09/06/2016 1303   GLUCOSEU >=500 (A) 09/06/2016 1303   HGBUR MODERATE (A) 09/06/2016 1303   BILIRUBINUR NEGATIVE 09/06/2016 1303   KETONESUR 5 (A) 09/06/2016 1303   PROTEINUR 30 (A) 09/06/2016 1303   UROBILINOGEN 0.2 10/25/2014 2241   NITRITE NEGATIVE 09/06/2016 1303   LEUKOCYTESUR SMALL (A) 09/06/2016 1303    Discussed in detail with patient's spouse on 1/15 and also discussed with son at bedside on 1/15. Updated care and answered questions.  Time coordinating discharge: Over 30 minutes  SIGNED:  Vernell Leep, MD, FACP, [place]. [facility] Pager [phone]  If 7PM-7AM, please contact night-coverage [email or web address] Password [facility] 09/09/2016, 12:55 PM

## 2016-09-09 NOTE — Progress Notes (Signed)
Patient was discharged to [facility] by MD order; discharged instructions  review and sent to facility via fax by Social Worker with care notes; IV DIC; patient will be transported to the facility via [place]. Facility was called and report was given to nurse Herbie [place].

## 2016-09-09 NOTE — Progress Notes (Signed)
Patient's discharge on 09/08/16 was canceled. Interviewed and examined the patient this morning. Discussed with RN. Clinically improved. Made changes to discharge summary/medications. Discussed with clinical Education officer, museum. DC to SNF today.  Vernell Leep, MD, FACP, FHM. [facility] Pager [phone]  If 7PM-7AM, please contact night-coverage [email or web address] Password [email or web address] 09/09/2016, 1:06 PM

## 2016-09-09 NOTE — Evaluation (Addendum)
Occupational Therapy Evaluation Patient Details Name: [name] [name] MRN: [id number] DOB: 1940-10-29 Today's Date: 09/09/2016    History of Present Illness 76 y.o. male with medical history significant of CAD, paroxysmal A. Fib on Coumadin anticoagulation therapy, CVA with residual right-sided weakness, hyperlipidemia, hypertension, diabetes mellitus who presented to the emergency department with complaints increasing weakness and cough with thick phlegm   Clinical Impression   PTA, pt reports independence with RW for ADL and functional mobility. Pt currently presents with decreased short-term memory, poor safety awareness, limited ability to follow one-step commands, and emergent awareness. He requires min assist with UB ADL and mod assist with LB ADL as well as mod assist for toilet transfers. He requires consistent VC's for safety throughout and was pleasantly confused during the majority of the session. Pt would benefit from short-term SNF placement for continued rehabilitation in order to maximize PLOF and independence with ADL. OT will continue to follow while admitted.    Follow Up Recommendations  SNF    Equipment Recommendations  Other (comment) (TBD at next venue of care)    Recommendations for Other Services       Precautions / Restrictions Precautions Precautions: Fall Restrictions Weight Bearing Restrictions: No      Mobility Bed Mobility Overal bed mobility: Needs Assistance Bed Mobility: Supine to Sit;Sit to Supine     Supine to sit: Mod assist Sit to supine: Min assist   General bed mobility comments: Increased time and effort required with min assist to raise trunk and scoot to EOB.  Transfers Overall transfer level: Needs assistance Equipment used: Rolling walker (2 wheeled) Transfers: Sit to/from Stand Sit to Stand: Mod assist         General transfer comment: Mod assist to power up to standing with RW. Once standing, pt singing and "dancing".     Balance Overall balance assessment: Needs assistance Sitting-balance support: Feet supported Sitting balance-Leahy Scale: Fair     Standing balance support: During functional activity;Bilateral upper extremity supported Standing balance-Leahy Scale: Poor Standing balance comment: Reliant on B UE support for safety.                            ADL Overall ADL's : Needs assistance/impaired Eating/Feeding: Sitting;Supervision/ safety   Grooming: Set up;Supervision/safety;Sitting   Upper Body Bathing: Minimal assistance;Sitting   Lower Body Bathing: Moderate assistance;Sit to/from stand   Upper Body Dressing : Minimal assistance;Sitting   Lower Body Dressing: Moderate assistance;Sit to/from stand   Toilet Transfer: Moderate assistance;Ambulation;RW;BSC   Toileting- Clothing Manipulation and Hygiene: Moderate assistance;Sit to/from stand       Functional mobility during ADLs: Moderate assistance;Rolling walker General ADL Comments: Pt pleasantly confused during session but did become tearful at end of session. He was able to complete simple grooming tasks sitting at EOB with supervision for safety.     Vision Vision Assessment?: No apparent visual deficits   Perception     Praxis      Pertinent Vitals/Pain Pain Assessment: Faces Faces Pain Scale: Hurts little more Pain Location: R foot Pain Descriptors / Indicators: Sore Pain Intervention(s): Monitored during session     Hand Dominance Right   Extremity/Trunk Assessment Upper Extremity Assessment Upper Extremity Assessment: Generalized weakness   Lower Extremity Assessment Lower Extremity Assessment: Generalized weakness       Communication Communication Communication: No difficulties   Cognition Arousal/Alertness: Awake/alert Behavior During Therapy: WFL for tasks assessed/performed Overall Cognitive Status: Impaired/Different from baseline Area of  Impairment:  Safety/judgement;Orientation;Following commands;Memory;Attention;Awareness Orientation Level: Time;Situation Current Attention Level: Sustained Memory: Decreased short-term memory Following Commands: Follows one step commands consistently;Follows multi-step commands inconsistently Safety/Judgement: Decreased awareness of deficits;Decreased awareness of safety Awareness: Emergent   General Comments: Pt oriented to self and place but difficulty with time and situation. Initially, pt happy and singing but becoming tearful at end of session but this resolved with reassurance. Required frequent VC's for safety and sequencing.    General Comments       Exercises       Shoulder Instructions      Home Living Family/patient expects to be discharged to:: Skilled nursing facility Living Arrangements: Spouse/significant other Available Help at Discharge: Family;Available PRN/intermittently Type of Home: House Home Access: Stairs to enter [employer] of Steps: 4 Entrance Stairs-Rails: Right Home Layout: One level     Bathroom Shower/Tub: Occupational psychologist: Standard     Home Equipment: Environmental consultant - 2 wheels   Additional Comments: No family available and majority of home living from PT. Unsure of reliability of pt's report at this time.      Prior Functioning/Environment Level of Independence: Independent with assistive device(s)        Comments: uses RW for mobility at home        OT Problem List: Decreased strength;Decreased range of motion;Decreased activity tolerance;Impaired balance (sitting and/or standing);Decreased safety awareness;Decreased knowledge of use of DME or AE;Decreased knowledge of precautions;Pain   OT Treatment/Interventions: Self-care/ADL training;Therapeutic exercise;Energy conservation;DME and/or AE instruction;Splinting;Therapeutic activities;Patient/family education;Balance training    OT Goals(Current goals can be found in the  care plan section) Acute Rehab OT Goals Patient Stated Goal: to go home OT Goal Formulation: With patient Time For Goal Achievement: 09/16/16 Potential to Achieve Goals: Good ADL Goals Pt Will Perform Upper Body Bathing: with supervision;sitting Pt Will Perform Lower Body Bathing: with supervision;sit to/from stand Pt Will Perform Upper Body Dressing: with supervision;sitting Pt Will Perform Lower Body Dressing: with supervision;sit to/from stand Pt Will Transfer to Toilet: with supervision;ambulating;bedside commode Pt Will Perform Toileting - Clothing Manipulation and hygiene: with supervision;sit to/from stand Pt Will Perform Tub/Shower Transfer: Tub transfer;with supervision;ambulating;rolling walker;shower seat;3 in 1  OT Frequency: Min 1X/week   Barriers to D/C:            Co-evaluation              End of Session Equipment Utilized During Treatment: Gait belt;Rolling walker  Activity Tolerance: Patient tolerated treatment well Patient left: in bed;with call bell/phone within reach;with bed alarm set   Time: 1139-1202 OT Time Calculation (min): 23 min Charges:  OT General Charges $OT Visit: 1 Procedure OT Evaluation $OT Eval Moderate Complexity: 1 Procedure OT Treatments $Self Care/Home Management : 8-22 mins    25-Sep-2016 1400  OT G-codes **NOT FOR INPATIENT CLASS**  Functional Assessment Tool Used clinical judgement  Functional Limitation Self care  Self Care Current Status [id number]) CJ  Self Care Goal Status [id number]) CI    Norman Herrlich, OTR/L [phone] 09-25-2016, 2:53 PM

## 2016-09-10 ENCOUNTER — Other Ambulatory Visit: Payer: Self-pay | Admitting: *Deleted

## 2016-09-10 MED ORDER — LORAZEPAM 0.5 MG PO TABS: ORAL_TABLET | ORAL | 5 refills | Status: DC

## 2016-09-10 NOTE — Telephone Encounter (Signed)
[facility] #: [phone] Fax#: [phone]  

## 2016-09-11 LAB — CULTURE, BLOOD (ROUTINE X 2)
CULTURE: NO GROWTH
Culture: NO GROWTH

## 2016-09-15 ENCOUNTER — Non-Acute Institutional Stay (SKILLED_NURSING_FACILITY): Payer: Medicare Other | Admitting: Adult Health

## 2016-09-15 ENCOUNTER — Encounter: Payer: Self-pay | Admitting: Adult Health

## 2016-09-15 DIAGNOSIS — N184 Chronic kidney disease, stage 4 (severe): Secondary | ICD-10-CM

## 2016-09-15 DIAGNOSIS — Z794 Long term (current) use of insulin: Secondary | ICD-10-CM | POA: Diagnosis not present

## 2016-09-15 DIAGNOSIS — E785 Hyperlipidemia, unspecified: Secondary | ICD-10-CM

## 2016-09-15 DIAGNOSIS — E1122 Type 2 diabetes mellitus with diabetic chronic kidney disease: Secondary | ICD-10-CM | POA: Diagnosis not present

## 2016-09-15 DIAGNOSIS — Z7901 Long term (current) use of anticoagulants: Secondary | ICD-10-CM

## 2016-09-15 DIAGNOSIS — I6350 Cerebral infarction due to unspecified occlusion or stenosis of unspecified cerebral artery: Secondary | ICD-10-CM

## 2016-09-15 DIAGNOSIS — I10 Essential (primary) hypertension: Secondary | ICD-10-CM | POA: Diagnosis not present

## 2016-09-15 DIAGNOSIS — I480 Paroxysmal atrial fibrillation: Secondary | ICD-10-CM | POA: Diagnosis not present

## 2016-09-15 DIAGNOSIS — E1165 Type 2 diabetes mellitus with hyperglycemia: Secondary | ICD-10-CM

## 2016-09-15 DIAGNOSIS — E1169 Type 2 diabetes mellitus with other specified complication: Secondary | ICD-10-CM

## 2016-09-15 DIAGNOSIS — G8191 Hemiplegia, unspecified affecting right dominant side: Secondary | ICD-10-CM

## 2016-09-15 DIAGNOSIS — I129 Hypertensive chronic kidney disease with stage 1 through stage 4 chronic kidney disease, or unspecified chronic kidney disease: Secondary | ICD-10-CM

## 2016-09-15 DIAGNOSIS — IMO0002 Reserved for concepts with insufficient information to code with codable children: Secondary | ICD-10-CM

## 2016-09-15 NOTE — Progress Notes (Addendum)
Location:  [facility] Room Number: 158 B Place of Service:  SNF (31)   CODE STATUS: Full Code  Allergies  Allergen Reactions  . Ace Inhibitors Other (See Comments)    Renal failure    Chief Complaint  Patient presents with  . Hospitalization Follow-up    [facility] stay from 09/06/16 to 09/09/16    HPI:  He has been hospitalized for sepsis related to acute bronchitis. The bronchitis is more than likely viral in nature; blood cultures were negative. He is not voicing any complaints at this time. There are no nursing concerns at this time. He is here for short term rehab with his goal to return back home.    Past Medical History:  Diagnosis Date  . CAD   . Cancer ([place])    skin  . CEREBROVASCULAR ACCIDENT   . CEREBROVASCULAR DISEASE   . DIABETES MELLITUS   . HYPERLIPIDEMIA   . HYPERTENSION   . MYOCARDIAL INFARCTION    x 2  . RENAL INSUFFICIENCY   . SUPRAVENTRICULAR TACHYCARDIA     Past Surgical History:  Procedure Laterality Date  . CORONARY STENT PLACEMENT  2002, 2003   x 4  . LOOP RECORDER IMPLANT N/A 10/27/2014   Procedure: LOOP RECORDER IMPLANT;  Surgeon: Deboraha Sprang, MD;  Location: [facility] CATH LAB;  Service: Cardiovascular;  Laterality: N/A;  . TEE WITHOUT CARDIOVERSION N/A 10/27/2014   Procedure: TRANSESOPHAGEAL ECHOCARDIOGRAM (TEE);  Surgeon: Dorothy Spark, MD;  Location: [facility] ENDOSCOPY;  Service: Cardiovascular;  Laterality: N/A;    Social History   Social History  . Marital status: Married    Spouse name: Gene  . Number of children: 0  . Years of education: [place]   Occupational History  . Retired Other   Social History Main Topics  . Smoking status: Former Research scientist (life sciences)  . Smokeless tobacco: Never Used  . Alcohol use No  . Drug use: No  . Sexual activity: Not Currently   Other Topics Concern  . Not on file   Social History Narrative   Patient lives at home with spouse.   Caffiene: 8 to 12oz daily   Family History  Problem Relation Age of  Onset  . Stroke Father       VITAL SIGNS BP (!) 145/71   Pulse 69   Temp 98.8 F (37.1 C)   Resp 20   Ht 5\' 5"  (1.651 m)   Wt 175 lb 3.2 oz (79.5 kg)   SpO2 96%   BMI 29.15 kg/m   Patient's Medications  New Prescriptions   No medications on file  Previous Medications   AMLODIPINE (NORVASC) 5 MG TABLET    Take 5 mg by mouth daily.   FENOFIBRATE 160 MG TABLET    Take 160 mg by mouth daily.   INSULIN ASPART (NOVOLOG) 100 UNIT/ML INJECTION    Inject 0-9 Units into the skin 3 (three) times daily with meals. CBG < 70: implement hypoglycemia protocol CBG 70 - 120: 0 units CBG 121 - 150: 1 unit CBG 151 - 200: 2 units CBG 201 - 250: 3 units CBG 251 - 300: 5 units CBG 301 - 350: 7 units CBG 351 - 400: 9 units CBG > 400: call MD.   INSULIN GLARGINE (LANTUS) 100 UNIT/ML INJECTION    Inject 0.3 mLs (30 Units total) into the skin at bedtime.   LORAZEPAM (ATIVAN) 0.5 MG TABLET    Take one tablet by mouth every 12 hours as needed for  anxiety   METOPROLOL (TOPROL-XL) 50 MG 24 HR TABLET    Take 50 mg by mouth daily.     PRAVASTATIN (PRAVACHOL) 80 MG TABLET    Take 80 mg by mouth daily.   VITAMIN B-12 (CYANOCOBALAMIN) 100 MCG TABLET    Take 100 mcg by mouth daily.   WARFARIN (COUMADIN) 5 MG TABLET    Take 1 tablet (5 mg total) by mouth daily at 6 PM. Follow daily PT & INR for the next 2-3 days and adjust Coumadin dose as needed.  Modified Medications   No medications on file  Discontinued Medications   DOXYCYCLINE (VIBRA-TABS) 100 MG TABLET    Take 1 tablet (100 mg total) by mouth 2 (two) times daily. Discontinue after 09/10/16 doses.     SIGNIFICANT DIAGNOSTIC EXAMS  09-06-16: chest x-ray: The heart size and mediastinal contours are within normal limits. Both lungs are clear. The visualized skeletal structures are Unremarkable.  LABS REVIEWED:   09-06-16: wbc 11.9; hgb 12.8; hct 39.8; mcv 86.5; plt 277; glucose 230; bun 33; creat 2.95; k+ 4.2; na++ 137; liver normal albumin 3.2; hgb  a1c 9.0: urine culture: staphylococcus epidermis 09-08-16; wbc 10.5; hgb 11.8; hct 35.9; mcv 849; ;plt 251; glucose 140; bun 33; creat 2.54; k+ 4.4; na++ 139    Review of Systems  Constitutional: Negative for malaise/fatigue.  Respiratory: Negative for cough and shortness of breath.   Cardiovascular: Negative for chest pain, palpitations and leg swelling.  Gastrointestinal: Negative for abdominal pain, constipation and heartburn.  Musculoskeletal: Negative for back pain, joint pain and myalgias.  Skin: Negative.   Neurological: Negative for dizziness.  Psychiatric/Behavioral: The patient is not nervous/anxious.     Physical Exam  Constitutional: No distress.  Eyes: Conjunctivae are normal.  Neck: Neck supple. No JVD present. No thyromegaly present.  Cardiovascular: Normal rate, regular rhythm and intact distal pulses.   Murmur heard. Respiratory: Effort normal and breath sounds normal. No respiratory distress. He has no wheezes.  GI: Soft. Bowel sounds are normal. He exhibits no distension. There is no tenderness.  Musculoskeletal: He exhibits no edema.  Right hemiparesis   Lymphadenopathy:    He has no cervical adenopathy.  Neurological: He is alert.  Skin: Skin is warm and dry. He is not diaphoretic.  Psychiatric: He has a normal mood and affect.     ASSESSMENT/ PLAN:  1. Hypertension; will continue norvasc 5 mg daily and toprol xl 50 mg daily   2. Dyslipidemia: will continue pravachol 80 mg daily and fenofibrate 160 mg daily   3. Diabetes: hgb a1c 9.0; will continue lantus 30 units nightly and novolog SSI: 121-150=1unit; 151-200=2 units; 201-250=3 units; 251-300=5 units; 301-350=7 units; 351-400=9 units.   4. Afib: heart rate is stable will continue toprol xl 50 mg daily for rate control is on long term  Coumadin therapy.   5. CVA: has carotid bruit  is neurologically stable;has right hemiparesis:  is on long term coumadin therapy.   6. Stage IV CKD: bun 33; creat  2.54  7. Anxiety will continue ativan 0.5 mg twice daily as needed    Time spent with patient  50   minutes >50% time spent counseling; reviewing medical record; tests; labs; and developing future plan of care   MD is aware of resident's narcotic use and is in agreement with current plan of care. We will attempt to wean resident as apropriate   Ok Edwards NP [facility]  Contact [phone] Monday through Friday 8am- 5pm  After hours call [phone]

## 2016-09-16 ENCOUNTER — Ambulatory Visit (INDEPENDENT_AMBULATORY_CARE_PROVIDER_SITE_OTHER): Payer: Medicare Other | Admitting: *Deleted

## 2016-09-16 ENCOUNTER — Encounter: Payer: Self-pay | Admitting: Internal Medicine

## 2016-09-16 ENCOUNTER — Non-Acute Institutional Stay (SKILLED_NURSING_FACILITY): Payer: Medicare Other | Admitting: Internal Medicine

## 2016-09-16 DIAGNOSIS — E782 Mixed hyperlipidemia: Secondary | ICD-10-CM

## 2016-09-16 DIAGNOSIS — N184 Chronic kidney disease, stage 4 (severe): Secondary | ICD-10-CM

## 2016-09-16 DIAGNOSIS — Z7901 Long term (current) use of anticoagulants: Secondary | ICD-10-CM

## 2016-09-16 DIAGNOSIS — G8191 Hemiplegia, unspecified affecting right dominant side: Secondary | ICD-10-CM

## 2016-09-16 DIAGNOSIS — I10 Essential (primary) hypertension: Secondary | ICD-10-CM | POA: Diagnosis not present

## 2016-09-16 DIAGNOSIS — I63412 Cerebral infarction due to embolism of left middle cerebral artery: Secondary | ICD-10-CM | POA: Diagnosis not present

## 2016-09-16 DIAGNOSIS — F411 Generalized anxiety disorder: Secondary | ICD-10-CM | POA: Diagnosis not present

## 2016-09-16 DIAGNOSIS — Z8673 Personal history of transient ischemic attack (TIA), and cerebral infarction without residual deficits: Secondary | ICD-10-CM | POA: Diagnosis not present

## 2016-09-16 DIAGNOSIS — Z794 Long term (current) use of insulin: Secondary | ICD-10-CM

## 2016-09-16 DIAGNOSIS — E1122 Type 2 diabetes mellitus with diabetic chronic kidney disease: Secondary | ICD-10-CM | POA: Diagnosis not present

## 2016-09-16 DIAGNOSIS — E1165 Type 2 diabetes mellitus with hyperglycemia: Secondary | ICD-10-CM | POA: Diagnosis not present

## 2016-09-16 DIAGNOSIS — IMO0002 Reserved for concepts with insufficient information to code with codable children: Secondary | ICD-10-CM

## 2016-09-16 NOTE — Progress Notes (Signed)
Patient ID: [name] [name], male   DOB: 04-25-1941, 76 y.o.   MRN: [id number]    HISTORY AND PHYSICAL   DATE:  09/16/2016  Location:    [facility] Room Number: 158 B Place of Service: SNF (31)   Extended Emergency Contact Information Primary Emergency Contact: Soza,Jean Address: [place] Phone: [phone] Relation: Spouse  Advanced Directive information Does Patient Have a Medical Advance Directive?: No, Would patient like information on creating a medical advance directive?: No - Patient declined  Chief Complaint  Patient presents with  . New Admit To SNF    HPI:  76 yo male seen today as a new admission into SNF following hospital stay for sepsis, acute bronchitis, prolonged QTc, A/CKD, PAF on coumadin with subtherapeutic INR, CAD, HTN, hx CVA, hyperlipidemia, DM. CXR showed no acute process. Influenza PCR neg; blood cx neg. He was given IV abx -->po doxy x 5 days. Cr 2.95-->2.54; albumin 3.2-->2.3; WBC peaked 11.9K-->10.5K; INR 1.33-->1.7; Hgb 11.8 at d/c. He presents to SNF for short term rehab.   Today he reports cough but no f/c, CP or SOB. No palpitations. No bleeding. No nursing concerns. Appetite ok. Sleeps well.  Hypertension - BP stable on norvasc 5 mg daily and toprol xl 50 mg daily   Dyslipidemia - stable on pravachol 80 mg daily and fenofibrate 160 mg daily. LDL 65 in 2016  DM - uncontrolled. a1c 9.0%. CBG 171 today. Takes lantus 30 units nightly and novolog SSI: 121-150=1unit; 151-200=2 units; 201-250=3 units; 251-300=5 units; 301-350=7 units; 351-400=9 units. No low BS reactions  Hx Afib - rate controlled on toprol xl 50 mg daily; on long term Coumadin therapy. Goal INR 2-3. 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 stable   CKD - stage 4. Cr 2.54  Anxiety - stable on ativan 0.5 mg twice daily as needed    Past Medical History:    Diagnosis Date  . CAD   . Cancer ([place])    skin  . CEREBROVASCULAR ACCIDENT   . CEREBROVASCULAR DISEASE   . DIABETES MELLITUS   . HYPERLIPIDEMIA   . HYPERTENSION   . MYOCARDIAL INFARCTION    x 2  . RENAL INSUFFICIENCY   . SUPRAVENTRICULAR TACHYCARDIA     Past Surgical History:  Procedure Laterality Date  . CORONARY STENT PLACEMENT  2002, 2003   x 4  . LOOP RECORDER IMPLANT N/A 10/27/2014   Procedure: LOOP RECORDER IMPLANT;  Surgeon: Deboraha Sprang, MD;  Location: [facility] CATH LAB;  Service: Cardiovascular;  Laterality: N/A;  . TEE WITHOUT CARDIOVERSION N/A 10/27/2014   Procedure: TRANSESOPHAGEAL ECHOCARDIOGRAM (TEE);  Surgeon: Dorothy Spark, MD;  Location: [facility] ENDOSCOPY;  Service: Cardiovascular;  Laterality: N/A;    Patient Care Team: No Pcp Per Patient as PCP - General (General Practice)  Social History   Social History  . Marital status: Married    Spouse name: Gene  . Number of children: 0  . Years of education: [place]   Occupational History  . Retired Other   Social History Main Topics  . Smoking status: Former Research scientist (life sciences)  . Smokeless tobacco: Never Used  . Alcohol use No  . Drug use: No  . Sexual activity: Not Currently   Other Topics Concern  . Not on file   Social History Narrative   Patient lives at home with spouse.  Caffiene: 8 to 12oz daily     reports that he has quit smoking. He has never used smokeless tobacco. He reports that he does not drink alcohol or use drugs.  Family History  Problem Relation Age of Onset  . Stroke Father    Family Status  Relation Status  . Father Deceased   "old age"  . Mother Deceased   "old age"  . Sister Deceased   "old age"  . Brother Deceased   war injuries  . Maternal Grandmother Deceased  . Maternal Grandfather Deceased  . Paternal Grandmother Deceased  . Paternal Grandfather Deceased    Immunization History  Administered Date(s) Administered  . Influenza, High Dose Seasonal PF 06/04/2015  . PPD Test  09/09/2016  . Pneumococcal Polysaccharide-23 12/01/2012  . Tdap 04/22/2011    Allergies  Allergen Reactions  . Ace Inhibitors Other (See Comments)    Renal failure    Medications: Patient's Medications  New Prescriptions   No medications on file  Previous Medications   AMLODIPINE (NORVASC) 5 MG TABLET    Take 5 mg by mouth daily.   FENOFIBRATE 160 MG TABLET    Take 160 mg by mouth daily.   INSULIN ASPART (NOVOLOG) 100 UNIT/ML INJECTION    Inject into the skin 3 (three) times daily before meals. Inject as per sliding scale 0-150=0 units, 151+=3units   INSULIN GLARGINE (LANTUS) 100 UNIT/ML INJECTION    Inject 0.3 mLs (30 Units total) into the skin at bedtime.   LORAZEPAM (ATIVAN) 0.5 MG TABLET    Take one tablet by mouth every 12 hours as needed for anxiety   METOPROLOL (TOPROL-XL) 50 MG 24 HR TABLET    Take 50 mg by mouth daily.     PRAVASTATIN (PRAVACHOL) 80 MG TABLET    Take 80 mg by mouth daily.   VITAMIN B-12 (CYANOCOBALAMIN) 100 MCG TABLET    Take 100 mcg by mouth daily.   WARFARIN (COUMADIN) 4 MG TABLET    Take 4 mg by mouth every evening.  Modified Medications   No medications on file  Discontinued Medications   INSULIN ASPART (NOVOLOG) 100 UNIT/ML INJECTION    Inject 0-9 Units into the skin 3 (three) times daily with meals. CBG < 70: implement hypoglycemia protocol CBG 70 - 120: 0 units CBG 121 - 150: 1 unit CBG 151 - 200: 2 units CBG 201 - 250: 3 units CBG 251 - 300: 5 units CBG 301 - 350: 7 units CBG 351 - 400: 9 units CBG > 400: call MD.   WARFARIN (COUMADIN) 5 MG TABLET    Take 1 tablet (5 mg total) by mouth daily at 6 PM. Follow daily PT & INR for the next 2-3 days and adjust Coumadin dose as needed.    Review of Systems  Respiratory: Positive for cough.   All other systems reviewed and are negative.   Vitals:   09/16/16 0931  BP: (!) 145/71  Pulse: 69  Resp: 20  Temp: 98.8 F (37.1 C)  TempSrc: Oral  SpO2: 96%  Weight: 175 lb 3.2 oz (79.5 kg)    Height: '5\' 5"'  (1.651 m)   Body mass index is 29.15 kg/m.  Physical Exam  Constitutional: He is oriented to person, place, and time. He appears well-developed.  Frail appearing in NAD, sitting on bed  HENT:  Mouth/Throat: Oropharynx is clear and moist. No oropharyngeal exudate.  MMM; no oral thrush  Eyes: Pupils are equal, round, and reactive to light. No scleral  icterus.  Neck: Neck supple. Carotid bruit is not present. No thyromegaly present.  Cardiovascular: Normal rate, regular rhythm and intact distal pulses.  Exam reveals no gallop and no friction rub.   Murmur (1/6 SEM) heard. no distal LE swelling. No calf TTP  Pulmonary/Chest: Effort normal and breath sounds normal. He has no wheezes. He has no rales. He exhibits no tenderness.  Abdominal: Soft. Bowel sounds are normal. He exhibits no distension, no abdominal bruit, no pulsatile midline mass and no mass. There is no hepatomegaly. There is no tenderness. There is no rebound and no guarding.  Musculoskeletal: He exhibits edema.  Lymphadenopathy:    He has no cervical adenopathy.  Neurological: He is alert and oriented to person, place, and time.  Right hemiparesis  Skin: Skin is warm and dry. No rash noted.  Psychiatric: He has a normal mood and affect. His behavior is normal. Judgment and thought content normal.     Labs reviewed: Admission on 09/06/2016, Discharged on 09/09/2016  Component Date Value Ref Range Status  . Prothrombin Time 09/06/2016 16.6* 11.4 - 15.2 seconds Final  . INR 09/06/2016 1.33   Final  . Sodium 09/06/2016 137  135 - 145 mmol/L Final  . Potassium 09/06/2016 4.2  3.5 - 5.1 mmol/L Final  . Chloride 09/06/2016 103  101 - 111 mmol/L Final  . CO2 09/06/2016 23  22 - 32 mmol/L Final  . Glucose, Bld 09/06/2016 230* 65 - 99 mg/dL Final  . BUN 09/06/2016 33* 6 - 20 mg/dL Final  . Creatinine, Ser 09/06/2016 2.95* 0.61 - 1.24 mg/dL Final  . Calcium 09/06/2016 9.3  8.9 - 10.3 mg/dL Final  . Total Protein  09/06/2016 7.1  6.5 - 8.1 g/dL Final  . Albumin 09/06/2016 3.2* 3.5 - 5.0 g/dL Final  . AST 09/06/2016 25  15 - 41 U/L Final  . ALT 09/06/2016 19  17 - 63 U/L Final  . Alkaline Phosphatase 09/06/2016 41  38 - 126 U/L Final  . Total Bilirubin 09/06/2016 1.1  0.3 - 1.2 mg/dL Final  . GFR calc non Af Amer 09/06/2016 19* >60 mL/min Final  . GFR calc Af Amer 09/06/2016 22* >60 mL/min Final   Comment: (NOTE) The eGFR has been calculated using the CKD EPI equation. This calculation has not been validated in all clinical situations. eGFR's persistently <60 mL/min signify possible Chronic Kidney Disease.   . Anion gap 09/06/2016 11  5 - 15 Final  . WBC 09/06/2016 11.9* 4.0 - 10.5 K/uL Final  . RBC 09/06/2016 4.60  4.22 - 5.81 MIL/uL Final  . Hemoglobin 09/06/2016 12.8* 13.0 - 17.0 g/dL Final  . HCT 09/06/2016 39.8  39.0 - 52.0 % Final  . MCV 09/06/2016 86.5  78.0 - 100.0 fL Final  . MCH 09/06/2016 27.8  26.0 - 34.0 pg Final  . MCHC 09/06/2016 32.2  30.0 - 36.0 g/dL Final  . RDW 09/06/2016 14.5  11.5 - 15.5 % Final  . Platelets 09/06/2016 277  150 - 400 K/uL Final  . Neutrophils Relative % 09/06/2016 72  % Final  . Neutro Abs 09/06/2016 7.8* 1.7 - 7.7 K/uL Final  . Lymphocytes Relative 09/06/2016 19  % Final  . Lymphs Abs 09/06/2016 2.1  0.7 - 4.0 K/uL Final  . Monocytes Relative 09/06/2016 8  % Final  . Monocytes Absolute 09/06/2016 0.8  0.1 - 1.0 K/uL Final  . Eosinophils Relative 09/06/2016 1  % Final  . Eosinophils Absolute 09/06/2016 0.1  0.0 - 0.7  K/uL Final  . Basophils Relative 09/06/2016 0  % Final  . Basophils Absolute 09/06/2016 0.0  0.0 - 0.1 K/uL Final  . Color, Urine 09/06/2016 YELLOW  YELLOW Final  . APPearance 09/06/2016 HAZY* CLEAR Final  . Specific Gravity, Urine 09/06/2016 1.016  1.005 - 1.030 Final  . pH 09/06/2016 6.0  5.0 - 8.0 Final  . Glucose, UA 09/06/2016 >=500* NEGATIVE mg/dL Final  . Hgb urine dipstick 09/06/2016 MODERATE* NEGATIVE Final  . Bilirubin Urine  09/06/2016 NEGATIVE  NEGATIVE Final  . Ketones, ur 09/06/2016 5* NEGATIVE mg/dL Final  . Protein, ur 09/06/2016 30* NEGATIVE mg/dL Final  . Nitrite 09/06/2016 NEGATIVE  NEGATIVE Final  . Leukocytes, UA 09/06/2016 SMALL* NEGATIVE Final  . RBC / HPF 09/06/2016 0-5  0 - 5 RBC/hpf Final  . WBC, UA 09/06/2016 6-30  0 - 5 WBC/hpf Final  . Bacteria, UA 09/06/2016 NONE SEEN  NONE SEEN Final  . Squamous Epithelial / LPF 09/06/2016 0-5* NONE SEEN Final  . Lactic Acid, Venous 09/06/2016 2.91* 0.5 - 1.9 mmol/L Final  . Comment 09/06/2016 NOTIFIED PHYSICIAN   Final  . Specimen Description 09/06/2016 BLOOD LEFT FOREARM   Final  . Special Requests 09/06/2016 BOTTLES DRAWN AEROBIC AND ANAEROBIC 10CC   Final  . Culture 09/06/2016 NO GROWTH 5 DAYS   Final  . Report Status 09/06/2016 09/11/2016 FINAL   Final  . Specimen Description 09/06/2016 BLOOD RIGHT FOREARM   Final  . Special Requests 09/06/2016 BOTTLES DRAWN AEROBIC AND ANAEROBIC 5CC   Final  . Culture 09/06/2016 NO GROWTH 5 DAYS   Final  . Report Status 09/06/2016 09/11/2016 FINAL   Final  . Influenza A By PCR 09/06/2016 NEGATIVE  NEGATIVE Final  . Influenza B By PCR 09/06/2016 NEGATIVE  NEGATIVE Final   Comment: (NOTE) The Xpert Xpress Flu assay is intended as an aid in the diagnosis of  influenza and should not be used as a sole basis for treatment.  This  assay is FDA approved for nasopharyngeal swab specimens only. Nasal  washings and aspirates are unacceptable for Xpert Xpress Flu testing.   Marland Kitchen Specimen Description 09/06/2016 URINE, RANDOM   Final  . Special Requests 09/06/2016 NONE   Final  . Culture 09/06/2016 >=100,000 COLONIES/mL STAPHYLOCOCCUS EPIDERMIDIS*  Final  . Report Status 09/06/2016 09/08/2016 FINAL   Final  . Organism ID, Bacteria 09/06/2016 STAPHYLOCOCCUS EPIDERMIDIS*  Final  . Procalcitonin 09/06/2016 0.19  ng/mL Final   Comment:        Interpretation: PCT (Procalcitonin) <= 0.5 ng/mL: Systemic infection (sepsis) is not  likely. Local bacterial infection is possible. (NOTE)         ICU PCT Algorithm               Non ICU PCT Algorithm    ----------------------------     ------------------------------         PCT < 0.25 ng/mL                 PCT < 0.1 ng/mL     Stopping of antibiotics            Stopping of antibiotics       strongly encouraged.               strongly encouraged.    ----------------------------     ------------------------------       PCT level decrease by               PCT < 0.25 ng/mL       >=  80% from peak PCT       OR PCT 0.25 - 0.5 ng/mL          Stopping of antibiotics                                             encouraged.     Stopping of antibiotics           encouraged.    ----------------------------     ------------------------------       PCT level decrease by              PCT >= 0.25 ng/mL       < 80% from peak PCT        AND PCT >= 0.5 ng/mL            Continuin                          g antibiotics                                              encouraged.       Continuing antibiotics            encouraged.    ----------------------------     ------------------------------     PCT level increase compared          PCT > 0.5 ng/mL         with peak PCT AND          PCT >= 0.5 ng/mL             Escalation of antibiotics                                          strongly encouraged.      Escalation of antibiotics        strongly encouraged.   . Lactic Acid, Venous 09/06/2016 0.91  0.5 - 1.9 mmol/L Final  . Hgb A1c MFr Bld 09/06/2016 9.0* 4.8 - 5.6 % Final   Comment: (NOTE)         Pre-diabetes: 5.7 - 6.4         Diabetes: >6.4         Glycemic control for adults with diabetes: <7.0   . Mean Plasma Glucose 09/06/2016 212  mg/dL Final   Comment: (NOTE) Performed At: [facility] [place], [place] [id number] Lindon Romp MD VQ:[id number]   . Sodium 09/07/2016 139  135 - 145 mmol/L Final  . Potassium 09/07/2016 4.0  3.5 - 5.1 mmol/L Final  .  Chloride 09/07/2016 109  101 - 111 mmol/L Final  . CO2 09/07/2016 24  22 - 32 mmol/L Final  . Glucose, Bld 09/07/2016 156* 65 - 99 mg/dL Final  . BUN 09/07/2016 31* 6 - 20 mg/dL Final  . Creatinine, Ser 09/07/2016 2.40* 0.61 - 1.24 mg/dL Final  . Calcium 09/07/2016 8.2* 8.9 - 10.3 mg/dL Final  . Total Protein 09/07/2016 5.6* 6.5 - 8.1 g/dL Final  . Albumin 09/07/2016 2.3* 3.5 - 5.0 g/dL Final  . AST 09/07/2016 22  15 -  41 U/L Final  . ALT 09/07/2016 14* 17 - 63 U/L Final  . Alkaline Phosphatase 09/07/2016 36* 38 - 126 U/L Final  . Total Bilirubin 09/07/2016 0.6  0.3 - 1.2 mg/dL Final  . GFR calc non Af Amer 09/07/2016 24* >60 mL/min Final  . GFR calc Af Amer 09/07/2016 28* >60 mL/min Final   Comment: (NOTE) The eGFR has been calculated using the CKD EPI equation. This calculation has not been validated in all clinical situations. eGFR's persistently <60 mL/min signify possible Chronic Kidney Disease.   . Anion gap 09/07/2016 6  5 - 15 Final  . WBC 09/07/2016 7.0  4.0 - 10.5 K/uL Final  . RBC 09/07/2016 3.98* 4.22 - 5.81 MIL/uL Final  . Hemoglobin 09/07/2016 10.9* 13.0 - 17.0 g/dL Final  . HCT 09/07/2016 34.4* 39.0 - 52.0 % Final  . MCV 09/07/2016 86.4  78.0 - 100.0 fL Final  . MCH 09/07/2016 27.4  26.0 - 34.0 pg Final  . MCHC 09/07/2016 31.7  30.0 - 36.0 g/dL Final  . RDW 09/07/2016 14.4  11.5 - 15.5 % Final  . Platelets 09/07/2016 204  150 - 400 K/uL Final  . Prothrombin Time 09/07/2016 18.2* 11.4 - 15.2 seconds Final  . INR 09/07/2016 1.49   Final  . Glucose-Capillary 09/06/2016 259* 65 - 99 mg/dL Final  . aPTT 09/06/2016 42* 24 - 36 seconds Final   Comment:        IF BASELINE aPTT IS ELEVATED, SUGGEST PATIENT RISK ASSESSMENT BE USED TO DETERMINE APPROPRIATE ANTICOAGULANT THERAPY.   . Glucose-Capillary 09/06/2016 189* 65 - 99 mg/dL Final  . Glucose-Capillary 09/07/2016 150* 65 - 99 mg/dL Final  . Glucose-Capillary 09/07/2016 171* 65 - 99 mg/dL Final  . Procalcitonin  09/08/2016 0.50  ng/mL Final   Comment:        Interpretation: PCT (Procalcitonin) <= 0.5 ng/mL: Systemic infection (sepsis) is not likely. Local bacterial infection is possible. (NOTE)         ICU PCT Algorithm               Non ICU PCT Algorithm    ----------------------------     ------------------------------         PCT < 0.25 ng/mL                 PCT < 0.1 ng/mL     Stopping of antibiotics            Stopping of antibiotics       strongly encouraged.               strongly encouraged.    ----------------------------     ------------------------------       PCT level decrease by               PCT < 0.25 ng/mL       >= 80% from peak PCT       OR PCT 0.25 - 0.5 ng/mL          Stopping of antibiotics                                             encouraged.     Stopping of antibiotics           encouraged.    ----------------------------     ------------------------------       PCT level  decrease by              PCT >= 0.25 ng/mL       < 80% from peak PCT        AND PCT >= 0.5 ng/mL            Continuin                          g antibiotics                                              encouraged.       Continuing antibiotics            encouraged.    ----------------------------     ------------------------------     PCT level increase compared          PCT > 0.5 ng/mL         with peak PCT AND          PCT >= 0.5 ng/mL             Escalation of antibiotics                                          strongly encouraged.      Escalation of antibiotics        strongly encouraged.   . Prothrombin Time 09/08/2016 17.4* 11.4 - 15.2 seconds Final  . INR 09/08/2016 1.41   Final  . WBC 09/08/2016 10.5  4.0 - 10.5 K/uL Final  . RBC 09/08/2016 4.23  4.22 - 5.81 MIL/uL Final  . Hemoglobin 09/08/2016 11.8* 13.0 - 17.0 g/dL Final  . HCT 09/08/2016 35.9* 39.0 - 52.0 % Final  . MCV 09/08/2016 84.9  78.0 - 100.0 fL Final  . MCH 09/08/2016 27.9  26.0 - 34.0 pg Final  . MCHC 09/08/2016 32.9   30.0 - 36.0 g/dL Final  . RDW 09/08/2016 14.2  11.5 - 15.5 % Final  . Platelets 09/08/2016 251  150 - 400 K/uL Final  . Sodium 09/08/2016 139  135 - 145 mmol/L Final  . Potassium 09/08/2016 4.4  3.5 - 5.1 mmol/L Final  . Chloride 09/08/2016 109  101 - 111 mmol/L Final  . CO2 09/08/2016 23  22 - 32 mmol/L Final  . Glucose, Bld 09/08/2016 140* 65 - 99 mg/dL Final  . BUN 09/08/2016 33* 6 - 20 mg/dL Final  . Creatinine, Ser 09/08/2016 2.54* 0.61 - 1.24 mg/dL Final  . Calcium 09/08/2016 9.1  8.9 - 10.3 mg/dL Final  . GFR calc non Af Amer 09/08/2016 23* >60 mL/min Final  . GFR calc Af Amer 09/08/2016 26* >60 mL/min Final   Comment: (NOTE) The eGFR has been calculated using the CKD EPI equation. This calculation has not been validated in all clinical situations. eGFR's persistently <60 mL/min signify possible Chronic Kidney Disease.   . Anion gap 09/08/2016 7  5 - 15 Final  . Glucose-Capillary 09/07/2016 259* 65 - 99 mg/dL Final  . Glucose-Capillary 09/08/2016 120* 65 - 99 mg/dL Final  . Comment 1 09/08/2016 Notify RN   Final  . Comment 2 09/08/2016 Document in Chart   Final  . Glucose-Capillary 09/08/2016 184* 65 - 99 mg/dL  Final  . Prothrombin Time 09/09/2016 20.3* 11.4 - 15.2 seconds Final  . INR 09/09/2016 1.72   Final  . Glucose-Capillary 09/08/2016 234* 65 - 99 mg/dL Final  . Glucose-Capillary 09/08/2016 243* 65 - 99 mg/dL Final  . Glucose-Capillary 09/09/2016 107* 65 - 99 mg/dL Final  . Glucose-Capillary 09/09/2016 159* 65 - 99 mg/dL Final  Anti-coag visit on 08/20/2016  Component Date Value Ref Range Status  . INR 08/20/2016 2.2   Final  Anti-coag visit on 07/30/2016  Component Date Value Ref Range Status  . INR 07/30/2016 3.2   Final  Clinical Support on 07/21/2016  Component Date Value Ref Range Status  . Date Time Interrogation Session 07/21/2016 [phone]   Final  . Pulse Generator Manufacturer 07/21/2016 MERM   Final  . Pulse Gen Model 07/21/2016 CNO70 Reveal LINQ    Final  . Pulse Gen Serial Number 07/21/2016 [id number]   Final  . Clinic Name 07/21/2016 [facility]   Final  . Implantable Pulse Generator Type 07/21/2016 ICM/ILR   Final  . Implantable Pulse Generator Implan* 07/21/2016 [id number]   Final  . Eval Rhythm 07/21/2016 SR w/ PVC   Final  Anti-coag visit on 07/10/2016  Component Date Value Ref Range Status  . INR 07/10/2016 4.9   Final  Anti-coag visit on 06/25/2016  Component Date Value Ref Range Status  . INR 06/25/2016 4.8   Final  Clinical Support on 06/18/2016  Component Date Value Ref Range Status  . Date Time Interrogation Session 06/18/2016 [phone]   Final  . Pulse Generator Manufacturer 06/18/2016 MERM   Final  . Pulse Gen Model 06/18/2016 [id number] Reveal LINQ   Final  . Pulse Gen Serial Number 06/18/2016 [id number]   Final  . Clinic Name 06/18/2016 [facility] [facility]   Final  . Implantable Pulse Generator Type 06/18/2016 ICM/ILR   Final  . Implantable Pulse Generator Implan* 06/18/2016 [id number]   Final  . Eval Rhythm 06/18/2016 SB/SR   Final    Dg Chest 2 View  Result Date: 09/06/2016 CLINICAL DATA:  Pt from home with fever, weakness, and fatigue. Pt states his symptoms began 3 days ago. Wife with same illness. Hx HTN, CAD, MI x2 EXAM: CHEST  2 VIEW COMPARISON:  11/25/2007 FINDINGS: The heart size and mediastinal contours are within normal limits. Both lungs are clear. No pleural effusion or pneumothorax. Skeletal structures are intact. IMPRESSION: No active cardiopulmonary disease. Electronically Signed   By: Lajean Manes M.D.   On: 09/06/2016 12:32   Dg Chest Port 1 View  Result Date: 09/06/2016 CLINICAL DATA:  Sepsis. EXAM: PORTABLE CHEST 1 VIEW COMPARISON:  July 07, 2017 FINDINGS: The heart size and mediastinal contours are within normal limits. Both lungs are clear. The visualized skeletal structures are unremarkable. IMPRESSION: No active disease. Electronically Signed   By: Dorise Bullion III M.D   On:  09/06/2016 18:22     Assessment/Plan   ICD-9-CM ICD-10-CM   1. 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 Essential hypertension 401.9 I10   3. History of stroke V12.54 Z86.73   4. Current use of long term anticoagulation V58.61 Z79.01   5. Mixed hyperlipidemia 272.2 E78.2   6. Anxiety state 300.00 F41.1   7. Stage 4 chronic kidney disease (HCC) 585.4 N18.4   8. Right hemiparesis ([place]) 342.90 G81.91  Repeat CBC and BMP in 3 days  Follow INR - goal is 2-3  PT/OT/ST as ordered  Nutritional supplements as ordered  f/u with specialists as scheduled  GOAL: short term rehab and d/c home when medically appropriate. Communicated with pt and nursing.  Will follow   Johnnie Goynes S. Perlie Gold  [facility] [address] [place], [place] [address] [phone] Cell (Monday-Friday 8 AM - 5 PM) [phone] After 5 PM and follow prompts

## 2016-09-17 NOTE — Progress Notes (Signed)
Carelink Summary Report / Loop Recorder 

## 2016-09-29 ENCOUNTER — Non-Acute Institutional Stay (SKILLED_NURSING_FACILITY): Payer: Medicare Other | Admitting: Adult Health

## 2016-09-29 ENCOUNTER — Encounter: Payer: Self-pay | Admitting: Adult Health

## 2016-09-29 DIAGNOSIS — Z794 Long term (current) use of insulin: Secondary | ICD-10-CM | POA: Diagnosis not present

## 2016-09-29 DIAGNOSIS — R410 Disorientation, unspecified: Secondary | ICD-10-CM | POA: Insufficient documentation

## 2016-09-29 DIAGNOSIS — I480 Paroxysmal atrial fibrillation: Secondary | ICD-10-CM

## 2016-09-29 DIAGNOSIS — IMO0002 Reserved for concepts with insufficient information to code with codable children: Secondary | ICD-10-CM | POA: Insufficient documentation

## 2016-09-29 DIAGNOSIS — N184 Chronic kidney disease, stage 4 (severe): Secondary | ICD-10-CM

## 2016-09-29 DIAGNOSIS — E1122 Type 2 diabetes mellitus with diabetic chronic kidney disease: Secondary | ICD-10-CM | POA: Diagnosis not present

## 2016-09-29 DIAGNOSIS — E1165 Type 2 diabetes mellitus with hyperglycemia: Secondary | ICD-10-CM | POA: Diagnosis not present

## 2016-09-29 DIAGNOSIS — I63412 Cerebral infarction due to embolism of left middle cerebral artery: Secondary | ICD-10-CM

## 2016-09-29 DIAGNOSIS — Z7901 Long term (current) use of anticoagulants: Secondary | ICD-10-CM | POA: Diagnosis not present

## 2016-09-29 DIAGNOSIS — E1129 Type 2 diabetes mellitus with other diabetic kidney complication: Secondary | ICD-10-CM | POA: Insufficient documentation

## 2016-09-29 DIAGNOSIS — I129 Hypertensive chronic kidney disease with stage 1 through stage 4 chronic kidney disease, or unspecified chronic kidney disease: Secondary | ICD-10-CM | POA: Insufficient documentation

## 2016-09-29 NOTE — Progress Notes (Signed)
Location:   Psychiatrist of Service:  SNF (31)   CODE STATUS: full code  Allergies  Allergen Reactions  . Ace Inhibitors Other (See Comments)    Renal failure    Chief Complaint  Patient presents with  . Acute Visit    staff concern     HPI:  Staff reports that he is more confused and having increased difficulty with therapy. His cbgs are all elevated with the range about 200-300. The hospital discharge summary reads that he has been intermittent confusion which is his baseline. He tells me that he is feeling good and has no complaints except that he is ready to go home as he has work to do there. His INR today is 3.7. He is on long term coumadin therapy.    Past Medical History:  Diagnosis Date  . CAD   . Cancer ([place])    skin  . CEREBROVASCULAR ACCIDENT   . CEREBROVASCULAR DISEASE   . DIABETES MELLITUS   . HYPERLIPIDEMIA   . HYPERTENSION   . MYOCARDIAL INFARCTION    x 2  . RENAL INSUFFICIENCY   . SUPRAVENTRICULAR TACHYCARDIA     Past Surgical History:  Procedure Laterality Date  . CORONARY STENT PLACEMENT  2002, 2003   x 4  . LOOP RECORDER IMPLANT N/A 10/27/2014   Procedure: LOOP RECORDER IMPLANT;  Surgeon: Deboraha Sprang, MD;  Location: [facility] CATH LAB;  Service: Cardiovascular;  Laterality: N/A;  . TEE WITHOUT CARDIOVERSION N/A 10/27/2014   Procedure: TRANSESOPHAGEAL ECHOCARDIOGRAM (TEE);  Surgeon: Dorothy Spark, MD;  Location: [facility] ENDOSCOPY;  Service: Cardiovascular;  Laterality: N/A;    Social History   Social History  . Marital status: Married    Spouse name: Gene  . Number of children: 0  . Years of education: [place]   Occupational History  . Retired Other   Social History Main Topics  . Smoking status: Former Research scientist (life sciences)  . Smokeless tobacco: Never Used  . Alcohol use No  . Drug use: No  . Sexual activity: Not Currently   Other Topics Concern  . Not on file   Social History Narrative   Patient lives at home with spouse.   Caffiene: 8 to  12oz daily   Family History  Problem Relation Age of Onset  . Stroke Father       VITAL SIGNS BP (!) 152/49   Pulse 65   Temp 99.6 F (37.6 C)   Resp 18   Ht 5\' 5"  (1.651 m)   Wt 175 lb 3.2 oz (79.5 kg)   SpO2 95%   BMI 29.15 kg/m   Patient's Medications  New Prescriptions   No medications on file  Previous Medications   AMLODIPINE (NORVASC) 5 MG TABLET    Take 5 mg by mouth daily.   FENOFIBRATE 160 MG TABLET    Take 160 mg by mouth daily.   INSULIN ASPART (NOVOLOG) 100 UNIT/ML INJECTION    Inject 3 Units into the skin 3 (three) times daily before meals. Inject as per sliding scale 0-150=0 units, 151+=3units    INSULIN GLARGINE (LANTUS) 100 UNIT/ML INJECTION    Inject 0.3 mLs (30 Units total) into the skin at bedtime.   LORAZEPAM (ATIVAN) 0.5 MG TABLET    Take one tablet by mouth every 12 hours as needed for anxiety   METOPROLOL (TOPROL-XL) 50 MG 24 HR TABLET    Take 50 mg by mouth daily.     PRAVASTATIN (PRAVACHOL)  80 MG TABLET    Take 80 mg by mouth daily.   VITAMIN B-12 (CYANOCOBALAMIN) 100 MCG TABLET    Take 100 mcg by mouth daily.   WARFARIN (COUMADIN) 4 MG TABLET    Take 5 mg by mouth every evening.   Modified Medications   No medications on file  Discontinued Medications   No medications on file     SIGNIFICANT DIAGNOSTIC EXAMS   09-06-16: chest x-ray: The heart size and mediastinal contours are within normal limits. Both lungs are clear. The visualized skeletal structures are Unremarkable.  LABS REVIEWED:   09-06-16: wbc 11.9; hgb 12.8; hct 39.8; mcv 86.5; plt 277; glucose 230; bun 33; creat 2.95; k+ 4.2; na++ 137; liver normal albumin 3.2; hgb a1c 9.0: urine culture: staphylococcus epidermis 09-08-16; wbc 10.5; hgb 11.8; hct 35.9; mcv 849; ;plt 251; glucose 140; bun 33; creat 2.54; k+ 4.4; na++ 139  09-22-16: INR 2.8:  09-29-16: INR 3.7   Review of Systems  Constitutional: Negative for malaise/fatigue.  Respiratory: Negative for cough and shortness of  breath.   Cardiovascular: Negative for chest pain, palpitations and leg swelling.  Gastrointestinal: Negative for abdominal pain, constipation and heartburn.  Musculoskeletal: Negative for back pain, joint pain and myalgias.  Skin: Negative.   Neurological: Negative for dizziness.  Psychiatric/Behavioral: The patient is not nervous/anxious.     Physical Exam  Constitutional: No distress.  Eyes: Conjunctivae are normal.  Neck: Neck supple. No JVD present. No thyromegaly present.  Cardiovascular: Normal rate, regular rhythm and intact distal pulses.  has murmur  Respiratory: Effort normal and breath sounds normal. No respiratory distress. He has no wheezes.  GI: Soft. Bowel sounds are normal. He exhibits no distension. There is no tenderness.  Musculoskeletal: He exhibits no edema.  Has right hemiplegia  Lymphadenopathy:    He has no cervical adenopathy.  Neurological: He is alert.  Skin: Skin is warm and dry. He is not diaphoretic.  Psychiatric: He has a normal mood and affect.     ASSESSMENT/ PLAN:  1. afib 2. Diabetes 3. Chronic anticoagulation  4. Intermittent confusion  Will lower coumadin to 3 mg daily will check inr in one week Will increase lantus to 35 units nightly  Will increase novolog to 6 units after meals with an additional 5 units for cbg >150 Will have ST perform minimental exam  With his intermittent confusion; could be related to his recent infection; but will need to look at dementia.   Time spent with patient  40  minutes >50% time spent counseling; reviewing medical record; tests; labs; and developing future plan of care    MD is aware of resident's narcotic use and is in agreement with current plan of care. We will attempt to wean resident as apropriate   Ok Edwards NP [facility]  Contact [phone] Monday through Friday 8am- 5pm  After hours call [phone]

## 2016-10-09 LAB — CUP PACEART REMOTE DEVICE CHECK
Implantable Pulse Generator Implant Date: 20160304
MDC IDC SESS DTM: 20171224233606

## 2016-10-09 NOTE — Progress Notes (Signed)
Carelink summary report received. Battery status OK. Normal device function. No new symptom episodes, tachy episodes, brady, or pause episodes. 56 AF 0.6% +warfarinMonthly summary reports and ROV/PRN

## 2016-10-16 ENCOUNTER — Ambulatory Visit (INDEPENDENT_AMBULATORY_CARE_PROVIDER_SITE_OTHER): Payer: Medicare Other | Admitting: *Deleted

## 2016-10-16 DIAGNOSIS — I63412 Cerebral infarction due to embolism of left middle cerebral artery: Secondary | ICD-10-CM

## 2016-10-17 NOTE — Progress Notes (Signed)
Carelink Summary Report / Loop Recorder 

## 2016-10-19 LAB — CUP PACEART REMOTE DEVICE CHECK
Implantable Pulse Generator Implant Date: 20160304
MDC IDC SESS DTM: 20180124003543

## 2016-10-20 LAB — POCT INR: INR: 2.2 — AB (ref 0.9–1.1)

## 2016-10-24 ENCOUNTER — Encounter: Payer: Self-pay | Admitting: Adult Health

## 2016-10-24 ENCOUNTER — Non-Acute Institutional Stay (SKILLED_NURSING_FACILITY): Payer: Medicare Other | Admitting: Adult Health

## 2016-10-24 DIAGNOSIS — I6350 Cerebral infarction due to unspecified occlusion or stenosis of unspecified cerebral artery: Secondary | ICD-10-CM

## 2016-10-24 DIAGNOSIS — E1122 Type 2 diabetes mellitus with diabetic chronic kidney disease: Secondary | ICD-10-CM

## 2016-10-24 DIAGNOSIS — E1169 Type 2 diabetes mellitus with other specified complication: Secondary | ICD-10-CM

## 2016-10-24 DIAGNOSIS — N184 Chronic kidney disease, stage 4 (severe): Secondary | ICD-10-CM

## 2016-10-24 DIAGNOSIS — Z7901 Long term (current) use of anticoagulants: Secondary | ICD-10-CM | POA: Diagnosis not present

## 2016-10-24 DIAGNOSIS — I129 Hypertensive chronic kidney disease with stage 1 through stage 4 chronic kidney disease, or unspecified chronic kidney disease: Secondary | ICD-10-CM | POA: Diagnosis not present

## 2016-10-24 DIAGNOSIS — E785 Hyperlipidemia, unspecified: Secondary | ICD-10-CM

## 2016-10-24 DIAGNOSIS — Z794 Long term (current) use of insulin: Secondary | ICD-10-CM | POA: Diagnosis not present

## 2016-10-24 DIAGNOSIS — I480 Paroxysmal atrial fibrillation: Secondary | ICD-10-CM

## 2016-10-24 DIAGNOSIS — G8191 Hemiplegia, unspecified affecting right dominant side: Secondary | ICD-10-CM

## 2016-10-24 DIAGNOSIS — IMO0002 Reserved for concepts with insufficient information to code with codable children: Secondary | ICD-10-CM

## 2016-10-24 DIAGNOSIS — E1165 Type 2 diabetes mellitus with hyperglycemia: Secondary | ICD-10-CM | POA: Diagnosis not present

## 2016-10-24 NOTE — Progress Notes (Signed)
Location:   [place] Room Number: 158 B Place of Service:  SNF (31)   CODE STATUS: DNR  Allergies  Allergen Reactions  . Ace Inhibitors Other (See Comments)    Renal failure    Chief Complaint  Patient presents with  . Medical Management of Chronic Issues    Routine Visit    HPI:  He is a short term resident of this facility being seen for the management of his chronic illnesses. Overall his status is stable. He is not voicing any complaints at this time. There are no nursing concerns at this time.   Past Medical History:  Diagnosis Date  . CAD   . Cancer ([place])    skin  . CEREBROVASCULAR ACCIDENT   . CEREBROVASCULAR DISEASE   . DIABETES MELLITUS   . HYPERLIPIDEMIA   . HYPERTENSION   . MYOCARDIAL INFARCTION    x 2  . RENAL INSUFFICIENCY   . SUPRAVENTRICULAR TACHYCARDIA     Past Surgical History:  Procedure Laterality Date  . CORONARY STENT PLACEMENT  2002, 2003   x 4  . LOOP RECORDER IMPLANT N/A 10/27/2014   Procedure: LOOP RECORDER IMPLANT;  Surgeon: Deboraha Sprang, MD;  Location: [facility] CATH LAB;  Service: Cardiovascular;  Laterality: N/A;  . TEE WITHOUT CARDIOVERSION N/A 10/27/2014   Procedure: TRANSESOPHAGEAL ECHOCARDIOGRAM (TEE);  Surgeon: Dorothy Spark, MD;  Location: [facility] ENDOSCOPY;  Service: Cardiovascular;  Laterality: N/A;    Social History   Social History  . Marital status: Married    Spouse name: Gene  . Number of children: 0  . Years of education: [place]   Occupational History  . Retired Other   Social History Main Topics  . Smoking status: Former Research scientist (life sciences)  . Smokeless tobacco: Never Used  . Alcohol use No  . Drug use: No  . Sexual activity: Not Currently   Other Topics Concern  . Not on file   Social History Narrative   Patient lives at home with spouse.   Caffiene: 8 to 12oz daily   Family History  Problem Relation Age of Onset  . Stroke Father       VITAL SIGNS BP (!) 115/52   Pulse 78   Temp (!) 95.6 F (35.3  C)   Resp 17   Ht 5\' 5"  (1.651 m)   Wt 174 lb 12.8 oz (79.3 kg)   SpO2 94% Comment: room air  BMI 29.09 kg/m   Patient's Medications  New Prescriptions   No medications on file  Previous Medications   AMINO ACIDS-PROTEIN HYDROLYS (FEEDING SUPPLEMENT, PRO-STAT SUGAR FREE 64,) LIQD    Take 30 mLs by mouth 2 (two) times daily.   AMLODIPINE (NORVASC) 5 MG TABLET    Take 5 mg by mouth daily.   FENOFIBRATE 160 MG TABLET    Take 160 mg by mouth daily.   INSULIN ASPART (NOVOLOG) 100 UNIT/ML INJECTION    Inject into the skin 3 (three) times daily before meals. Inject as per sliding scale: if 0-150 = 0 units, 151+ = 5 units subcutaneously before meals.  Inject 6 units after meals.   INSULIN GLARGINE (LANTUS) 100 UNIT/ML INJECTION    Inject 35 Units into the skin at bedtime.   METOPROLOL (TOPROL-XL) 50 MG 24 HR TABLET    Take 50 mg by mouth daily.     PRAVASTATIN (PRAVACHOL) 80 MG TABLET    Take 80 mg by mouth daily.   PROMETHAZINE (PHENERGAN) 25 MG/ML INJECTION  Inject 25 mg into the muscle every 6 (six) hours as needed for nausea or vomiting.   VITAMIN B-12 (CYANOCOBALAMIN) 100 MCG TABLET    Take 100 mcg by mouth daily.   WARFARIN (COUMADIN) 3 MG TABLET    Give 3.5 mg by mouth every evening   ZINC OXIDE 20 % OINTMENT    Apply 1 application topically daily.  Modified Medications   No medications on file  Discontinued Medications   AMINO ACIDS (AMINO ACID PROTEIN PO)    Take by mouth.   INSULIN ASPART (NOVOLOG) 100 UNIT/ML INJECTION    Inject 3 Units into the skin 3 (three) times daily before meals. Inject as per sliding scale 0-150=0 units, 151+=3units    INSULIN GLARGINE (LANTUS) 100 UNIT/ML INJECTION    Inject 0.3 mLs (30 Units total) into the skin at bedtime.   LORAZEPAM (ATIVAN) 0.5 MG TABLET    Take one tablet by mouth every 12 hours as needed for anxiety   WARFARIN (COUMADIN) 4 MG TABLET    Take 5 mg by mouth every evening.      SIGNIFICANT DIAGNOSTIC EXAMS  09-06-16: chest x-ray:  The heart size and mediastinal contours are within normal limits. Both lungs are clear. The visualized skeletal structures are Unremarkable.  LABS REVIEWED:   09-06-16: wbc 11.9; hgb 12.8; hct 39.8; mcv 86.5; plt 277; glucose 230; bun 33; creat 2.95; k+ 4.2; na++ 137; liver normal albumin 3.2; hgb a1c 9.0: urine culture: staphylococcus epidermis 09-08-16; wbc 10.5; hgb 11.8; hct 35.9; mcv 849; ;plt 251; glucose 140; bun 33; creat 2.54; k+ 4.4; na++ 139  09-22-16: INR 2.8:  09-29-16: INR 3.7  10-20-16: INR 2.2    Review of Systems  Constitutional: Negative for malaise/fatigue.  Respiratory: Negative for cough and shortness of breath.   Cardiovascular: Negative for chest pain, palpitations and leg swelling.  Gastrointestinal: Negative for abdominal pain, constipation and heartburn.  Musculoskeletal: Negative for back pain, joint pain and myalgias.  Skin: Negative.   Neurological: Negative for dizziness.  Psychiatric/Behavioral: The patient is not nervous/anxious.     Physical Exam  Constitutional: No distress.  Eyes: Conjunctivae are normal.  Neck: Neck supple. No JVD present. No thyromegaly present.  Cardiovascular: Normal rate, regular rhythm and intact distal pulses.  has murmur  Respiratory: Effort normal and breath sounds normal. No respiratory distress. He has no wheezes.  GI: Soft. Bowel sounds are normal. He exhibits no distension. There is no tenderness.  Musculoskeletal: He exhibits no edema.  Has right hemiplegia  Lymphadenopathy:    He has no cervical adenopathy.  Neurological: He is alert.  Skin: Skin is warm and dry. He is not diaphoretic.  Psychiatric: He has a normal mood and affect.     ASSESSMENT/ PLAN:  1. 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 afib:  heart rate is stable; will continue toprol xl 50 mg daily for rate control and is on long term coumadin therpay   5. Diabetes: hgb a1c 9.0: will continue lantus 35 units nightly and novolog 6 units with meals with an additional 5 units of cbg >150  6. Dyslipidemia: will continue pravachol 80 mg daily fenofibrate 160 mg daily   7. ckd stage IV: bun 33; creat 2.54     Neoma Laming  Abcde Oneil NP [facility]  Contact [phone] Monday through Friday 8am- 5pm  After hours call [phone]

## 2016-10-27 ENCOUNTER — Encounter: Payer: Self-pay | Admitting: Adult Health

## 2016-10-27 ENCOUNTER — Non-Acute Institutional Stay (SKILLED_NURSING_FACILITY): Payer: Medicare Other | Admitting: Adult Health

## 2016-10-27 DIAGNOSIS — Z7901 Long term (current) use of anticoagulants: Secondary | ICD-10-CM

## 2016-10-27 DIAGNOSIS — I480 Paroxysmal atrial fibrillation: Secondary | ICD-10-CM

## 2016-10-27 LAB — POCT INR: INR: 2.6 — AB (ref 0.9–1.1)

## 2016-10-27 LAB — PROTIME-INR: Protime: 27.5 seconds — AB (ref 10.0–13.8)

## 2016-10-27 NOTE — Progress Notes (Signed)
Location:   [place] Room Number: 158 B Place of Service:  SNF (31)   CODE STATUS:  DNR  Allergies  Allergen Reactions  . Ace Inhibitors Other (See Comments)    Renal failure    Chief Complaint  Patient presents with  . Acute Visit    INR Management    HPI:  He is on long term coumadin therapy for the management of his afib. There are no reports of missed doses. He is not voicing any complaints at this time,   Past Medical History:  Diagnosis Date  . CAD   . Cancer ([place])    skin  . CEREBROVASCULAR ACCIDENT   . CEREBROVASCULAR DISEASE   . DIABETES MELLITUS   . HYPERLIPIDEMIA   . HYPERTENSION   . MYOCARDIAL INFARCTION    x 2  . RENAL INSUFFICIENCY   . SUPRAVENTRICULAR TACHYCARDIA     Past Surgical History:  Procedure Laterality Date  . CORONARY STENT PLACEMENT  2002, 2003   x 4  . LOOP RECORDER IMPLANT N/A 10/27/2014   Procedure: LOOP RECORDER IMPLANT;  Surgeon: Deboraha Sprang, MD;  Location: [facility] CATH LAB;  Service: Cardiovascular;  Laterality: N/A;  . TEE WITHOUT CARDIOVERSION N/A 10/27/2014   Procedure: TRANSESOPHAGEAL ECHOCARDIOGRAM (TEE);  Surgeon: Dorothy Spark, MD;  Location: [facility] ENDOSCOPY;  Service: Cardiovascular;  Laterality: N/A;    Social History   Social History  . Marital status: Married    Spouse name: Gene  . Number of children: 0  . Years of education: [employer]   Occupational History  . Retired Other   Social History Main Topics  . Smoking status: Former Research scientist (life sciences)  . Smokeless tobacco: Never Used  . Alcohol use No  . Drug use: No  . Sexual activity: Not Currently   Other Topics Concern  . Not on file   Social History Narrative   Patient lives at home with spouse.   Caffiene: 8 to 12oz daily   Family History  Problem Relation Age of Onset  . Stroke Father       VITAL SIGNS BP (!) 160/79   Pulse 70   Temp 97.6 F (36.4 C)   Resp 20   Ht 5\' 5"  (1.651 m)   Wt 174 lb 12.8 oz (79.3 kg)   SpO2 94%   BMI 29.09  kg/m   Patient's Medications  New Prescriptions   No medications on file  Previous Medications   AMINO ACIDS-PROTEIN HYDROLYS (FEEDING SUPPLEMENT, PRO-STAT SUGAR FREE 64,) LIQD    Take 30 mLs by mouth 2 (two) times daily.   AMLODIPINE (NORVASC) 5 MG TABLET    Take 5 mg by mouth daily.   FENOFIBRATE 160 MG TABLET    Take 160 mg by mouth daily.   INSULIN ASPART (NOVOLOG) 100 UNIT/ML INJECTION    Inject into the skin 3 (three) times daily before meals. Inject as per sliding scale: if 0-150 = 0 units, 151+ = 5 units subcutaneously before meals.  Inject 6 units after meals.   INSULIN GLARGINE (LANTUS) 100 UNIT/ML INJECTION    Inject 35 Units into the skin at bedtime.   METOPROLOL (TOPROL-XL) 50 MG 24 HR TABLET    Take 50 mg by mouth daily.     PRAVASTATIN (PRAVACHOL) 80 MG TABLET    Take 80 mg by mouth daily.   PROMETHAZINE (PHENERGAN) 25 MG/ML INJECTION    Inject 25 mg into the muscle every 6 (six) hours as needed for nausea or  vomiting.   VITAMIN B-12 (CYANOCOBALAMIN) 100 MCG TABLET    Take 100 mcg by mouth daily.   WARFARIN (COUMADIN) 3 MG TABLET    Give 3.5 mg by mouth every evening   ZINC OXIDE 20 % OINTMENT    Apply 1 application topically daily. And every 2 hours  Modified Medications   No medications on file  Discontinued Medications   No medications on file     SIGNIFICANT DIAGNOSTIC EXAMS  09-06-16: chest x-ray: The heart size and mediastinal contours are within normal limits. Both lungs are clear. The visualized skeletal structures are Unremarkable.  LABS REVIEWED:   09-06-16: wbc 11.9; hgb 12.8; hct 39.8; mcv 86.5; plt 277; glucose 230; bun 33; creat 2.95; k+ 4.2; na++ 137; liver normal albumin 3.2; hgb a1c 9.0: urine culture: staphylococcus epidermis 09-08-16; wbc 10.5; hgb 11.8; hct 35.9; mcv 849; ;plt 251; glucose 140; bun 33; creat 2.54; k+ 4.4; na++ 139  09-22-16: INR 2.8:  09-29-16: INR 3.7  10-27-16: INR 2.6    Review of Systems  Constitutional: Negative for  malaise/fatigue.  Respiratory: Negative for cough and shortness of breath.   Cardiovascular: Negative for chest pain, palpitations and leg swelling.  Gastrointestinal: Negative for abdominal pain, constipation and heartburn.  Musculoskeletal: Negative for back pain, joint pain and myalgias.  Skin: Negative.   Neurological: Negative for dizziness.  Psychiatric/Behavioral: The patient is not nervous/anxious.     Physical Exam  Constitutional: No distress.  Eyes: Conjunctivae are normal.  Neck: Neck supple. No JVD present. No thyromegaly present.  Cardiovascular: Normal rate, regular rhythm and intact distal pulses.  has murmur  Respiratory: Effort normal and breath sounds normal. No respiratory distress. He has no wheezes.  GI: Soft. Bowel sounds are normal. He exhibits no distension. There is no tenderness.  Musculoskeletal: He exhibits no edema.  Has right hemiplegia  Lymphadenopathy:    He has no cervical adenopathy.  Neurological: He is alert.  Skin: Skin is warm and dry. He is not diaphoretic.  Psychiatric: He has a normal mood and affect.     ASSESSMENT/ PLAN:  1. afib Will continue coumadin 3.5 mg daily and will check inr in one week     MD is aware of resident's narcotic use and is in agreement with current plan of care. We will attempt to wean resident as apropriate     Ok Edwards NP [facility]  Contact [phone] Monday through Friday 8am- 5pm  After hours call [phone]

## 2016-11-04 LAB — PROTIME-INR: PROTIME: 25.4 s — AB (ref 10.0–13.8)

## 2016-11-04 LAB — POCT INR: INR: 2.3 — AB (ref 0.9–1.1)

## 2016-11-07 LAB — CUP PACEART REMOTE DEVICE CHECK
Date Time Interrogation Session: 20180223003659
MDC IDC PG IMPLANT DT: 20160304

## 2016-11-17 ENCOUNTER — Ambulatory Visit (INDEPENDENT_AMBULATORY_CARE_PROVIDER_SITE_OTHER): Payer: Medicare Other | Admitting: *Deleted

## 2016-11-17 DIAGNOSIS — I63412 Cerebral infarction due to embolism of left middle cerebral artery: Secondary | ICD-10-CM | POA: Diagnosis not present

## 2016-11-18 NOTE — Progress Notes (Signed)
Carelink Summary Report / Loop Recorder 

## 2016-11-20 ENCOUNTER — Telehealth: Payer: Self-pay | Admitting: *Deleted

## 2016-11-20 LAB — PROTIME-INR: PROTIME: 25.7 s — AB (ref 10.0–13.8)

## 2016-11-20 LAB — POCT INR: INR: 2.4 — AB (ref 0.9–1.1)

## 2016-11-20 NOTE — Telephone Encounter (Signed)
LMOVM requesting manual Carelink transmission.  LINQ at false RRT, requires software update.  [facility] phone number for questions or concerns.

## 2016-11-21 NOTE — Telephone Encounter (Signed)
Spoke w/ pt wife and informed her why pt needs to send a remote transmission. Informed her that the false reading will go away once the manual transmission is completed. Pt verbalized understanding.

## 2016-11-26 LAB — CUP PACEART REMOTE DEVICE CHECK
Date Time Interrogation Session: 20180325003756
Implantable Pulse Generator Implant Date: 20160304

## 2016-11-26 NOTE — Telephone Encounter (Signed)
LMOVM to follow-up on manual transmission for software update. [facility] phone number for questions or concerns.

## 2016-11-27 ENCOUNTER — Encounter: Payer: Self-pay | Admitting: Adult Health

## 2016-11-27 ENCOUNTER — Non-Acute Institutional Stay (SKILLED_NURSING_FACILITY): Payer: Medicare Other | Admitting: Adult Health

## 2016-11-27 DIAGNOSIS — N184 Chronic kidney disease, stage 4 (severe): Secondary | ICD-10-CM

## 2016-11-27 DIAGNOSIS — I480 Paroxysmal atrial fibrillation: Secondary | ICD-10-CM

## 2016-11-27 DIAGNOSIS — E1122 Type 2 diabetes mellitus with diabetic chronic kidney disease: Secondary | ICD-10-CM | POA: Diagnosis not present

## 2016-11-27 DIAGNOSIS — Z7901 Long term (current) use of anticoagulants: Secondary | ICD-10-CM

## 2016-11-27 DIAGNOSIS — Z794 Long term (current) use of insulin: Secondary | ICD-10-CM | POA: Diagnosis not present

## 2016-11-27 DIAGNOSIS — E785 Hyperlipidemia, unspecified: Secondary | ICD-10-CM

## 2016-11-27 DIAGNOSIS — I129 Hypertensive chronic kidney disease with stage 1 through stage 4 chronic kidney disease, or unspecified chronic kidney disease: Secondary | ICD-10-CM | POA: Diagnosis not present

## 2016-11-27 DIAGNOSIS — E1165 Type 2 diabetes mellitus with hyperglycemia: Secondary | ICD-10-CM

## 2016-11-27 DIAGNOSIS — I6350 Cerebral infarction due to unspecified occlusion or stenosis of unspecified cerebral artery: Secondary | ICD-10-CM | POA: Diagnosis not present

## 2016-11-27 DIAGNOSIS — G8191 Hemiplegia, unspecified affecting right dominant side: Secondary | ICD-10-CM

## 2016-11-27 DIAGNOSIS — E1169 Type 2 diabetes mellitus with other specified complication: Secondary | ICD-10-CM

## 2016-11-27 DIAGNOSIS — IMO0002 Reserved for concepts with insufficient information to code with codable children: Secondary | ICD-10-CM

## 2016-11-27 NOTE — Progress Notes (Signed)
Location:   [place] Room Number: [place] of Service:  SNF (31)   CODE STATUS: Full Code  Allergies  Allergen Reactions  . Ace Inhibitors Other (See Comments)    Renal failure    Chief Complaint  Patient presents with  . Medical Management of Chronic Issues    1 month follow up    HPI:  He is a long term resident of this facility being seen for the management of his chronic illnesses. He is telling me today that he is feeling good and has no complaints. There are no nursing concerns at this time. He was treated for an uti in March without complications.   Past Medical History:  Diagnosis Date  . CAD   . Cancer ([place])    skin  . CEREBROVASCULAR ACCIDENT   . CEREBROVASCULAR DISEASE   . DIABETES MELLITUS   . HYPERLIPIDEMIA   . HYPERTENSION   . MYOCARDIAL INFARCTION    x 2  . RENAL INSUFFICIENCY   . SUPRAVENTRICULAR TACHYCARDIA     Past Surgical History:  Procedure Laterality Date  . CORONARY STENT PLACEMENT  2002, 2003   x 4  . LOOP RECORDER IMPLANT N/A 10/27/2014   Procedure: LOOP RECORDER IMPLANT;  Surgeon: Deboraha Sprang, MD;  Location: [facility] CATH LAB;  Service: Cardiovascular;  Laterality: N/A;  . TEE WITHOUT CARDIOVERSION N/A 10/27/2014   Procedure: TRANSESOPHAGEAL ECHOCARDIOGRAM (TEE);  Surgeon: Dorothy Spark, MD;  Location: [facility] ENDOSCOPY;  Service: Cardiovascular;  Laterality: N/A;    Social History   Social History  . Marital status: Married    Spouse name: Gene  . Number of children: 0  . Years of education: [place]   Occupational History  . Retired Other   Social History Main Topics  . Smoking status: Former Research scientist (life sciences)  . Smokeless tobacco: Never Used  . Alcohol use No  . Drug use: No  . Sexual activity: Not Currently   Other Topics Concern  . Not on file   Social History Narrative   Patient lives at home with spouse.   Caffiene: 8 to 12oz daily   Family History  Problem Relation Age of Onset  . Stroke Father        VITAL SIGNS BP (!) 147/64   Pulse 73   Temp (!) 91 F (32.8 C)   Resp 18   Ht 5\' 4"  (1.626 m)   Wt 174 lb 9.6 oz (79.2 kg)   SpO2 96%   BMI 29.97 kg/m   Patient's Medications  New Prescriptions   No medications on file  Previous Medications   AMINO ACIDS-PROTEIN HYDROLYS (FEEDING SUPPLEMENT, PRO-STAT SUGAR FREE 64,) LIQD    Take 30 mLs by mouth 2 (two) times daily.   AMLODIPINE (NORVASC) 5 MG TABLET    Take 5 mg by mouth daily.   FENOFIBRATE 160 MG TABLET    Take 160 mg by mouth daily.   INSULIN ASPART (NOVOLOG) 100 UNIT/ML INJECTION    Inject into the skin 3 (three) times daily before meals. Inject as per sliding scale: if 0-150 = 0 units, 151+ = 5 units subcutaneously before meals.  Inject 6 units after meals.   INSULIN GLARGINE (LANTUS) 100 UNIT/ML INJECTION    Inject 35 Units into the skin at bedtime.   LORAZEPAM (ATIVAN) 0.5 MG TABLET    Take 0.5 mg by mouth every 12 (twelve) hours as needed for anxiety.   METOPROLOL (TOPROL-XL) 50 MG 24 HR TABLET  Take 50 mg by mouth daily.     PRAVASTATIN (PRAVACHOL) 80 MG TABLET    Take 80 mg by mouth daily.   PROMETHAZINE (PHENERGAN) 25 MG/ML INJECTION    Inject 25 mg into the muscle every 6 (six) hours as needed for nausea or vomiting.   VITAMIN B-12 (CYANOCOBALAMIN) 100 MCG TABLET    Take 100 mcg by mouth daily.   WARFARIN (COUMADIN) 3 MG TABLET    Give 3.5 mg by mouth every evening   ZINC OXIDE 20 % OINTMENT    Apply 1 application topically daily. And every 2 hours  Modified Medications   No medications on file  Discontinued Medications   No medications on file     SIGNIFICANT DIAGNOSTIC EXAMS  09-06-16: chest x-ray: The heart size and mediastinal contours are within normal limits. Both lungs are clear. The visualized skeletal structures are Unremarkable.  LABS REVIEWED:   09-06-16: wbc 11.9; hgb 12.8; hct 39.8; mcv 86.5; plt 277; glucose 230; bun 33; creat 2.95; k+ 4.2; na++ 137; liver normal albumin 3.2; hgb a1c 9.0:  urine culture: staphylococcus epidermis 09-08-16; wbc 10.5; hgb 11.8; hct 35.9; mcv 849; ;plt 251; glucose 140; bun 33; creat 2.54; k+ 4.4; na++ 139  09-22-16: INR 2.8:  09-29-16: INR 3.7  10-27-16: INR 2.6 11-10-16: urine culture: enterobacter cloacae: septra ds    Review of Systems  Constitutional: Negative for malaise/fatigue.  Respiratory: Negative for cough and shortness of breath.   Cardiovascular: Negative for chest pain, palpitations and leg swelling.  Gastrointestinal: Negative for abdominal pain, constipation and heartburn.  Musculoskeletal: Negative for back pain, joint pain and myalgias.  Skin: Negative.   Neurological: Negative for dizziness.  Psychiatric/Behavioral: The patient is not nervous/anxious.     Physical Exam  Constitutional: No distress.  Eyes: Conjunctivae are normal.  Neck: Neck supple. No JVD present. No thyromegaly present.  Cardiovascular: Normal rate, regular rhythm and intact distal pulses.  has murmur  Respiratory: Effort normal and breath sounds normal. No respiratory distress. He has no wheezes.  GI: Soft. Bowel sounds are normal. He exhibits no distension. There is no tenderness.  Musculoskeletal: He exhibits no edema.  Has right hemiplegia  Lymphadenopathy:    He has no cervical adenopathy.  Neurological: He is alert.  Skin: Skin is warm and dry. He is not diaphoretic.  Psychiatric: He has a normal mood and affect.     ASSESSMENT/ PLAN:   1. 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 afib: heart rate is stable; will continue toprol xl 50 mg daily for rate control and is on long term coumadin therpay   5. Diabetes: hgb a1c 9.0: will continue lantus 35 units nightly and novolog 6 units with meals with an additional 5 units of cbg >150  6.  Dyslipidemia: will continue pravachol 80 mg daily fenofibrate 160 mg daily   7. 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

## 2016-11-28 LAB — LIPID PANEL
Cholesterol: 130 mg/dL (ref 0–200)
HDL: 37 mg/dL (ref 35–70)
LDL CALC: 55 mg/dL
Triglycerides: 188 mg/dL — AB (ref 40–160)

## 2016-11-28 LAB — HEPATIC FUNCTION PANEL
ALT: 19 U/L (ref 10–40)
AST: 20 U/L (ref 14–40)
Alkaline Phosphatase: 57 U/L (ref 25–125)
Bilirubin, Total: 0.2 mg/dL

## 2016-11-28 LAB — HEMOGLOBIN A1C: Hemoglobin A1C: 9.4

## 2016-11-28 LAB — BASIC METABOLIC PANEL
BUN: 37 mg/dL — AB (ref 4–21)
Creatinine: 2.2 mg/dL — AB (ref 0.6–1.3)
GLUCOSE: 146 mg/dL
Potassium: 4.3 mmol/L (ref 3.4–5.3)
Sodium: 140 mmol/L (ref 137–147)

## 2016-11-28 LAB — CBC AND DIFFERENTIAL
HCT: 37 % — AB (ref 41–53)
Hemoglobin: 12.2 g/dL — AB (ref 13.5–17.5)
NEUTROS ABS: 6 /uL
PLATELETS: 278 10*3/uL (ref 150–399)
WBC: 10.3 10^3/mL

## 2016-11-28 NOTE — Telephone Encounter (Signed)
Manual transmission received.  Software successfully updated, LINQ battery status: good.

## 2016-11-28 NOTE — Telephone Encounter (Signed)
Spoke with [place], one of patient's caregivers at [employer].  Assisted her in sending a manual transmission from patient's home monitor to force a software update.  Advised that I will call her back if transmission is not received.  She verbalizes understanding and denies additional questions or concerns at this time.

## 2016-12-08 ENCOUNTER — Non-Acute Institutional Stay (SKILLED_NURSING_FACILITY): Payer: Medicare Other | Admitting: Adult Health

## 2016-12-08 ENCOUNTER — Encounter: Payer: Self-pay | Admitting: Adult Health

## 2016-12-08 DIAGNOSIS — I480 Paroxysmal atrial fibrillation: Secondary | ICD-10-CM | POA: Diagnosis not present

## 2016-12-08 DIAGNOSIS — Z7901 Long term (current) use of anticoagulants: Secondary | ICD-10-CM | POA: Diagnosis not present

## 2016-12-08 DIAGNOSIS — Z5181 Encounter for therapeutic drug level monitoring: Secondary | ICD-10-CM | POA: Diagnosis not present

## 2016-12-08 LAB — POCT INR: INR: 3.5 — AB (ref 0.9–1.1)

## 2016-12-08 LAB — PROTIME-INR: Protime: 35.4 seconds — AB (ref 10.0–13.8)

## 2016-12-08 NOTE — Progress Notes (Signed)
Location:   [place] Room Number: [place] of Service:  SNF (31)   CODE STATUS: Full code  Allergies  Allergen Reactions  . Ace Inhibitors Other (See Comments)    Renal failure    Chief Complaint  Patient presents with  . Acute Visit    Lab follow up    HPI:  His INR today is 3.5 his current dose is 2.5 mg coumadin daily. There are no reports of missed doses present. He is not voicing any complaints.    Past Medical History:  Diagnosis Date  . CAD   . Cancer ([place])    skin  . CEREBROVASCULAR ACCIDENT   . CEREBROVASCULAR DISEASE   . DIABETES MELLITUS   . HYPERLIPIDEMIA   . HYPERTENSION   . MYOCARDIAL INFARCTION    x 2  . RENAL INSUFFICIENCY   . SUPRAVENTRICULAR TACHYCARDIA     Past Surgical History:  Procedure Laterality Date  . CORONARY STENT PLACEMENT  2002, 2003   x 4  . LOOP RECORDER IMPLANT N/A 10/27/2014   Procedure: LOOP RECORDER IMPLANT;  Surgeon: Deboraha Sprang, MD;  Location: [facility] CATH LAB;  Service: Cardiovascular;  Laterality: N/A;  . TEE WITHOUT CARDIOVERSION N/A 10/27/2014   Procedure: TRANSESOPHAGEAL ECHOCARDIOGRAM (TEE);  Surgeon: Dorothy Spark, MD;  Location: [facility] ENDOSCOPY;  Service: Cardiovascular;  Laterality: N/A;    Social History   Social History  . Marital status: Married    Spouse name: Gene  . Number of children: 0  . Years of education: [place]   Occupational History  . Retired Other   Social History Main Topics  . Smoking status: Former Research scientist (life sciences)  . Smokeless tobacco: Never Used  . Alcohol use No  . Drug use: No  . Sexual activity: Not Currently   Other Topics Concern  . Not on file   Social History Narrative   Patient lives at home with spouse.   Caffiene: 8 to 12oz daily   Family History  Problem Relation Age of Onset  . Stroke Father       VITAL SIGNS BP 134/61   Pulse 78   Temp 97.9 F (36.6 C)   Resp 18   Ht 5\' 4"  (1.626 m)   Wt 174 lb (78.9 kg)   SpO2 97%   BMI 29.87 kg/m    Patient's Medications  New Prescriptions   No medications on file  Previous Medications   AMINO ACIDS-PROTEIN HYDROLYS (FEEDING SUPPLEMENT, PRO-STAT SUGAR FREE 64,) LIQD    Take 30 mLs by mouth 2 (two) times daily.   AMLODIPINE (NORVASC) 5 MG TABLET    Take 5 mg by mouth daily.   ARIPIPRAZOLE (ABILIFY) 5 MG TABLET    Take 2.5 mg by mouth daily.   FENOFIBRATE 160 MG TABLET    Take 160 mg by mouth daily.   INSULIN ASPART (NOVOLOG) 100 UNIT/ML INJECTION    Inject into the skin 3 (three) times daily before meals. Inject as per sliding scale: if 0-150 = 0 units, 151+ = 5 units subcutaneously before meals.  Inject 6 units after meals.   INSULIN GLARGINE (LANTUS) 100 UNIT/ML INJECTION    Inject 35 Units into the skin at bedtime.   LORAZEPAM (ATIVAN) 0.5 MG TABLET    Take 0.5 mg by mouth every 12 (twelve) hours as needed for anxiety.   METOPROLOL (TOPROL-XL) 50 MG 24 HR TABLET    Take 50 mg by mouth daily.     PRAVASTATIN (PRAVACHOL) 80  MG TABLET    Take 80 mg by mouth daily.   PROMETHAZINE (PHENERGAN) 25 MG/ML INJECTION    Inject 25 mg into the muscle every 6 (six) hours as needed for nausea or vomiting.   VITAMIN B-12 (CYANOCOBALAMIN) 100 MCG TABLET    Take 100 mcg by mouth daily.   WARFARIN (COUMADIN) 3 MG TABLET    Give 3.5 mg by mouth every evening   ZINC OXIDE 20 % OINTMENT    Apply 1 application topically daily. And every 2 hours  Modified Medications   No medications on file  Discontinued Medications   No medications on file     SIGNIFICANT DIAGNOSTIC EXAMS  09-06-16: chest x-ray: The heart size and mediastinal contours are within normal limits. Both lungs are clear. The visualized skeletal structures are Unremarkable.  LABS REVIEWED:   09-06-16: wbc 11.9; hgb 12.8; hct 39.8; mcv 86.5; plt 277; glucose 230; bun 33; creat 2.95; k+ 4.2; na++ 137; liver normal albumin 3.2; hgb a1c 9.0: urine culture: staphylococcus epidermis 09-08-16; wbc 10.5; hgb 11.8; hct 35.9; mcv 849; ;plt 251;  glucose 140; bun 33; creat 2.54; k+ 4.4; na++ 139  09-22-16: INR 2.8:  09-29-16: INR 3.7  10-27-16: INR 2.6 11-10-16: urine culture: enterobacter cloacae: septra ds  12-08-16: INR 3.5     Review of Systems  Constitutional: Negative for malaise/fatigue.  Respiratory: Negative for cough and shortness of breath.   Cardiovascular: Negative for chest pain, palpitations and leg swelling.  Gastrointestinal: Negative for abdominal pain, constipation and heartburn.  Musculoskeletal: Negative for back pain, joint pain and myalgias.  Skin: Negative.   Neurological: Negative for dizziness.  Psychiatric/Behavioral: The patient is not nervous/anxious.     Physical Exam  Constitutional: No distress.  Eyes: Conjunctivae are normal.  Neck: Neck supple. No JVD present. No thyromegaly present.  Cardiovascular: Normal rate, regular rhythm and intact distal pulses.  has murmur  Respiratory: Effort normal and breath sounds normal. No respiratory distress. He has no wheezes.  GI: Soft. Bowel sounds are normal. He exhibits no distension. There is no tenderness.  Musculoskeletal: He exhibits no edema.  Has right hemiplegia  Lymphadenopathy:    He has no cervical adenopathy.  Neurological: He is alert.  Skin: Skin is warm and dry. He is not diaphoretic.  Psychiatric: He has a normal mood and affect.     ASSESSMENT/ PLAN:  1. afib: heart rate is stable; will continue toprol xl 50 mg daily for rate control and is on long term coumadin therpay   2. Anticoagulation management: for his inr 3.5 will change his coumadin to 2.5 mg daily and will check in one week.    MD is aware of resident's narcotic use and is in agreement with current plan of care. We will attempt to wean resident as apropriate     Ok Edwards NP [facility]  Contact [phone] Monday through Friday 8am- 5pm  After hours call [phone]

## 2016-12-15 ENCOUNTER — Ambulatory Visit (INDEPENDENT_AMBULATORY_CARE_PROVIDER_SITE_OTHER): Payer: Medicare Other | Admitting: *Deleted

## 2016-12-15 DIAGNOSIS — I63412 Cerebral infarction due to embolism of left middle cerebral artery: Secondary | ICD-10-CM | POA: Diagnosis not present

## 2016-12-15 LAB — POCT INR: INR: 1.5 — AB (ref 0.9–1.1)

## 2016-12-15 LAB — PROTIME-INR: Protime: 17.5 seconds — AB (ref 10.0–13.8)

## 2016-12-16 NOTE — Progress Notes (Signed)
Carelink Summary Report / Loop Recorder 

## 2016-12-22 ENCOUNTER — Encounter: Payer: Self-pay | Admitting: Adult Health

## 2016-12-22 ENCOUNTER — Non-Acute Institutional Stay (SKILLED_NURSING_FACILITY): Payer: Medicare Other | Admitting: Adult Health

## 2016-12-22 DIAGNOSIS — Z5181 Encounter for therapeutic drug level monitoring: Secondary | ICD-10-CM

## 2016-12-22 DIAGNOSIS — I480 Paroxysmal atrial fibrillation: Secondary | ICD-10-CM

## 2016-12-22 DIAGNOSIS — Z7901 Long term (current) use of anticoagulants: Secondary | ICD-10-CM

## 2016-12-22 LAB — PROTIME-INR: PROTIME: 23.5 s — AB (ref 10.0–13.8)

## 2016-12-22 LAB — POCT INR: INR: 2.1 — AB (ref 0.9–1.1)

## 2016-12-22 NOTE — Progress Notes (Signed)
Location:   [place] Room Number: [place] of Service:  SNF (31)   CODE STATUS: Full Code  Allergies  Allergen Reactions  . Ace Inhibitors Other (See Comments)    Renal failure    Chief Complaint  Patient presents with  . Acute Visit    INR Management    HPI:  His inr today is 2.1 and is taking coumadin 3 mg daily. There are no reports of any missed doses. He tells me that he is feeling good. There are no nursing concerns today.    Past Medical History:  Diagnosis Date  . CAD   . Cancer ([place])    skin  . CEREBROVASCULAR ACCIDENT   . CEREBROVASCULAR DISEASE   . DIABETES MELLITUS   . HYPERLIPIDEMIA   . HYPERTENSION   . MYOCARDIAL INFARCTION    x 2  . RENAL INSUFFICIENCY   . SUPRAVENTRICULAR TACHYCARDIA     Past Surgical History:  Procedure Laterality Date  . CORONARY STENT PLACEMENT  2002, 2003   x 4  . LOOP RECORDER IMPLANT N/A 10/27/2014   Procedure: LOOP RECORDER IMPLANT;  Surgeon: Deboraha Sprang, MD;  Location: [facility] CATH LAB;  Service: Cardiovascular;  Laterality: N/A;  . TEE WITHOUT CARDIOVERSION N/A 10/27/2014   Procedure: TRANSESOPHAGEAL ECHOCARDIOGRAM (TEE);  Surgeon: Dorothy Spark, MD;  Location: [facility] ENDOSCOPY;  Service: Cardiovascular;  Laterality: N/A;    Social History   Social History  . Marital status: Married    Spouse name: Gene  . Number of children: 0  . Years of education: [place]   Occupational History  . Retired Other   Social History Main Topics  . Smoking status: Former Research scientist (life sciences)  . Smokeless tobacco: Never Used  . Alcohol use No  . Drug use: No  . Sexual activity: Not Currently   Other Topics Concern  . Not on file   Social History Narrative   Patient lives at home with spouse.   Caffiene: 8 to 12oz daily   Family History  Problem Relation Age of Onset  . Stroke Father       VITAL SIGNS BP 131/84   Pulse 69   Temp 97.9 F (36.6 C)   Resp 19   Ht 5\' 4"  (1.626 m)   Wt 174 lb (78.9 kg)   SpO2 91%    BMI 29.87 kg/m   Patient's Medications  New Prescriptions   No medications on file  Previous Medications   AMINO ACIDS-PROTEIN HYDROLYS (FEEDING SUPPLEMENT, PRO-STAT SUGAR FREE 64,) LIQD    Take 30 mLs by mouth 2 (two) times daily.   AMLODIPINE (NORVASC) 5 MG TABLET    Take 5 mg by mouth daily.   FENOFIBRATE 160 MG TABLET    Take 160 mg by mouth daily.   INSULIN GLARGINE (LANTUS) 100 UNIT/ML INJECTION    Inject 35 Units into the skin at bedtime.   INSULIN LISPRO (HUMALOG) 100 UNIT/ML INJECTION    Inject 5 units subcutaneously before meals for blood glucose reading >150. Give 6 units subcutaneously after meals   METOPROLOL (TOPROL-XL) 50 MG 24 HR TABLET    Take 50 mg by mouth daily.     PRAVASTATIN (PRAVACHOL) 80 MG TABLET    Take 80 mg by mouth daily.   PROMETHAZINE (PHENERGAN) 25 MG/ML INJECTION    Inject 25 mg into the muscle every 6 (six) hours as needed for nausea or vomiting.   SERTRALINE (ZOLOFT) 50 MG TABLET    Take 50  mg by mouth daily.   VITAMIN B-12 (CYANOCOBALAMIN) 100 MCG TABLET    Take 100 mcg by mouth daily.   WARFARIN (COUMADIN) 3 MG TABLET    Give 3mg  by mouth every evening   ZINC OXIDE 20 % OINTMENT    Apply 1 application topically daily. And every 2 hours  Modified Medications   No medications on file  Discontinued Medications     SIGNIFICANT DIAGNOSTIC EXAMS  09-06-16: chest x-ray: The heart size and mediastinal contours are within normal limits. Both lungs are clear. The visualized skeletal structures are Unremarkable.  LABS REVIEWED:   09-06-16: wbc 11.9; hgb 12.8; hct 39.8; mcv 86.5; plt 277; glucose 230; bun 33; creat 2.95; k+ 4.2; na++ 137; liver normal albumin 3.2; hgb a1c 9.0: urine culture: staphylococcus epidermis 09-08-16; wbc 10.5; hgb 11.8; hct 35.9; mcv 849; ;plt 251; glucose 140; bun 33; creat 2.54; k+ 4.4; na++ 139  09-22-16: INR 2.8:  09-29-16: INR 3.7  10-27-16: INR 2.6 11-10-16: urine culture: enterobacter cloacae: septra ds  12-08-16: INR 3.5    12-22-16: INR 2.1     Review of Systems  Constitutional: Negative for malaise/fatigue.  Respiratory: Negative for cough and shortness of breath.   Cardiovascular: Negative for chest pain, palpitations and leg swelling.  Gastrointestinal: Negative for abdominal pain, constipation and heartburn.  Musculoskeletal: Negative for back pain, joint pain and myalgias.  Skin: Negative.   Neurological: Negative for dizziness.  Psychiatric/Behavioral: The patient is not nervous/anxious.     Physical Exam  Constitutional: No distress.  Eyes: Conjunctivae are normal.  Neck: Neck supple. No JVD present. No thyromegaly present.  Cardiovascular: -Normal rate, regular rhythm and intact distal pulses.  has murmur  Respiratory: Effort normal and breath sounds normal. No respiratory distress. He has no wheezes.  GI: Soft. Bowel sounds are normal. He exhibits no distension. There is no tenderness.  Musculoskeletal: He exhibits no edema.  Has right hemiplegia  Lymphadenopathy:    He has no cervical adenopathy.  Neurological: He is alert.  Skin: Skin is warm and dry. He is not diaphoretic.  Psychiatric: He has a normal mood and affect.     ASSESSMENT/ PLAN:  1. afib: heart rate is stable; will continue toprol xl 50 mg daily for rate control and is on long term coumadin therpay   2. Anticoagulation management: for his inr 2.1  will continue coumadin 3 mg daily  and will check in one week.      Ok Edwards NP [facility]  Contact [phone] Monday through Friday 8am- 5pm  After hours call [phone]

## 2016-12-29 ENCOUNTER — Non-Acute Institutional Stay (SKILLED_NURSING_FACILITY): Payer: Medicare Other | Admitting: Adult Health

## 2016-12-29 ENCOUNTER — Encounter: Payer: Self-pay | Admitting: Adult Health

## 2016-12-29 DIAGNOSIS — N184 Chronic kidney disease, stage 4 (severe): Secondary | ICD-10-CM | POA: Diagnosis not present

## 2016-12-29 DIAGNOSIS — E785 Hyperlipidemia, unspecified: Secondary | ICD-10-CM | POA: Diagnosis not present

## 2016-12-29 DIAGNOSIS — I129 Hypertensive chronic kidney disease with stage 1 through stage 4 chronic kidney disease, or unspecified chronic kidney disease: Secondary | ICD-10-CM | POA: Diagnosis not present

## 2016-12-29 DIAGNOSIS — E1165 Type 2 diabetes mellitus with hyperglycemia: Secondary | ICD-10-CM | POA: Diagnosis not present

## 2016-12-29 DIAGNOSIS — I480 Paroxysmal atrial fibrillation: Secondary | ICD-10-CM | POA: Diagnosis not present

## 2016-12-29 DIAGNOSIS — I6350 Cerebral infarction due to unspecified occlusion or stenosis of unspecified cerebral artery: Secondary | ICD-10-CM

## 2016-12-29 DIAGNOSIS — E1169 Type 2 diabetes mellitus with other specified complication: Secondary | ICD-10-CM

## 2016-12-29 DIAGNOSIS — E1122 Type 2 diabetes mellitus with diabetic chronic kidney disease: Secondary | ICD-10-CM

## 2016-12-29 DIAGNOSIS — Z794 Long term (current) use of insulin: Secondary | ICD-10-CM

## 2016-12-29 DIAGNOSIS — IMO0002 Reserved for concepts with insufficient information to code with codable children: Secondary | ICD-10-CM

## 2016-12-29 NOTE — Progress Notes (Signed)
Location:   [place] Room Number: [place] of Service:  SNF (31)   CODE STATUS: Full Code  Allergies  Allergen Reactions  . Ace Inhibitors Other (See Comments)    Renal failure    Chief Complaint  Patient presents with  . Medical Management of Chronic Issues    1 month follow up    HPI:  He is a long term resident of this facility being seen for the management of his chronic illnesses. Overall his status is stable. He tells me that he is feeling good; and has no complaints. There are no nursing concerns at this time.    Past Medical History:  Diagnosis Date  . CAD   . Cancer ([place])    skin  . CEREBROVASCULAR ACCIDENT   . CEREBROVASCULAR DISEASE   . DIABETES MELLITUS   . HYPERLIPIDEMIA   . HYPERTENSION   . MYOCARDIAL INFARCTION    x 2  . RENAL INSUFFICIENCY   . SUPRAVENTRICULAR TACHYCARDIA     Past Surgical History:  Procedure Laterality Date  . CORONARY STENT PLACEMENT  2002, 2003   x 4  . LOOP RECORDER IMPLANT N/A 10/27/2014   Procedure: LOOP RECORDER IMPLANT;  Surgeon: Deboraha Sprang, MD;  Location: [facility] CATH LAB;  Service: Cardiovascular;  Laterality: N/A;  . TEE WITHOUT CARDIOVERSION N/A 10/27/2014   Procedure: TRANSESOPHAGEAL ECHOCARDIOGRAM (TEE);  Surgeon: Dorothy Spark, MD;  Location: [facility] ENDOSCOPY;  Service: Cardiovascular;  Laterality: N/A;    Social History   Social History  . Marital status: Married    Spouse name: Gene  . Number of children: 0  . Years of education: [place]   Occupational History  . Retired Other   Social History Main Topics  . Smoking status: Former Research scientist (life sciences)  . Smokeless tobacco: Never Used  . Alcohol use No  . Drug use: No  . Sexual activity: Not Currently   Other Topics Concern  . Not on file   Social History Narrative   Patient lives at home with spouse.   Caffiene: 8 to 12oz daily   Family History  Problem Relation Age of Onset  . Stroke Father       VITAL SIGNS BP (!) 117/49   Pulse 64    Temp 98.4 F (36.9 C)   Resp 18   Ht 5\' 4"  (1.626 m)   Wt 177 lb (80.3 kg)   SpO2 97%   BMI 30.38 kg/m   Patient's Medications  New Prescriptions   No medications on file  Previous Medications   AMINO ACIDS-PROTEIN HYDROLYS (FEEDING SUPPLEMENT, PRO-STAT SUGAR FREE 64,) LIQD    Take 30 mLs by mouth 2 (two) times daily.   AMLODIPINE (NORVASC) 5 MG TABLET    Take 5 mg by mouth daily.   FENOFIBRATE 160 MG TABLET    Take 160 mg by mouth daily.   INSULIN GLARGINE (LANTUS) 100 UNIT/ML INJECTION    Inject 35 Units into the skin at bedtime.   INSULIN LISPRO (HUMALOG) 100 UNIT/ML INJECTION    Inject 6 units subcutaneously before meals for blood glucose reading >150. Give 5 units subcutaneously after meals   LORAZEPAM (ATIVAN) 0.5 MG TABLET    Take 0.5 mg by mouth every 12 (twelve) hours as needed for anxiety.   METOPROLOL (TOPROL-XL) 50 MG 24 HR TABLET    Take 50 mg by mouth daily.     PRAVASTATIN (PRAVACHOL) 80 MG TABLET    Take 80 mg by mouth daily.  PROMETHAZINE (PHENERGAN) 25 MG/ML INJECTION    Inject 25 mg into the muscle every 6 (six) hours as needed for nausea or vomiting.   SERTRALINE (ZOLOFT) 50 MG TABLET    Take 50 mg by mouth daily.   VITAMIN B-12 (CYANOCOBALAMIN) 100 MCG TABLET    Take 100 mcg by mouth daily.   WARFARIN (COUMADIN) 3 MG TABLET    Give 3mg  by mouth every evening   ZINC OXIDE 20 % OINTMENT    Apply 1 application topically daily. And every 2 hours  Modified Medications   No medications on file  Discontinued Medications   No medications on file     SIGNIFICANT DIAGNOSTIC EXAMS  09-06-16: chest x-ray: The heart size and mediastinal contours are within normal limits. Both lungs are clear. The visualized skeletal structures are Unremarkable.  LABS REVIEWED:   09-06-16: wbc 11.9; hgb 12.8; hct 39.8; mcv 86.5; plt 277; glucose 230; bun 33; creat 2.95; k+ 4.2; na++ 137; liver normal albumin 3.2; hgb a1c 9.0: urine culture: staphylococcus epidermis 09-08-16; wbc 10.5;  hgb 11.8; hct 35.9; mcv 849; ;plt 251; glucose 140; bun 33; creat 2.54; k+ 4.4; na++ 139  09-22-16: INR 2.8:  09-29-16: INR 3.7  10-27-16: INR 2.6 11-10-16: urine culture: enterobacter cloacae: septra ds  4-11-8: urine micro-albumin: 6.0  12-08-16: INR 3.5  12-22-16: INR 2.1     Review of Systems  Constitutional: Negative for malaise/fatigue.  Respiratory: Negative for cough and shortness of breath.   Cardiovascular: Negative for chest pain, palpitations and leg swelling.  Gastrointestinal: Negative for abdominal pain, constipation and heartburn.  Musculoskeletal: Negative for back pain, joint pain and myalgias.  Skin: Negative.   Neurological: Negative for dizziness.  Psychiatric/Behavioral: The patient is not nervous/anxious.     Physical Exam  Constitutional: No distress.  Eyes: Conjunctivae are normal.  Neck: Neck supple. No JVD present. No thyromegaly present.  Cardiovascular: -Normal rate, regular rhythm and intact distal pulses.  has murmur  Respiratory: Effort normal and breath sounds normal. No respiratory distress. He has no wheezes.  GI: Soft. Bowel sounds are normal. He exhibits no distension. There is no tenderness.  Musculoskeletal: He exhibits no edema.  Has right hemiplegia  Lymphadenopathy:    He has no cervical adenopathy.  Neurological: He is alert.  Skin: Skin is warm and dry. He is not diaphoretic.  Psychiatric: He has a normal mood and affect.     ASSESSMENT/ PLAN:  1. 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 afib: heart rate is stable; will continue toprol xl 50 mg daily for rate control and is on long term coumadin therpay   5. Diabetes: hgb a1c 9.0:  His cbg's are elevated: will increase lantus to 40 units nightly and will  increase humalog to 10 units after meals   6. Dyslipidemia: will continue pravachol 80 mg daily fenofibrate 160 mg daily   7. ckd stage IV: bun 33; creat 2.54  Will check cbc; cmp; lipids hgb a1c     MD is aware of resident's narcotic use and is in agreement with current plan of care. We will attempt to wean resident as apropriate    Ok Edwards NP [facility] Adult Medicine  Contact [phone] Monday through Friday  8am- 5pm  After hours call [phone]

## 2016-12-30 LAB — CUP PACEART REMOTE DEVICE CHECK
MDC IDC PG IMPLANT DT: 20160304
MDC IDC SESS DTM: 20180424013646

## 2017-01-02 LAB — LIPID PANEL
Cholesterol: 127 mg/dL (ref 0–200)
HDL: 36 mg/dL (ref 35–70)
LDL CALC: 64 mg/dL
TRIGLYCERIDES: 134 mg/dL (ref 40–160)

## 2017-01-02 LAB — HEPATIC FUNCTION PANEL
ALK PHOS: 46 U/L (ref 25–125)
ALT: 10 U/L (ref 10–40)
AST: 14 U/L (ref 14–40)
Bilirubin, Total: 0.2 mg/dL

## 2017-01-02 LAB — CBC AND DIFFERENTIAL
HEMATOCRIT: 35 % — AB (ref 41–53)
Hemoglobin: 11.3 g/dL — AB (ref 13.5–17.5)
NEUTROS ABS: 10 /uL
Platelets: 290 10*3/uL (ref 150–399)
WBC: 14.5 10^3/mL

## 2017-01-02 LAB — BASIC METABOLIC PANEL
BUN: 42 mg/dL — AB (ref 4–21)
Creatinine: 2.7 mg/dL — AB (ref 0.6–1.3)
GLUCOSE: 57 mg/dL
Potassium: 4.1 mmol/L (ref 3.4–5.3)
SODIUM: 145 mmol/L (ref 137–147)

## 2017-01-02 LAB — HEMOGLOBIN A1C: Hemoglobin A1C: 8

## 2017-01-14 ENCOUNTER — Ambulatory Visit (INDEPENDENT_AMBULATORY_CARE_PROVIDER_SITE_OTHER): Payer: Medicare Other | Admitting: *Deleted

## 2017-01-14 DIAGNOSIS — I63412 Cerebral infarction due to embolism of left middle cerebral artery: Secondary | ICD-10-CM

## 2017-01-15 NOTE — Progress Notes (Signed)
Carelink Summary Report / Loop Recorder 

## 2017-01-16 LAB — CUP PACEART REMOTE DEVICE CHECK
MDC IDC PG IMPLANT DT: 20160304
MDC IDC SESS DTM: 20180524014342

## 2017-01-29 ENCOUNTER — Non-Acute Institutional Stay (SKILLED_NURSING_FACILITY): Payer: Medicare Other | Admitting: Adult Health

## 2017-01-29 ENCOUNTER — Encounter: Payer: Self-pay | Admitting: Adult Health

## 2017-01-29 DIAGNOSIS — F418 Other specified anxiety disorders: Secondary | ICD-10-CM

## 2017-01-29 DIAGNOSIS — I6350 Cerebral infarction due to unspecified occlusion or stenosis of unspecified cerebral artery: Secondary | ICD-10-CM | POA: Diagnosis not present

## 2017-01-29 DIAGNOSIS — I480 Paroxysmal atrial fibrillation: Secondary | ICD-10-CM | POA: Diagnosis not present

## 2017-01-29 DIAGNOSIS — E785 Hyperlipidemia, unspecified: Secondary | ICD-10-CM | POA: Diagnosis not present

## 2017-01-29 DIAGNOSIS — I129 Hypertensive chronic kidney disease with stage 1 through stage 4 chronic kidney disease, or unspecified chronic kidney disease: Secondary | ICD-10-CM

## 2017-01-29 DIAGNOSIS — IMO0002 Reserved for concepts with insufficient information to code with codable children: Secondary | ICD-10-CM

## 2017-01-29 DIAGNOSIS — E1165 Type 2 diabetes mellitus with hyperglycemia: Secondary | ICD-10-CM

## 2017-01-29 DIAGNOSIS — E1169 Type 2 diabetes mellitus with other specified complication: Secondary | ICD-10-CM

## 2017-01-29 DIAGNOSIS — Z794 Long term (current) use of insulin: Secondary | ICD-10-CM | POA: Diagnosis not present

## 2017-01-29 DIAGNOSIS — N184 Chronic kidney disease, stage 4 (severe): Secondary | ICD-10-CM

## 2017-01-29 DIAGNOSIS — E1122 Type 2 diabetes mellitus with diabetic chronic kidney disease: Secondary | ICD-10-CM

## 2017-01-29 NOTE — Progress Notes (Signed)
Location:   [place] Room Number: [address] Place of Service:  SNF (31)   CODE STATUS: Full Code  Allergies  Allergen Reactions  . Ace Inhibitors Other (See Comments)    Renal failure    Chief Complaint  Patient presents with  . Medical Management of Chronic Issues    1 month follow up    HPI:  He is a long term resident of this facility being seen for the management of his chronic illnesses. Staff reports that he is more irritable with staff. Today he seems standoffish. He would answer questions yes/no only.    Past Medical History:  Diagnosis Date  . CAD   . Cancer ([place])    skin  . CEREBROVASCULAR ACCIDENT   . CEREBROVASCULAR DISEASE   . DIABETES MELLITUS   . HYPERLIPIDEMIA   . HYPERTENSION   . MYOCARDIAL INFARCTION    x 2  . RENAL INSUFFICIENCY   . SUPRAVENTRICULAR TACHYCARDIA     Past Surgical History:  Procedure Laterality Date  . CORONARY STENT PLACEMENT  2002, 2003   x 4  . LOOP RECORDER IMPLANT N/A 10/27/2014   Procedure: LOOP RECORDER IMPLANT;  Surgeon: Deboraha Sprang, MD;  Location: [facility] CATH LAB;  Service: Cardiovascular;  Laterality: N/A;  . TEE WITHOUT CARDIOVERSION N/A 10/27/2014   Procedure: TRANSESOPHAGEAL ECHOCARDIOGRAM (TEE);  Surgeon: Dorothy Spark, MD;  Location: [facility] ENDOSCOPY;  Service: Cardiovascular;  Laterality: N/A;    Social History   Social History  . Marital status: Married    Spouse name: Gene  . Number of children: 0  . Years of education: [employer]   Occupational History  . Retired Other   Social History Main Topics  . Smoking status: Former Research scientist (life sciences)  . Smokeless tobacco: Never Used  . Alcohol use No  . Drug use: No  . Sexual activity: Not Currently   Other Topics Concern  . Not on file   Social History Narrative   Patient lives at home with spouse.   Caffiene: 8 to 12oz daily   Family History  Problem Relation Age of Onset  . Stroke Father       VITAL SIGNS BP 122/74   Pulse 78   Temp 98.2 F  (36.8 C)   Resp 19   Ht 5\' 5"  (1.651 m)   Wt 177 lb (80.3 kg)   PF 94 L/min   BMI 29.45 kg/m   Patient's Medications  New Prescriptions   No medications on file  Previous Medications   AMINO ACIDS-PROTEIN HYDROLYS (FEEDING SUPPLEMENT, PRO-STAT SUGAR FREE 64,) LIQD    Take 30 mLs by mouth 2 (two) times daily.   AMLODIPINE (NORVASC) 5 MG TABLET    Take 5 mg by mouth daily.   FENOFIBRATE 160 MG TABLET    Take 160 mg by mouth daily.   INSULIN GLARGINE (LANTUS) 100 UNIT/ML INJECTION    Inject 40 Units into the skin at bedtime.    INSULIN LISPRO (HUMALOG) 100 UNIT/ML INJECTION    Inject as per sliding scale:  0-150 = 0 units 151 + = 10 units   METOPROLOL (TOPROL-XL) 50 MG 24 HR TABLET    Take 50 mg by mouth daily.     PRAVASTATIN (PRAVACHOL) 80 MG TABLET    Take 80 mg by mouth daily.   PROMETHAZINE (PHENERGAN) 25 MG/ML INJECTION    Inject 25 mg into the muscle every 6 (six) hours as needed for nausea or vomiting.   SERTRALINE (ZOLOFT)  50 MG TABLET    Take 50 mg by mouth daily.   VITAMIN B-12 (CYANOCOBALAMIN) 100 MCG TABLET    Take 100 mcg by mouth daily.   WARFARIN (COUMADIN) 3 MG TABLET    Give 3mg  by mouth every evening   ZINC OXIDE 20 % OINTMENT    Apply 1 application topically daily. And every 2 hours  Modified Medications   No medications on file  Discontinued Medications   LORAZEPAM (ATIVAN) 0.5 MG TABLET    Take 0.5 mg by mouth every 12 (twelve) hours as needed for anxiety.     SIGNIFICANT DIAGNOSTIC EXAMS  09-06-16: chest x-ray: The heart size and mediastinal contours are within normal limits. Both lungs are clear. The visualized skeletal structures are Unremarkable.  LABS REVIEWED:   09-06-16: wbc 11.9; hgb 12.8; hct 39.8; mcv 86.5; plt 277; glucose 230; bun 33; creat 2.95; k+ 4.2; na++ 137; liver normal albumin 3.2; hgb a1c 9.0: urine culture: staphylococcus epidermis 09-08-16; wbc 10.5; hgb 11.8; hct 35.9; mcv 849; ;plt 251; glucose 140; bun 33; creat 2.54; k+ 4.4; na++  139  09-22-16: INR 2.8:  09-29-16: INR 3.7  10-27-16: INR 2.6 11-10-16: urine culture: enterobacter cloacae: septra ds  4-11-8: urine micro-albumin: 6.0  12-08-16: INR 3.5  12-22-16: INR 2.1 01-02-17: wbc 14.5; hgb 11.3; hct 35.2 ;mcv 87.4; plt 290glucose 57; bun 42.0; creat 2.69; k+ 4.1 ;na++ 145; ca 9.1; liver normal albumin 3.6;  chol 127; ldl 64; trig 134; hdl 36  01-19-17: INR 2.8     Review of Systems  Constitutional: Negative for malaise  Respiratory: Negative for cough    Cardiovascular: Negative for chest pain,  and leg swelling.  Gastrointestinal: Negative for abdominal pain,  Musculoskeletal: Negative for back pain,  Skin: Negative.   Neurological: Negative for dizziness.  Psychiatric/Behavioral: The patient is not nervous/anxious.     Physical Exam  Constitutional: No distress.  Eyes: Conjunctivae are normal.  Neck: Neck supple. No JVD present. No thyromegaly present.  Cardiovascular: -Normal rate, regular rhythm and intact distal pulses.  has murmur  Respiratory: Effort normal and breath sounds normal. No respiratory distress. He has no wheezes.  GI: Soft. Bowel sounds are normal. He exhibits no distension. There is no tenderness.  Musculoskeletal: He exhibits no edema.  Has right hemiplegia  Lymphadenopathy:    He has no cervical adenopathy.  Neurological: He is alert.  Skin: Skin is warm and dry. He is not diaphoretic.  Psychiatric: He has a normal mood and affect.     ASSESSMENT/ PLAN:  1. Hypertension: b/p 122/74  will continue toprol xl 50 mg daily and norvasc 5 mg daily   2. 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 afib: heart rate is stable; will continue toprol xl 50 mg daily for rate control and is on long term coumadin therpay   5. Diabetes: hgb a1c 9.0:  His cbg's are elevated: will increase  lantus to 43 units nightly and will continue humalog  10 units after meals   6. Dyslipidemia: ldl 64; trig 134 : will continue pravachol 80 mg daily fenofibrate 160 mg daily   7. ckd stage IV: bun 42 ; creat 2.69  8. Depression with anxiety: will continue zoloft 50 mg daily    Will check hgb a1c    Neoma Laming  Jolynne Spurgin NP [facility] Adult Medicine  Contact [phone] Monday through Friday 8am- 5pm  After hours call [phone]

## 2017-02-13 ENCOUNTER — Other Ambulatory Visit: Payer: Self-pay | Admitting: Internal Medicine

## 2017-02-13 ENCOUNTER — Ambulatory Visit (INDEPENDENT_AMBULATORY_CARE_PROVIDER_SITE_OTHER): Payer: Medicare Other | Admitting: *Deleted

## 2017-02-13 DIAGNOSIS — I63412 Cerebral infarction due to embolism of left middle cerebral artery: Secondary | ICD-10-CM | POA: Diagnosis not present

## 2017-02-16 NOTE — Progress Notes (Signed)
Carelink Summary Report / Loop Recorder 

## 2017-02-20 LAB — CUP PACEART REMOTE DEVICE CHECK
Date Time Interrogation Session: 20180623014254
MDC IDC PG IMPLANT DT: 20160304

## 2017-03-16 ENCOUNTER — Ambulatory Visit (INDEPENDENT_AMBULATORY_CARE_PROVIDER_SITE_OTHER): Payer: Medicare Other | Admitting: *Deleted

## 2017-03-16 DIAGNOSIS — I63412 Cerebral infarction due to embolism of left middle cerebral artery: Secondary | ICD-10-CM

## 2017-03-17 NOTE — Progress Notes (Signed)
Carelink Summary Report / Loop Recorder 

## 2017-03-30 LAB — CUP PACEART REMOTE DEVICE CHECK
Date Time Interrogation Session: 20180723021132
Implantable Pulse Generator Implant Date: 20160304

## 2017-03-30 NOTE — Progress Notes (Addendum)
[facility] summary report received. Battery status OK. Normal device function. No new symptom episodes, tachy episodes, brady, or pause episodes. 35 AF 0.3% +warfarin. Monthly summary reports and ROV/PRN   Afi noted and on anticoagulation

## 2017-04-14 ENCOUNTER — Ambulatory Visit (INDEPENDENT_AMBULATORY_CARE_PROVIDER_SITE_OTHER): Payer: Self-pay | Admitting: *Deleted

## 2017-04-14 DIAGNOSIS — I63412 Cerebral infarction due to embolism of left middle cerebral artery: Secondary | ICD-10-CM

## 2017-04-15 NOTE — Progress Notes (Signed)
Carelink Summary Report / Loop Recorder 

## 2017-04-19 LAB — CUP PACEART REMOTE DEVICE CHECK
MDC IDC PG IMPLANT DT: 20160304
MDC IDC SESS DTM: 20180822024054

## 2017-05-14 ENCOUNTER — Ambulatory Visit (INDEPENDENT_AMBULATORY_CARE_PROVIDER_SITE_OTHER): Payer: Medicare Other | Admitting: *Deleted

## 2017-05-14 DIAGNOSIS — I63412 Cerebral infarction due to embolism of left middle cerebral artery: Secondary | ICD-10-CM | POA: Diagnosis not present

## 2017-05-15 LAB — CUP PACEART REMOTE DEVICE CHECK
Date Time Interrogation Session: 20180921033743
Implantable Pulse Generator Implant Date: 20160304

## 2017-05-15 NOTE — Progress Notes (Signed)
Carelink Summary Report / Loop Recorder 

## 2017-06-15 ENCOUNTER — Ambulatory Visit (INDEPENDENT_AMBULATORY_CARE_PROVIDER_SITE_OTHER): Payer: Medicare Other | Admitting: *Deleted

## 2017-06-15 DIAGNOSIS — I63412 Cerebral infarction due to embolism of left middle cerebral artery: Secondary | ICD-10-CM

## 2017-06-15 NOTE — Progress Notes (Signed)
Carelink Summary Report / Loop Recorder 

## 2017-06-18 LAB — CUP PACEART REMOTE DEVICE CHECK
Date Time Interrogation Session: 20181021033855
Implantable Pulse Generator Implant Date: 20160304

## 2017-07-13 ENCOUNTER — Ambulatory Visit (INDEPENDENT_AMBULATORY_CARE_PROVIDER_SITE_OTHER): Payer: Medicare Other | Admitting: *Deleted

## 2017-07-13 DIAGNOSIS — I63412 Cerebral infarction due to embolism of left middle cerebral artery: Secondary | ICD-10-CM

## 2017-07-14 NOTE — Progress Notes (Signed)
Carelink Summary Report / Loop Recorder 

## 2017-07-30 LAB — CUP PACEART REMOTE DEVICE CHECK
Date Time Interrogation Session: 20181120033912
Implantable Pulse Generator Implant Date: 20160304

## 2017-08-12 ENCOUNTER — Encounter: Payer: Medicare Other | Admitting: *Deleted

## 2017-08-12 ENCOUNTER — Telehealth: Payer: Self-pay | Admitting: Cardiology

## 2017-08-12 NOTE — Telephone Encounter (Signed)
Spoke w/ pt wife and requested that he send a manual transmission b/c his home monitor has not updated in at least 14 days. Pt wife informed me that pt deceased on 2017/08/29.

## 2017-08-25 DEATH — deceased

## 2018-01-07 IMAGING — DX DG CHEST 2V
2 series · 2 of 2 positions shown · non-contrast
Comparison: 11/25/2007

CLINICAL DATA: Pt from home with fever, weakness, and fatigue. Pt
states his symptoms began 3 days ago. Wife with same illness. Hx
HTN, CAD, MI x2

EXAM:
CHEST  2 VIEW

[chest lat]
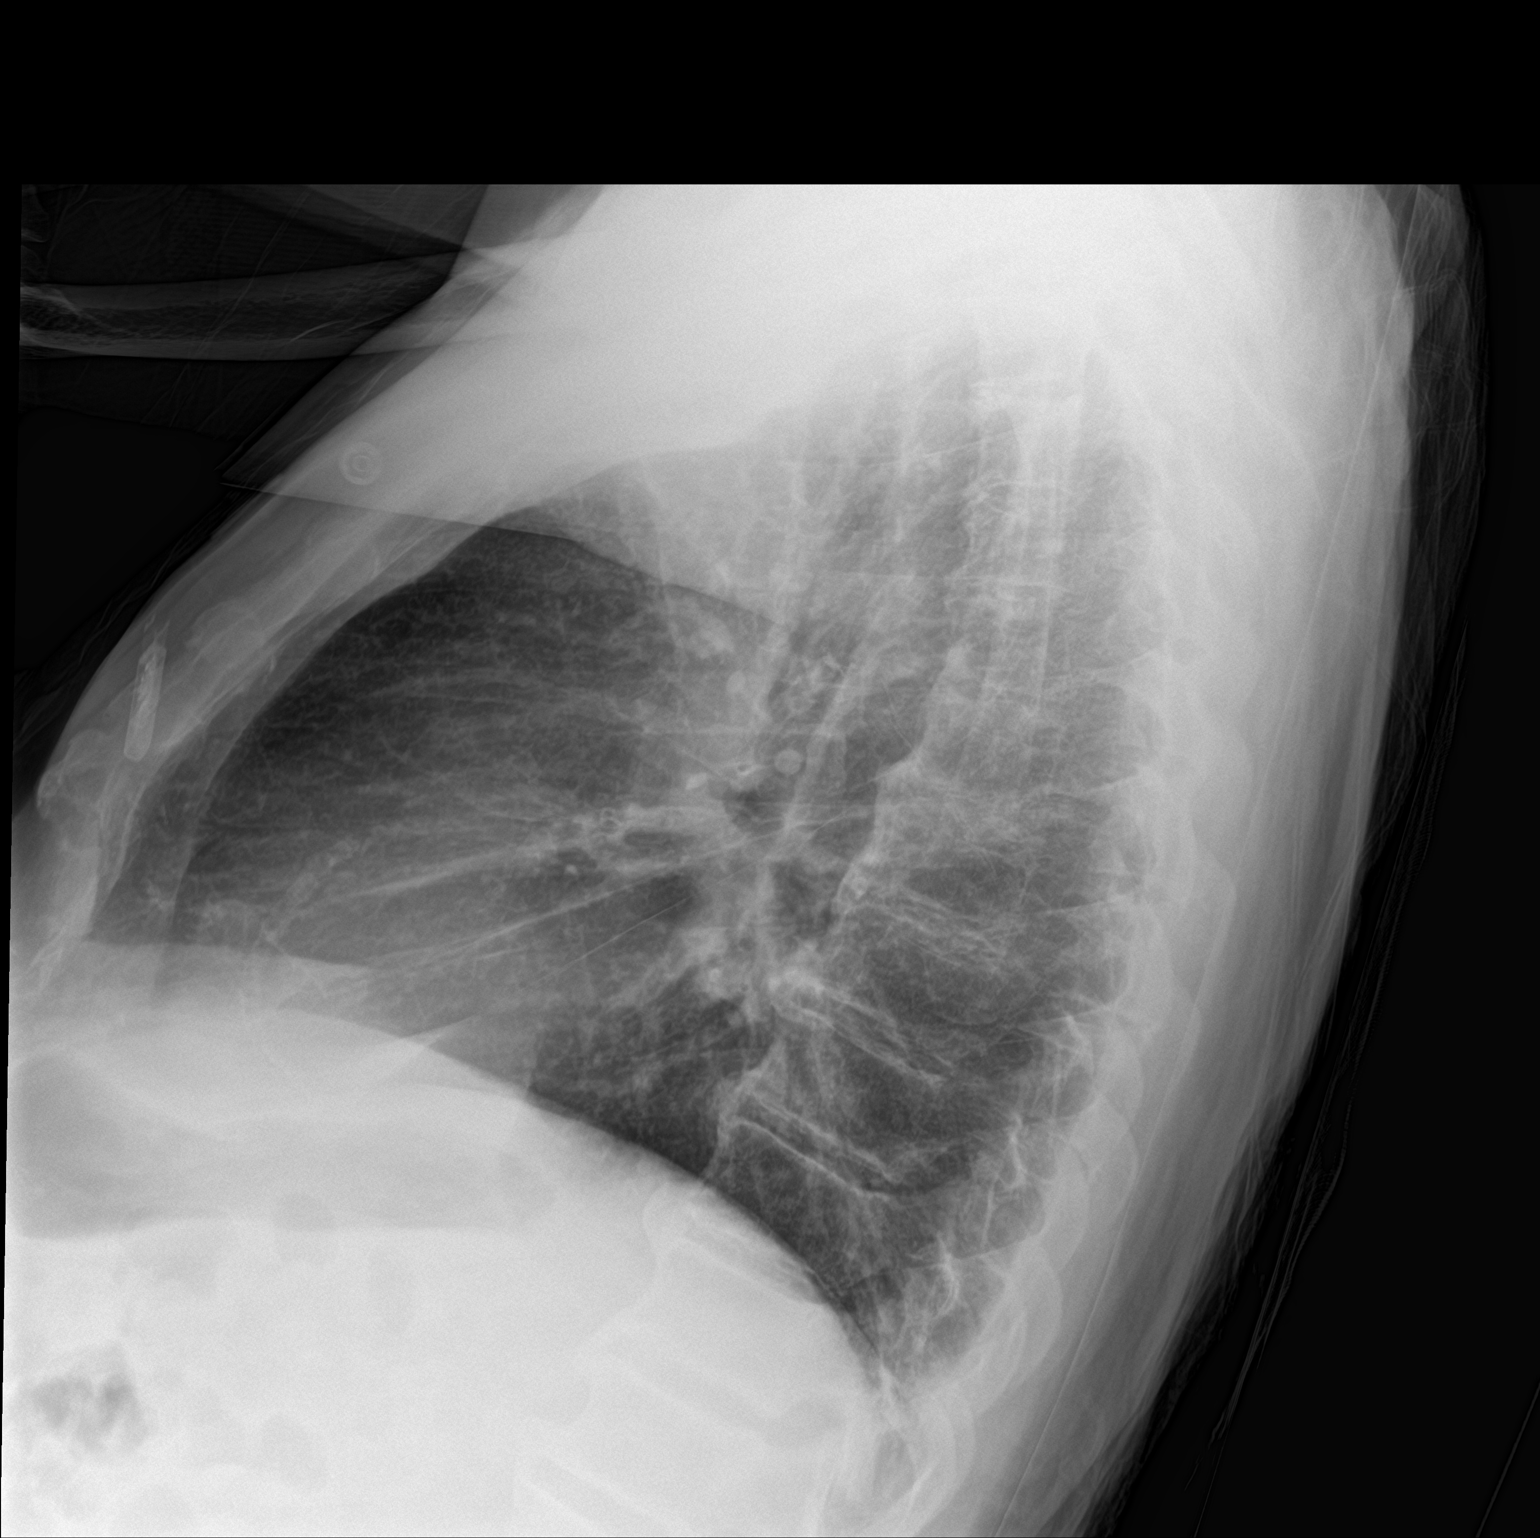

[chest ap]
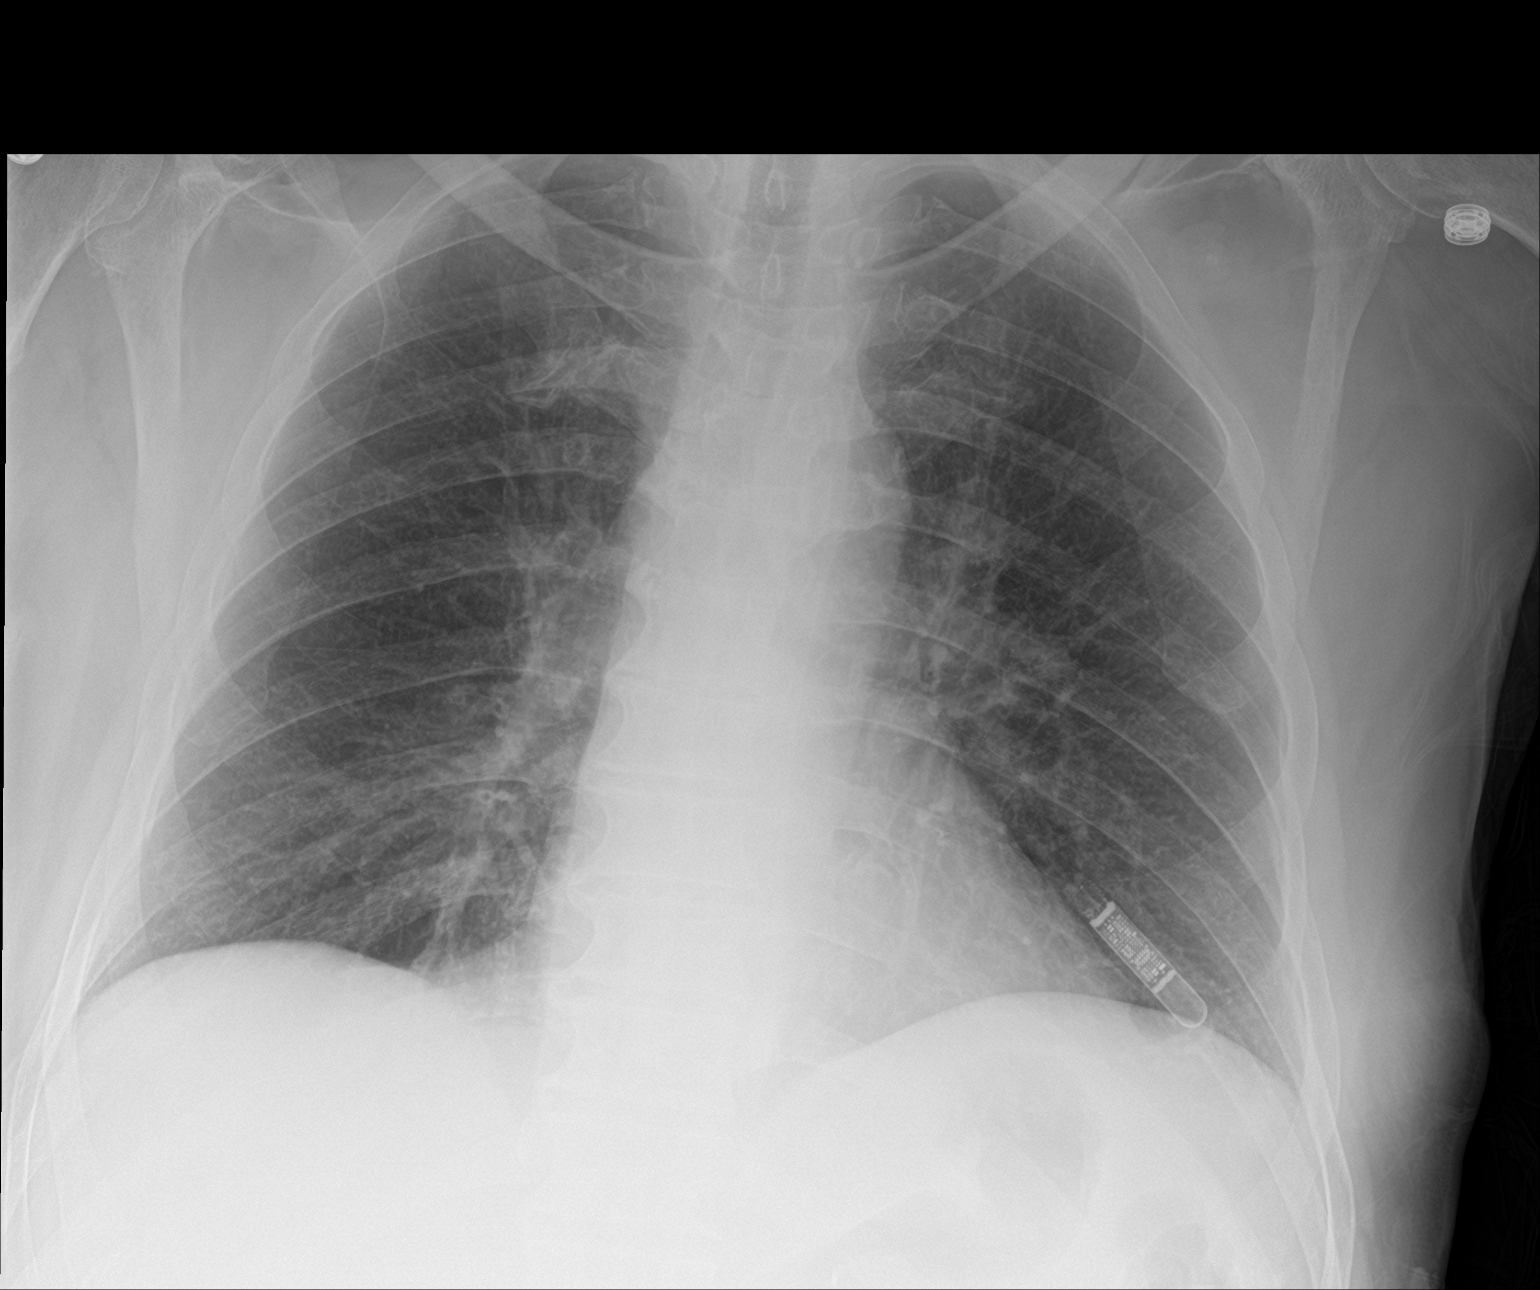

[2 of 2 positions shown; findings below may reference images not displayed]

FINDINGS: The heart size and mediastinal contours are within normal limits.
Both lungs are clear. No pleural effusion or pneumothorax.

Skeletal structures are intact.
IMPRESSION: No active cardiopulmonary disease.
